# Patient Record
Sex: Female | Born: 1975 | Race: Black or African American | Hispanic: No | Marital: Single | Smoking: Former smoker
Health system: Southern US, Community
[De-identification: ages and names within clinical notes are randomized; demographics above are authoritative.]

## PROBLEM LIST (undated history)

## (undated) DIAGNOSIS — Z8619 Personal history of other infectious and parasitic diseases: Secondary | ICD-10-CM

## (undated) DIAGNOSIS — G43909 Migraine, unspecified, not intractable, without status migrainosus: Secondary | ICD-10-CM

## (undated) DIAGNOSIS — F329 Major depressive disorder, single episode, unspecified: Secondary | ICD-10-CM

## (undated) DIAGNOSIS — K219 Gastro-esophageal reflux disease without esophagitis: Secondary | ICD-10-CM

## (undated) DIAGNOSIS — T7840XA Allergy, unspecified, initial encounter: Secondary | ICD-10-CM

## (undated) DIAGNOSIS — I739 Peripheral vascular disease, unspecified: Secondary | ICD-10-CM

## (undated) DIAGNOSIS — N809 Endometriosis, unspecified: Secondary | ICD-10-CM

## (undated) HISTORY — DX: Gastro-esophageal reflux disease without esophagitis: K21.9

## (undated) HISTORY — DX: Personal history of other infectious and parasitic diseases: Z86.19

## (undated) HISTORY — DX: Major depressive disorder, single episode, unspecified: F32.9

## (undated) HISTORY — DX: Endometriosis, unspecified: N80.9

## (undated) HISTORY — PX: APPENDECTOMY: SHX54

## (undated) HISTORY — PX: BOWEL RESECTION: SHX1257

## (undated) HISTORY — DX: Migraine, unspecified, not intractable, without status migrainosus: G43.909

## (undated) HISTORY — DX: Peripheral vascular disease, unspecified: I73.9

## (undated) HISTORY — PX: OVARIAN CYST REMOVAL: SHX89

---

## 1997-11-13 ENCOUNTER — Other Ambulatory Visit: Admission: RE | Admit: 1997-11-13 | Discharge: 1997-11-13 | Payer: Self-pay | Admitting: Family Medicine

## 1998-07-12 ENCOUNTER — Other Ambulatory Visit: Admission: RE | Admit: 1998-07-12 | Discharge: 1998-07-12 | Payer: Self-pay | Admitting: *Deleted

## 1998-09-28 ENCOUNTER — Other Ambulatory Visit: Admission: RE | Admit: 1998-09-28 | Discharge: 1998-09-28 | Payer: Self-pay | Admitting: Obstetrics and Gynecology

## 1998-11-12 ENCOUNTER — Other Ambulatory Visit: Admission: RE | Admit: 1998-11-12 | Discharge: 1998-11-12 | Payer: Self-pay | Admitting: Obstetrics and Gynecology

## 1999-02-20 ENCOUNTER — Encounter (INDEPENDENT_AMBULATORY_CARE_PROVIDER_SITE_OTHER): Payer: Self-pay | Admitting: Specialist

## 1999-02-20 ENCOUNTER — Other Ambulatory Visit: Admission: RE | Admit: 1999-02-20 | Discharge: 1999-02-20 | Payer: Self-pay | Admitting: Obstetrics and Gynecology

## 2001-03-11 ENCOUNTER — Other Ambulatory Visit: Admission: RE | Admit: 2001-03-11 | Discharge: 2001-03-11 | Payer: Self-pay | Admitting: Gynecology

## 2001-08-23 ENCOUNTER — Other Ambulatory Visit: Admission: RE | Admit: 2001-08-23 | Discharge: 2001-08-23 | Payer: Self-pay | Admitting: *Deleted

## 2008-08-25 LAB — CONVERTED CEMR LAB: Pap Smear: ABNORMAL

## 2009-07-05 ENCOUNTER — Ambulatory Visit: Admission: RE | Admit: 2009-07-05 | Discharge: 2009-07-05 | Payer: Self-pay | Admitting: Gynecologic Oncology

## 2009-07-10 ENCOUNTER — Encounter: Payer: Self-pay | Admitting: Obstetrics & Gynecology

## 2009-07-10 ENCOUNTER — Inpatient Hospital Stay (HOSPITAL_COMMUNITY): Admission: RE | Admit: 2009-07-10 | Discharge: 2009-07-13 | Payer: Self-pay | Admitting: Obstetrics & Gynecology

## 2009-07-10 ENCOUNTER — Encounter: Payer: Self-pay | Admitting: Gynecology

## 2009-08-21 ENCOUNTER — Ambulatory Visit: Admission: RE | Admit: 2009-08-21 | Discharge: 2009-08-21 | Payer: Self-pay | Admitting: Gynecology

## 2009-10-18 ENCOUNTER — Ambulatory Visit: Payer: Self-pay | Admitting: Family Medicine

## 2009-10-18 DIAGNOSIS — F329 Major depressive disorder, single episode, unspecified: Secondary | ICD-10-CM

## 2009-10-18 DIAGNOSIS — N809 Endometriosis, unspecified: Secondary | ICD-10-CM | POA: Insufficient documentation

## 2009-10-18 DIAGNOSIS — G43909 Migraine, unspecified, not intractable, without status migrainosus: Secondary | ICD-10-CM | POA: Insufficient documentation

## 2009-10-18 DIAGNOSIS — I739 Peripheral vascular disease, unspecified: Secondary | ICD-10-CM

## 2009-10-18 DIAGNOSIS — F3289 Other specified depressive episodes: Secondary | ICD-10-CM

## 2009-10-18 DIAGNOSIS — K219 Gastro-esophageal reflux disease without esophagitis: Secondary | ICD-10-CM | POA: Insufficient documentation

## 2009-10-18 HISTORY — DX: Migraine, unspecified, not intractable, without status migrainosus: G43.909

## 2009-10-18 HISTORY — DX: Endometriosis, unspecified: N80.9

## 2009-10-18 HISTORY — DX: Other specified depressive episodes: F32.89

## 2009-10-18 HISTORY — DX: Peripheral vascular disease, unspecified: I73.9

## 2009-10-18 HISTORY — DX: Gastro-esophageal reflux disease without esophagitis: K21.9

## 2009-10-18 HISTORY — DX: Major depressive disorder, single episode, unspecified: F32.9

## 2009-10-19 ENCOUNTER — Ambulatory Visit: Payer: Self-pay | Admitting: Vascular Surgery

## 2009-10-19 ENCOUNTER — Telehealth: Payer: Self-pay | Admitting: Family Medicine

## 2009-10-19 ENCOUNTER — Encounter: Payer: Self-pay | Admitting: Family Medicine

## 2009-12-06 ENCOUNTER — Ambulatory Visit: Payer: Self-pay | Admitting: Surgery

## 2009-12-06 ENCOUNTER — Ambulatory Visit (HOSPITAL_COMMUNITY): Admission: RE | Admit: 2009-12-06 | Discharge: 2009-12-06 | Payer: Self-pay | Admitting: Surgery

## 2010-06-27 LAB — HM PAP SMEAR: HM Pap smear: NEGATIVE

## 2010-08-25 DIAGNOSIS — O2230 Deep phlebothrombosis in pregnancy, unspecified trimester: Secondary | ICD-10-CM

## 2010-08-25 HISTORY — DX: Deep phlebothrombosis in pregnancy, unspecified trimester: O22.30

## 2010-09-24 NOTE — Progress Notes (Signed)
Summary: [facility] update  Phone Note From Other Clinic   Caller: Referral Coordinator Summary of Call: Pt is at [facility] now, received a call that lab study was abnormal, asking permission to see Dr Arbie Cookey.  Per notes in Orders, gave verbal OK for pt to see Dr Arbie Cookey Initial call taken by: Sid Falcon LPN,  October 19, 2009 2:10 PM  Follow-up for Phone Call        noted Follow-up by: [name] MD,  October 19, 2009 2:16 PM

## 2010-09-24 NOTE — Assessment & Plan Note (Signed)
Summary: BRAND NEW PT/TO EST/CJR   Vital Signs:  Patient profile:   35 year old female Menstrual status:  irregular LMP:     10/01/2009 Height:      61.75 inches Weight:      183 pounds BMI:     33.86 Temp:     99.1 degrees F oral Pulse rate:   80 / minute Pulse rhythm:   regular Resp:     12 per minute BP sitting:   102 / 70  (left arm) Cuff size:   regular  Vitals Entered By: Sid Falcon LPN (October 18, 2009 10:32 AM)  Nutrition Counseling: Patient's BMI is greater than 25 and therefore counseled on weight management options. CC: New pt to establish, Depression LMP (date): 10/01/2009     Menstrual Status irregular Enter LMP: 10/01/2009 Last PAP Result abnormal   History of Present Illness: New patient visit to establish care.  Past medical history significant for migraine headaches, intermittent GERD symptoms. Remote history of HPV, recurrent depression, and endometriosis. Patient had bilateral ovarian cysts removed in November of 2010 and relates had ?partial bowel resection secondary to endometriosis at the same time. Reportedly had some involvement of the appendix as well  and that was removed.  Patient takes Celexa 20 mg daily for depression and is on birth control per her gynecologist. No history of any blood clot disorder. Smokes less than one half pack cigarettes per day.  Acute issue is approximately one-week history of pain and some color changes right second and fifth digits. She has noticed some purplish changes distal to the DIP joint only involving those 2 fingers. Has never had any history of Raynaud's and has not noted any white color changes. Has noted both of those digits feel cooler than the others and are painful at times. Reports remote history of heart murmur in childhood but none since. No recent chest pains.  No R arm pain.  Family history significant for father having coronary disease in his 80s. Both parents have type 2 diabetes. Father with  kidney disease presumably related to diabetes and hypertension.  Social history that she is single. No children. Attends community college. Smokes less than one half pack cigarettes per day. Occasional alcohol use.  Migraine headaches brought on by stress.  Usually relieved with Alleve. GERD exacerbated by spicy foods.  Also sig caffeine consumption.  Pepcid relieves.  Depression History:      Positive alarm features for depression include fatigue (loss of energy).  However, she denies significant weight loss, significant weight gain, psychomotor agitation, psychomotor retardation, impaired concentration (indecisiveness), and recurrent thoughts of death or suicide.        Preventive Screening-Counseling & Management  Alcohol-Tobacco     Smoking Status: current     Packs/Day: 0.25     Year Started: 1998  Allergies (verified): No Known Drug Allergies  Past History:  Family History: Last updated: 10/18/2009 Father - Type 2 DM, HTN, CAD 50s, Kidney failure, Prostate Cancer Mother -HTN, Type 2 DM  Social History: Last updated: 10/18/2009 Single Current Smoker Alcohol use-yes Attends [employer]  Risk Factors: Smoking Status: current (10/18/2009) Packs/Day: 0.25 (10/18/2009)  Past Medical History: Endometriosis  GYN Dr Stefano Gaul Migraines GERD Recurrent Depression Hx HPV Depression  Past Surgical History: Appendectomy Bil Ovarian cyst excision 2010 ?Partial bowel resection sec to endometriosis Tonsillectomy  Family History: Father - Type 2 DM, HTN, CAD 38s, Kidney failure, Prostate Cancer Mother -HTN, Type 2 DM  Social History: Single Current Smoker Alcohol  use-yes Attends [employer] Smoking Status:  current Packs/Day:  0.25  Review of Systems  The patient denies anorexia, fever, weight loss, chest pain, syncope, dyspnea on exertion, peripheral edema, prolonged cough, headaches, hemoptysis, abdominal pain, melena, hematochezia, severe indigestion/heartburn, and  hematuria.    Physical Exam  General:  Well-developed,well-nourished,in no acute distress; alert,appropriate and cooperative throughout examination Head:  Normocephalic and atraumatic without obvious abnormalities. No apparent alopecia or balding. Eyes:  pupils equal, pupils round, and pupils reactive to light.   Ears:  External ear exam shows no significant lesions or deformities.  Otoscopic examination reveals clear canals, tympanic membranes are intact bilaterally without bulging, retraction, inflammation or discharge. Hearing is grossly normal bilaterally. Mouth:  Oral mucosa and oropharynx without lesions or exudates.  Teeth in good repair. Neck:  No deformities, masses, or tenderness noted. Lungs:  Normal respiratory effort, chest expands symmetrically. Lungs are clear to auscultation, no crackles or wheezes. Heart:  Normal rate and regular rhythm. S1 and S2 normal without gallop, murmur, click, rub or other extra sounds. Msk:  No deformity or scoliosis noted of thoracic or lumbar spine.   Pulses:  R radial normal and L radial normal.   Extremities:  right hand reveals some purplish changes involving the second and fifth digits distal to the DIP joint. She has some delayed capillary refill in those digits compared to the others they're slightly cool to touch and minimally tender. Good radial pulses. Brachial pulses are 2+ bilaterally. Neurologic:  alert & oriented X3, cranial nerves II-XII intact, and strength normal in all extremities.   Skin:  Intact without suspicious lesions or rashes Psych:  normally interactive, good eye contact, not anxious appearing, and not depressed appearing.     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  ?microemboli.  No heart murmur or other suspicion of cardiac source. Pt is encouraged to stop smoking. Refer CVTS. Orders: [facility] (Vascular)  Problem # 2:   DEPRESSION (ICD-311) recurrent, cont Celexa. Her updated medication list for this problem includes:    Celexa 20 Mg Tabs (Citalopram hydrobromide) ..... Qd  Problem # 3:  MIGRAINE HEADACHE (ICD-346.90) discussed triggers. Her updated medication list for this problem includes:    Tramadol Hcl 50 Mg Tabs (Tramadol hcl) ..... Every 4-6 hours as needed pain  Problem # 4:  GERD (ICD-530.81) Discussed lifestyle modification-smoking cessation, caffeine reduction, aggravating foods. Her updated medication list for this problem includes:    Pepcid Ac 10 Mg Tabs (Famotidine) .Marland Kitchen... As needed  Problem # 5:  ENDOMETRIOSIS (ICD-617.9)  Complete Medication List: 1)  Celexa 20 Mg Tabs (Citalopram hydrobromide) .... Qd 2)  Ciprofloxacin Hcl 500 Mg Tabs (Ciprofloxacin hcl) .... Two times a day 3)  Tramadol Hcl 50 Mg Tabs (Tramadol hcl) .... Every 4-6 hours as needed pain 4)  Pepcid Ac 10 Mg Tabs (Famotidine) .... As needed   Patient Instructions: 1)  Stop smoking 2)  start 81 mg aspirin one daily 3)  We will be calling you regarding referral to vascular specialist  Preventive Care Screening  Pap Smear:    Date:  08/25/2008    Results:  abnormal   Last Tetanus Booster:    Date:  08/25/1998    Results:  Historical      Colonscopy 2010    Immunization History:  Pneumovax Immunization History:    Pneumovax:  historical (08/26/2007)

## 2010-11-13 LAB — PREGNANCY, URINE: Preg Test, Ur: NEGATIVE

## 2010-11-13 LAB — POCT I-STAT, CHEM 8
Calcium, Ion: 1.16 mmol/L (ref 1.12–1.32)
HCT: 41 % (ref 36.0–46.0)
TCO2: 22 mmol/L (ref 0–100)

## 2010-11-27 LAB — COMPREHENSIVE METABOLIC PANEL
AST: 27 U/L (ref 0–37)
Albumin: 3.4 g/dL — ABNORMAL LOW (ref 3.5–5.2)
BUN: 4 mg/dL — ABNORMAL LOW (ref 6–23)
Calcium: 9.2 mg/dL (ref 8.4–10.5)
Creatinine, Ser: 1.01 mg/dL (ref 0.4–1.2)
GFR calc Af Amer: >60 mL/min (ref >60)

## 2010-11-27 LAB — DIFFERENTIAL
Basophils Absolute: 0.1 10*3/uL (ref 0.0–0.1)
Basophils Relative: 1 % (ref 0–1)
Lymphocytes Relative: 44 % (ref 12–46)
Monocytes Relative: 7 % (ref 3–12)
Neutro Abs: 3.3 10*3/uL (ref 1.7–7.7)
Neutrophils Relative %: 46 % (ref 43–77)

## 2010-11-27 LAB — TYPE AND SCREEN
ABO/RH(D): A POS
Antibody Screen: NEGATIVE

## 2010-11-27 LAB — CBC
HCT: 40.4 % (ref 36.0–46.0)
Hemoglobin: 10.9 g/dL — ABNORMAL LOW (ref 12.0–15.0)
Hemoglobin: 9.4 g/dL — ABNORMAL LOW (ref 12.0–15.0)
MCHC: 33.8 g/dL (ref 30.0–36.0)
MCHC: 33.5 g/dL (ref 30.0–36.0)
MCV: 93.6 fL (ref 78.0–100.0)
MCV: 95.5 fL (ref 78.0–100.0)
Platelets: 202 10*3/uL (ref 150–400)
Platelets: 229 10*3/uL (ref 150–400)
RBC: 4.31 MIL/uL (ref 3.87–5.11)
RDW: 14.1 % (ref 11.5–15.5)
RDW: 14.7 % (ref 11.5–15.5)
RDW: 14.4 % (ref 11.5–15.5)
WBC: 11.5 10*3/uL — ABNORMAL HIGH (ref 4.0–10.5)

## 2010-11-27 LAB — BASIC METABOLIC PANEL
CO2: 29 mEq/L (ref 19–32)
Calcium: 8.6 mg/dL (ref 8.4–10.5)
Calcium: 8.8 mg/dL (ref 8.4–10.5)
Creatinine, Ser: 0.82 mg/dL (ref 0.4–1.2)
Creatinine, Ser: 0.84 mg/dL (ref 0.4–1.2)
GFR calc non Af Amer: >60 mL/min (ref >60)
Glucose, Bld: 122 mg/dL — ABNORMAL HIGH (ref 70–99)
Glucose, Bld: 136 mg/dL — ABNORMAL HIGH (ref 70–99)
Sodium: 137 mEq/L (ref 135–145)
Sodium: 139 mEq/L (ref 135–145)

## 2010-11-27 LAB — PREGNANCY, URINE: Preg Test, Ur: NEGATIVE

## 2010-12-09 ENCOUNTER — Emergency Department (HOSPITAL_COMMUNITY)
Admission: EM | Admit: 2010-12-09 | Discharge: 2010-12-10 | Disposition: A | Discharge status: Home / Self Care | Payer: Self-pay | Attending: Emergency Medicine | Admitting: Emergency Medicine

## 2010-12-09 ENCOUNTER — Emergency Department (HOSPITAL_COMMUNITY): Payer: Self-pay

## 2010-12-09 DIAGNOSIS — M25473 Effusion, unspecified ankle: Secondary | ICD-10-CM | POA: Insufficient documentation

## 2010-12-09 DIAGNOSIS — F17200 Nicotine dependence, unspecified, uncomplicated: Secondary | ICD-10-CM | POA: Insufficient documentation

## 2010-12-09 DIAGNOSIS — M25476 Effusion, unspecified foot: Secondary | ICD-10-CM | POA: Insufficient documentation

## 2010-12-09 DIAGNOSIS — M25579 Pain in unspecified ankle and joints of unspecified foot: Secondary | ICD-10-CM | POA: Insufficient documentation

## 2010-12-10 ENCOUNTER — Ambulatory Visit (HOSPITAL_COMMUNITY)
Admission: RE | Admit: 2010-12-10 | Discharge: 2010-12-10 | Disposition: A | Discharge status: Home / Self Care | Payer: Self-pay | Source: Ambulatory Visit | Attending: Emergency Medicine | Admitting: Emergency Medicine

## 2010-12-10 ENCOUNTER — Observation Stay (HOSPITAL_COMMUNITY)
Admission: EM | Admit: 2010-12-10 | Discharge: 2010-12-10 | Disposition: A | Discharge status: Home / Self Care | Payer: Self-pay | Attending: Emergency Medicine | Admitting: Emergency Medicine

## 2010-12-10 DIAGNOSIS — I824Z9 Acute embolism and thrombosis of unspecified deep veins of unspecified distal lower extremity: Principal | ICD-10-CM | POA: Insufficient documentation

## 2010-12-10 DIAGNOSIS — M79609 Pain in unspecified limb: Secondary | ICD-10-CM

## 2010-12-10 DIAGNOSIS — M7989 Other specified soft tissue disorders: Secondary | ICD-10-CM | POA: Insufficient documentation

## 2010-12-10 LAB — DIFFERENTIAL
Basophils Absolute: 0 10*3/uL (ref 0.0–0.1)
Basophils Relative: 0 % (ref 0–1)
Lymphocytes Relative: 33 % (ref 12–46)
Monocytes Absolute: 1.2 10*3/uL — ABNORMAL HIGH (ref 0.1–1.0)
Neutro Abs: 7.4 10*3/uL (ref 1.7–7.7)
Neutrophils Relative %: 57 % (ref 43–77)

## 2010-12-10 LAB — BASIC METABOLIC PANEL
BUN: 11 mg/dL (ref 6–23)
Chloride: 106 mEq/L (ref 96–112)
GFR calc Af Amer: >60 mL/min (ref >60)
GFR calc non Af Amer: >60 mL/min (ref >60)
Potassium: 3.7 mEq/L (ref 3.5–5.1)
Sodium: 139 mEq/L (ref 135–145)

## 2010-12-10 LAB — PROTIME-INR
INR: 1.1 (ref 0.00–1.49)
Prothrombin Time: 14.4 seconds (ref 11.6–15.2)

## 2010-12-10 LAB — CBC
HCT: 39.8 % (ref 36.0–46.0)
Hemoglobin: 13.9 g/dL (ref 12.0–15.0)
MCHC: 34.9 g/dL (ref 30.0–36.0)
RBC: 4.6 MIL/uL (ref 3.87–5.11)

## 2010-12-10 LAB — D-DIMER, QUANTITATIVE: D-Dimer, Quant: 2.9 ug/mL-FEU — ABNORMAL HIGH (ref 0.00–0.48)

## 2010-12-11 ENCOUNTER — Encounter: Payer: Self-pay | Admitting: Family Medicine

## 2010-12-12 ENCOUNTER — Encounter: Payer: Self-pay | Admitting: Family Medicine

## 2010-12-12 ENCOUNTER — Ambulatory Visit (INDEPENDENT_AMBULATORY_CARE_PROVIDER_SITE_OTHER): Payer: Self-pay | Admitting: Family Medicine

## 2010-12-12 VITALS — BP 100/78 | Temp 98.5°F | Ht 61.75 in | Wt 180.0 lb

## 2010-12-12 DIAGNOSIS — I82409 Acute embolism and thrombosis of unspecified deep veins of unspecified lower extremity: Secondary | ICD-10-CM

## 2010-12-12 LAB — POCT INR: INR: 2.6

## 2010-12-12 MED ORDER — HYDROCODONE-ACETAMINOPHEN 5-325 MG PO TABS: 1.0000 | ORAL_TABLET | Freq: Four times a day (QID) | ORAL | Status: DC | PRN

## 2010-12-12 NOTE — Progress Notes (Signed)
  Subjective:    Patient ID: Terri [name], female    DOB: 12-Apr-1976, 35 y.o.   MRN: [id number]  HPI Patient seen for post hospital followup. She relates a history that around April 6 she noticed left leg edema and pain. She tried treating this with ibuprofen with minimal improvement. Subsequently when this past Monday to [facility] confirmed left lower extremity DVT. No prior history of DVT though question of right arm embolic disease from a year ago. We had referred her to vascular surgeon and she eventually had aortic arch angiogram which showed no significant abnormalities.  Moderate pain left lower extremity. Improved with hydrocodone. Needs refills.  Patient has risk factors for coagulopathy including prior contraceptive use and smoking. Her contraceptives were being used for endometriosis and a coarse discontinued during hospitalization. She still smokes. Started Coumadin 5 mg daily with third dose today. Also taking Lovenox. No bleeding complications. Denies any shortness of breath, chest pain, or pleuritic pain. No hemoptysis.   Review of Systems  Respiratory: Negative for cough and shortness of breath.   Cardiovascular: Positive for leg swelling. Negative for chest pain and palpitations.  Gastrointestinal: Negative for abdominal pain.  Genitourinary: Negative for hematuria.  Neurological: Negative for dizziness, syncope, light-headedness and headaches.  Psychiatric/Behavioral: Negative for confusion.       Objective:   Physical Exam  Constitutional: She is oriented to person, place, and time. She appears well-developed and well-nourished.  HENT:  Head: Normocephalic and atraumatic.  Eyes: Pupils are equal, round, and reactive to light.  Neck: Neck supple. No thyromegaly present.  Cardiovascular: Normal rate, regular rhythm and normal heart sounds.   No murmur heard. Pulmonary/Chest: Breath sounds normal. No respiratory distress. She has no wheezes. She has no  rales.  Musculoskeletal: She exhibits edema and tenderness.       Patient has some edema left lower extremity compared to right she states this is diminished compared to one week ago. Minimal left calf tenderness. No color changes  Lymphadenopathy:    She has no cervical adenopathy.  Neurological: She is alert and oriented to person, place, and time. No cranial nerve deficit.          Assessment & Plan:  Patient presents with left lower extremity DVT. Recheck INR today. Patient knows not to start back contraception and will discuss with her gynecologist. She is strongly encouraged to stop smoking. Refill hydrocodone to use as needed for pain. Planned recheck in 4 days

## 2010-12-12 NOTE — Patient Instructions (Signed)
Coumadin dose- 5mg . And 2.5mg  Alternating.

## 2010-12-16 ENCOUNTER — Encounter: Payer: Self-pay | Admitting: Family Medicine

## 2010-12-16 ENCOUNTER — Ambulatory Visit (INDEPENDENT_AMBULATORY_CARE_PROVIDER_SITE_OTHER): Payer: Self-pay | Admitting: Family Medicine

## 2010-12-16 VITALS — BP 110/80 | Temp 98.5°F | Wt 184.0 lb

## 2010-12-16 DIAGNOSIS — I82409 Acute embolism and thrombosis of unspecified deep veins of unspecified lower extremity: Secondary | ICD-10-CM

## 2010-12-16 LAB — POCT INR: INR: 2

## 2010-12-16 NOTE — Patient Instructions (Addendum)
Stop Lovenox.  Continue Coumadin 2.5 mg every Monday, Wednesday, and Friday and 5 mg all other days. Follow up one week for repeat Protime.

## 2010-12-16 NOTE — Progress Notes (Signed)
  Subjective:    Patient ID: Terri [name], female    DOB: [birth year], 35 y.o.   MRN: [id number]  HPI Patient seen followed DVT left lower extremity. She was started on Coumadin 5 mg daily and after just 3 days INR 2.6. We reduced her dosage and decided to keep her on Lovenox over the weekend and plan to check INR today. No significant bleeding complications other than some mild bruising from injection site. Great improvement in left lower extremity pain which is at least 50% better than last week. She is starting to reduce her hydrocodone use. No dyspnea or pleuritic pain.   Review of Systems  Constitutional: Negative for fever and chills.  Respiratory: Negative for shortness of breath.   Cardiovascular: Negative for chest pain and palpitations.  Gastrointestinal: Negative for nausea, vomiting, diarrhea, blood in stool and anal bleeding.  Genitourinary: Negative for hematuria.  Hematological: Does not bruise/bleed easily.       Objective:   Physical Exam  Constitutional: She appears well-developed and well-nourished.  Cardiovascular: Normal rate and regular rhythm.   Pulmonary/Chest: Effort normal and breath sounds normal. No respiratory distress. She has no wheezes. She has no rales.  Musculoskeletal:       She has edema as expected left lower extremity which is stable compared to last week. Minimal left calf tenderness          Assessment & Plan:  Acute DVT left lower extremity likely related to risk factors of oral contraceptive use and smoking. The patient is still smoking but has scaled back. She is off oral contraceptives. Recheck INR today. Hopefully can discontinue her Lovenox

## 2010-12-23 ENCOUNTER — Ambulatory Visit (INDEPENDENT_AMBULATORY_CARE_PROVIDER_SITE_OTHER): Payer: Self-pay | Admitting: Family Medicine

## 2010-12-23 DIAGNOSIS — I82409 Acute embolism and thrombosis of unspecified deep veins of unspecified lower extremity: Secondary | ICD-10-CM

## 2010-12-23 NOTE — Patient Instructions (Signed)
Same dose 

## 2010-12-30 ENCOUNTER — Ambulatory Visit (INDEPENDENT_AMBULATORY_CARE_PROVIDER_SITE_OTHER): Payer: Self-pay | Admitting: Family Medicine

## 2010-12-30 ENCOUNTER — Other Ambulatory Visit: Payer: Self-pay | Admitting: *Deleted

## 2010-12-30 DIAGNOSIS — I82409 Acute embolism and thrombosis of unspecified deep veins of unspecified lower extremity: Secondary | ICD-10-CM

## 2010-12-30 MED ORDER — WARFARIN SODIUM 5 MG PO TABS: 5.0000 mg | ORAL_TABLET | Freq: Every day | ORAL | Status: DC

## 2010-12-30 NOTE — Patient Instructions (Signed)
5mg . Every day

## 2011-01-06 ENCOUNTER — Ambulatory Visit (INDEPENDENT_AMBULATORY_CARE_PROVIDER_SITE_OTHER): Payer: Self-pay | Admitting: Family Medicine

## 2011-01-06 DIAGNOSIS — I82409 Acute embolism and thrombosis of unspecified deep veins of unspecified lower extremity: Secondary | ICD-10-CM

## 2011-01-06 NOTE — Patient Instructions (Signed)
Same dose 

## 2011-01-07 NOTE — Consult Note (Signed)
NEW PATIENT CONSULTATION   Terri [name], Terri [name]  DOB:  01-13-1976                                       10/19/2009  XBMWU#:[id number]   Patient presents today for evaluation of cyanosis of her right second  and fifth finger.  She is an otherwise healthy 35 year old black female  who noted over the past several weeks to have painful tips of her second  and fifth finger and also her thumb and discoloration of her second and  fifth finger of her right hand.  She does not note any trauma to this  and does not have any repetitive motion-type activities.  She does not  have any history of prior embolic event or any cardiac disease.   Her past history is significant only for tonsillectomy in 1997.  She did  undergo endometriosis surgery with a partial bowel resection due to  involvement of endometrium in 2010.  She denies any cardiac or pulmonary  difficulty.   FAMILY HISTORY:  Positive for coronary bypass grafting in her father,  otherwise negative.   SOCIAL HISTORY:  She is married.  She does not have any children.  She  is a Consulting civil engineer.  She does have less than 1 pack of cigarettes per day  cigarettes consumption and does have social alcohol consumption.   REVIEW OF SYSTEMS:  Positive for some weight loss, weight gain, and loss  of appetite.  She is 5 feet 2 inches tall.  She weighs 180 pounds.  CARDIAC:  Negative.  PULMONARY:  Positive for productive cough.  GI:  Positive for reflux, diarrhea.  GU:  Urinary frequency.  VASCULAR:  Negative.  NEUROLOGIC:  Does have occasional headaches.  MUSCULOSKELETAL:  Occasional joint pain.  PSYCHIATRIC:  Depression and anxiety.  HEENT:  Negative.  HEMATOLOGIC:  No bleeding or clotting disorders.  She does have some  rashes on her hands from a skin standpoint.   PHYSICAL EXAMINATION:  A well-developed and well-nourished black female  appearing her stated age in no acute distress.  Blood pressure is  131/81, pulse 76,  respirations 18.  Her temperature is 97.9.  HEENT:  Normal.  Chest is clear bilaterally.  HEART:  Regular rate and rhythm  without murmurs.  Her radial and dorsalis pedis pulses are 2+  bilaterally.  Musculoskeletal shows no major deformities or cyanosis.  Neurologic:  No focal weakness or paresthesias.  Skin:  No ulcers or  rashes.  She does have cyanosis on the distal tips of her right second  and fifth finger, not on her thumb.   She underwent noninvasive vascular laboratory studies in our office, and  this revealed normal pressures and waveforms to the level of her wrist  in her radial and ulnar arteries bilaterally.  Her digital flow on the  left hand is normal.  She does have dampened digital flow in her right  second and fifth digits to correspond with the cyanosis.   I discussed this at length with the patient.  I explained that this does  appear to be embolic since it involves her second and fifth finger  versus primary occlusion in the finger itself.  I have recommended  further workup to rule out a more serious embolic source.  She will  initially undergo a 2-D echocardiogram to rule out cardiac source, and  assuming this is negative, we would  then proceed with an arch and right  arm arteriogram to rule out arch subclavian or more distal source for  emboli.  We will schedule this at her earliest convenience.     Larina Earthly, M.D.  Electronically Signed   TFE/MEDQ  D:  10/19/2009  T:  10/19/2009  Job:  [id number]   cc:   [name], M.D.

## 2011-02-06 ENCOUNTER — Ambulatory Visit (INDEPENDENT_AMBULATORY_CARE_PROVIDER_SITE_OTHER): Payer: Self-pay | Admitting: Family Medicine

## 2011-02-06 DIAGNOSIS — I82409 Acute embolism and thrombosis of unspecified deep veins of unspecified lower extremity: Secondary | ICD-10-CM

## 2011-02-06 LAB — POCT INR: INR: 1.6

## 2011-02-06 NOTE — Patient Instructions (Signed)
Take 10 mg only tomorrow then 5mg  everyday,check in 2 weeks

## 2011-02-20 ENCOUNTER — Ambulatory Visit (INDEPENDENT_AMBULATORY_CARE_PROVIDER_SITE_OTHER): Payer: Self-pay | Admitting: Family Medicine

## 2011-02-20 DIAGNOSIS — I82409 Acute embolism and thrombosis of unspecified deep veins of unspecified lower extremity: Secondary | ICD-10-CM

## 2011-02-20 LAB — POCT INR: INR: 1.9

## 2011-02-20 NOTE — Patient Instructions (Signed)
Same dose 

## 2011-03-20 ENCOUNTER — Ambulatory Visit: Payer: Self-pay

## 2011-04-02 ENCOUNTER — Ambulatory Visit (INDEPENDENT_AMBULATORY_CARE_PROVIDER_SITE_OTHER): Payer: Self-pay | Admitting: Family Medicine

## 2011-04-02 DIAGNOSIS — I82409 Acute embolism and thrombosis of unspecified deep veins of unspecified lower extremity: Secondary | ICD-10-CM

## 2011-04-02 LAB — POCT INR: INR: 1.6

## 2011-04-02 NOTE — Patient Instructions (Addendum)
7.5mg . Mondays and Fridays all other days 5mg .

## 2011-04-11 ENCOUNTER — Other Ambulatory Visit: Payer: Self-pay | Admitting: Family Medicine

## 2011-04-23 ENCOUNTER — Ambulatory Visit (INDEPENDENT_AMBULATORY_CARE_PROVIDER_SITE_OTHER): Payer: Self-pay | Admitting: Family Medicine

## 2011-04-23 DIAGNOSIS — I82409 Acute embolism and thrombosis of unspecified deep veins of unspecified lower extremity: Secondary | ICD-10-CM

## 2011-04-23 NOTE — Patient Instructions (Signed)
Same dose of coumadin 

## 2011-05-21 ENCOUNTER — Ambulatory Visit (INDEPENDENT_AMBULATORY_CARE_PROVIDER_SITE_OTHER): Payer: Self-pay | Admitting: Family Medicine

## 2011-05-21 DIAGNOSIS — I82409 Acute embolism and thrombosis of unspecified deep veins of unspecified lower extremity: Secondary | ICD-10-CM

## 2011-05-21 NOTE — Patient Instructions (Signed)
  Latest dosing instructions   Total Sun Mon Tue Wed Thu Fri Sat   40 5 mg 7.5 mg 5 mg 5 mg 5 mg 7.5 mg 5 mg    (5 mg1) (5 mg1.5) (5 mg1) (5 mg1) (5 mg1) (5 mg1.5) (5 mg1)        

## 2011-06-18 ENCOUNTER — Ambulatory Visit (INDEPENDENT_AMBULATORY_CARE_PROVIDER_SITE_OTHER): Payer: Self-pay | Admitting: Family Medicine

## 2011-06-18 DIAGNOSIS — I82409 Acute embolism and thrombosis of unspecified deep veins of unspecified lower extremity: Secondary | ICD-10-CM

## 2011-06-18 LAB — POCT INR: INR: 2.3

## 2011-06-18 NOTE — Patient Instructions (Signed)
  Latest dosing instructions   Total Sun Mon Tue Wed Thu Fri Sat   40 5 mg 7.5 mg 5 mg 5 mg 5 mg 7.5 mg 5 mg    (5 mg1) (5 mg1.5) (5 mg1) (5 mg1) (5 mg1) (5 mg1.5) (5 mg1)        

## 2011-07-22 ENCOUNTER — Other Ambulatory Visit (INDEPENDENT_AMBULATORY_CARE_PROVIDER_SITE_OTHER): Payer: Self-pay

## 2011-07-22 DIAGNOSIS — I82409 Acute embolism and thrombosis of unspecified deep veins of unspecified lower extremity: Secondary | ICD-10-CM

## 2011-07-22 DIAGNOSIS — Z0000 Encounter for general adult medical examination without abnormal findings: Secondary | ICD-10-CM

## 2011-07-22 LAB — POCT URINALYSIS DIPSTICK
Bilirubin, UA: NEGATIVE
Glucose, UA: NEGATIVE
Ketones, UA: NEGATIVE
Leukocytes, UA: NEGATIVE
Protein, UA: NEGATIVE
Spec Grav, UA: 1.025

## 2011-07-22 LAB — BASIC METABOLIC PANEL
CO2: 23 mEq/L (ref 19–32)
Chloride: 110 mEq/L (ref 96–112)
Sodium: 141 mEq/L (ref 135–145)

## 2011-07-22 LAB — CBC WITH DIFFERENTIAL/PLATELET
Basophils Relative: 0.3 % (ref 0.0–3.0)
Eosinophils Absolute: 0.2 10*3/uL (ref 0.0–0.7)
Eosinophils Relative: 2.1 % (ref 0.0–5.0)
Hemoglobin: 12 g/dL (ref 12.0–15.0)
Lymphocytes Relative: 37.4 % (ref 12.0–46.0)
MCHC: 32.4 g/dL (ref 30.0–36.0)
MCV: 85.1 fl (ref 78.0–100.0)
Neutro Abs: 5.2 10*3/uL (ref 1.4–7.7)
RBC: 4.36 Mil/uL (ref 3.87–5.11)
WBC: 9.8 10*3/uL (ref 4.5–10.5)

## 2011-07-22 LAB — HEPATIC FUNCTION PANEL
ALT: 18 U/L (ref 0–35)
Albumin: 3.7 g/dL (ref 3.5–5.2)
Alkaline Phosphatase: 80 U/L (ref 39–117)
Total Protein: 7.4 g/dL (ref 6.0–8.3)

## 2011-07-22 LAB — TSH: TSH: 1.31 u[IU]/mL (ref 0.35–5.50)

## 2011-07-22 LAB — LDL CHOLESTEROL, DIRECT: Direct LDL: 96 mg/dL

## 2011-07-22 LAB — LIPID PANEL: HDL: 35.6 mg/dL — ABNORMAL LOW (ref >39.00)

## 2011-07-22 LAB — POCT INR: INR: 2.2

## 2011-07-22 NOTE — Patient Instructions (Signed)
  Latest dosing instructions   Total Sun Mon Tue Wed Thu Fri Sat   40 5 mg 7.5 mg 5 mg 5 mg 5 mg 7.5 mg 5 mg    (5 mg1) (5 mg1.5) (5 mg1) (5 mg1) (5 mg1) (5 mg1.5) (5 mg1)        

## 2011-07-28 ENCOUNTER — Encounter: Payer: Self-pay | Admitting: Family Medicine

## 2011-07-28 ENCOUNTER — Ambulatory Visit (INDEPENDENT_AMBULATORY_CARE_PROVIDER_SITE_OTHER): Payer: Self-pay | Admitting: Family Medicine

## 2011-07-28 VITALS — BP 102/68 | HR 80 | Temp 98.4°F | Resp 12 | Ht 61.75 in | Wt 184.0 lb

## 2011-07-28 DIAGNOSIS — Z0000 Encounter for general adult medical examination without abnormal findings: Secondary | ICD-10-CM

## 2011-07-28 MED ORDER — TETANUS-DIPHTH-ACELL PERTUSSIS 5-2.5-18.5 LF-MCG/0.5 IM SUSP: 0.5000 mL | Freq: Once | INTRAMUSCULAR | Status: DC

## 2011-07-28 NOTE — Patient Instructions (Signed)
You need to quit smoking. Let me know if I can help in any way You need to establish regular exercise and work on weight loss Reduce saturated fat intake Consider omega-3 supplement 2-3 g daily

## 2011-07-28 NOTE — Progress Notes (Signed)
  Subjective:    Patient ID: Terri [name], female    DOB: 09-Oct-1975, 35 y.o.   MRN: [id number]  HPI  Complete physical. Patient sees gynecologist regularly for Pap smears. Last tetanus greater than 10 years ago. Declines flu vaccine. Smokes less than one half pack cigarettes per day. No regular exercise.  Patient had DVT lower extremity about 8 months ago. She was on oral contraception then.  Still smoking. No family history of coagulopathy. No bleeding complications on Coumadin.   Review of Systems  Constitutional: Negative for fever, activity change, appetite change, fatigue and unexpected weight change.  HENT: Negative for hearing loss, ear pain, sore throat and trouble swallowing.   Eyes: Negative for visual disturbance.  Respiratory: Negative for cough and shortness of breath.   Cardiovascular: Negative for chest pain and palpitations.  Gastrointestinal: Negative for abdominal pain, diarrhea, constipation and blood in stool.  Genitourinary: Negative for dysuria and hematuria.  Musculoskeletal: Negative for myalgias, back pain and arthralgias.  Skin: Negative for rash.  Neurological: Negative for dizziness, syncope and headaches.  Hematological: Negative for adenopathy.  Psychiatric/Behavioral: Negative for confusion and dysphoric mood.       Objective:   Physical Exam  Constitutional: She is oriented to person, place, and time. She appears well-developed and well-nourished.  HENT:  Head: Normocephalic and atraumatic.  Eyes: EOM are normal. Pupils are equal, round, and reactive to light.  Neck: Normal range of motion. Neck supple. No thyromegaly present.  Cardiovascular: Normal rate, regular rhythm and normal heart sounds.   No murmur heard. Pulmonary/Chest: Breath sounds normal. No respiratory distress. She has no wheezes. She has no rales.  Abdominal: Soft. Bowel sounds are normal. She exhibits no distension and no mass. There is no tenderness. There is no rebound  and no guarding.  Musculoskeletal: Normal range of motion. She exhibits no edema.  Lymphadenopathy:    She has no cervical adenopathy.  Neurological: She is alert and oriented to person, place, and time. She displays normal reflexes. No cranial nerve deficit.  Skin: No rash noted.  Psychiatric: She has a normal mood and affect. Her behavior is normal. Judgment and thought content normal.          Assessment & Plan:  Complete physical. Continue to [name] gynecologist. Tetanus booster given. Patient declines flu vaccine. Labs reviewed with patient. Mostly concerning for elevated triglycerides and low HDL.  Stressed importance of quitting smoking both in terms of her risk of heart disease and further blood clots. Also recommend regular aerobic exercise. Dietary factors discussed  History of DVT lower extremity DVT on contraception (at time of DVT dx).  Have recommended she try to quit smoking over the next couple months. Routine followup 3 months. Consider discontinuing Coumadin then.

## 2011-07-29 ENCOUNTER — Telehealth: Payer: Self-pay | Admitting: Family Medicine

## 2011-07-29 NOTE — Telephone Encounter (Signed)
Quit smoking. If chronic cough persists beyond one month chest x-ray

## 2011-07-29 NOTE — Telephone Encounter (Signed)
Pt was here yesterday to see Dr. Leonard Schwartz. States she forgot to tell him that whenever she wakes up, or gets hot throughout the day, she has coughing fits with a lot of clear mucus. She wants to know if this is normal, or if she can have medication for this, or what. She's a smoker, so that may be the problem. Please advise.

## 2011-07-29 NOTE — Telephone Encounter (Signed)
Pt informed and she was encouraged to schedule another OV to discuss smoking cesation options.  She will call to request CXR if cough persists > one month

## 2011-08-20 ENCOUNTER — Ambulatory Visit: Payer: Self-pay

## 2011-09-05 IMAGING — CR DG ANKLE COMPLETE 3+V*L*
3 series · 3 of 3 positions shown · non-contrast
Comparison: None.

CLINICAL DATA: Left ankle pain and swelling.  No injury.

LEFT ANKLE COMPLETE - 3+ VIEW

[view not recorded (1 of 3)]
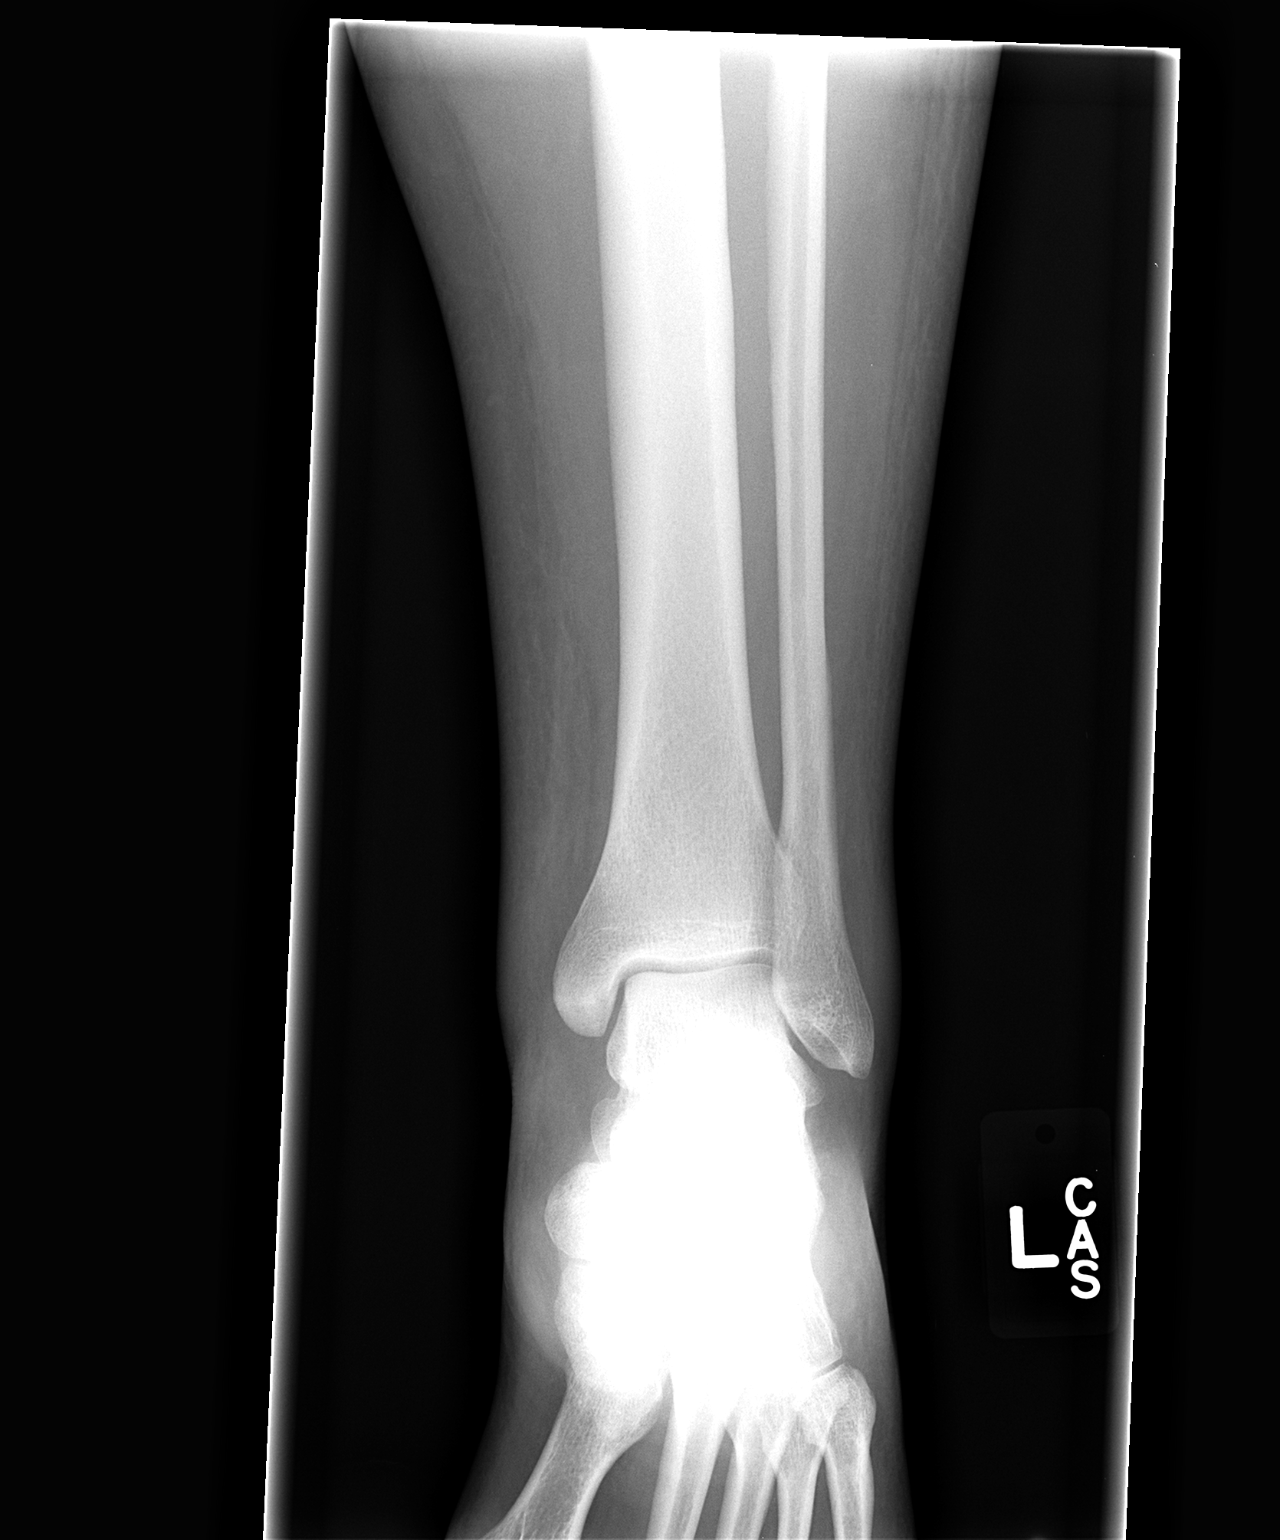

[view not recorded (2 of 3)]
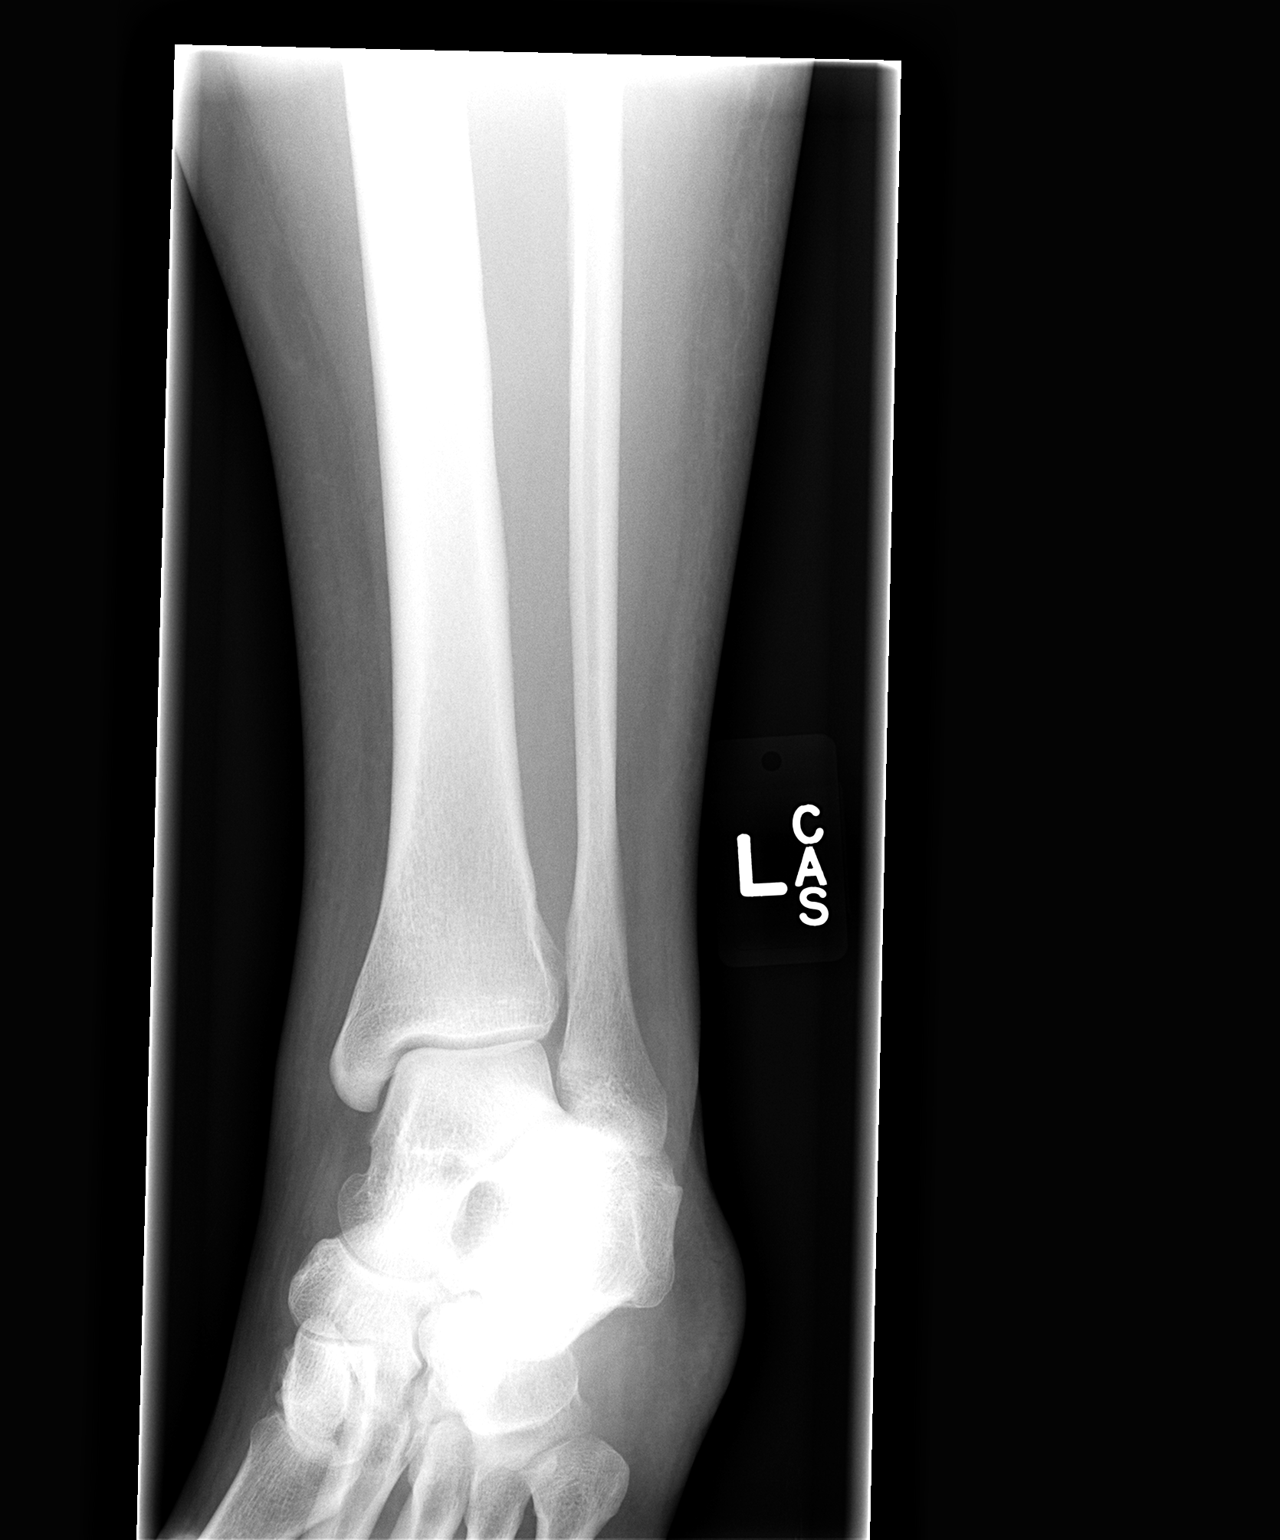

[view not recorded (3 of 3)]
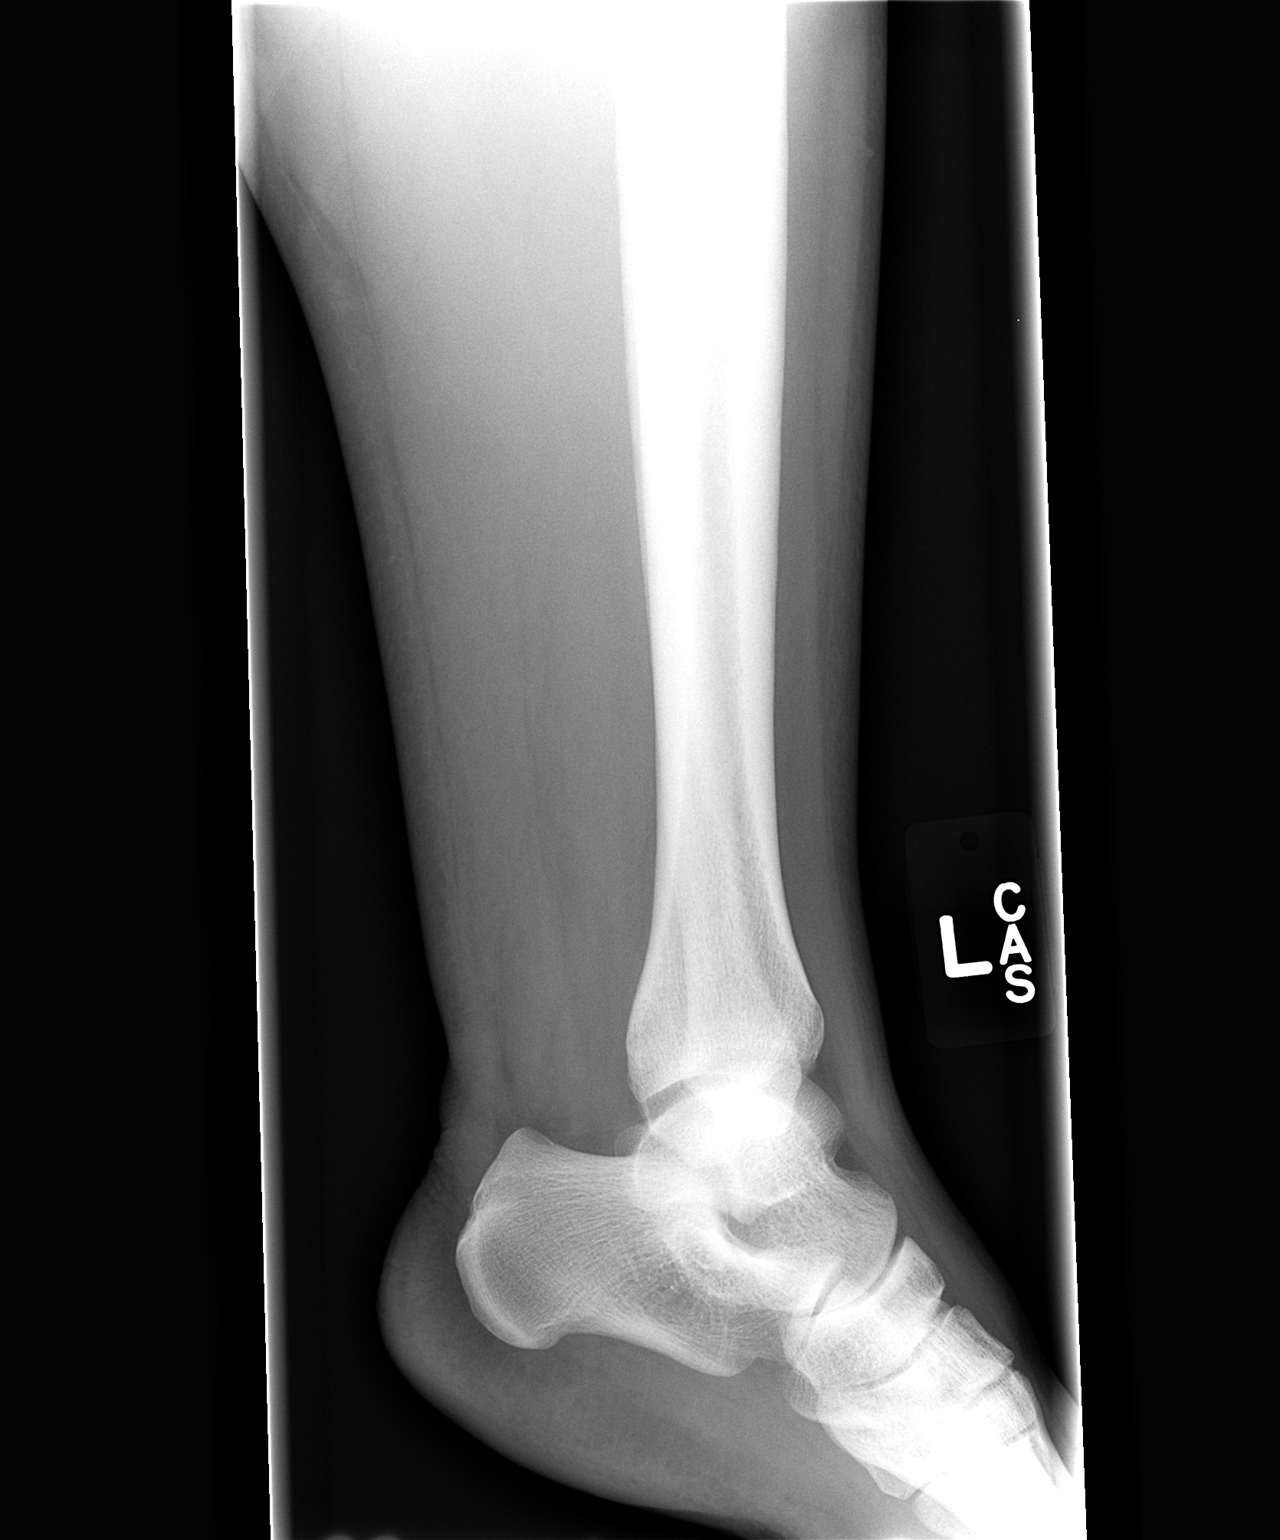

[3 of 3 positions shown; findings below may reference images not displayed]

FINDINGS: No evidence of acute fracture or subluxation.  No focal
bone lesions.  Bone matrix and cortex appear intact.  No abnormal
radiopaque densities in the soft tissues.
IMPRESSION: No acute bony abnormalities identified.

## 2011-12-02 ENCOUNTER — Encounter: Payer: Self-pay | Admitting: Obstetrics and Gynecology

## 2011-12-02 ENCOUNTER — Ambulatory Visit (INDEPENDENT_AMBULATORY_CARE_PROVIDER_SITE_OTHER): Payer: Self-pay | Admitting: Obstetrics and Gynecology

## 2011-12-02 VITALS — BP 100/78 | Resp 16 | Ht 62.0 in | Wt 190.0 lb

## 2011-12-02 DIAGNOSIS — Z124 Encounter for screening for malignant neoplasm of cervix: Secondary | ICD-10-CM

## 2011-12-02 DIAGNOSIS — N898 Other specified noninflammatory disorders of vagina: Secondary | ICD-10-CM

## 2011-12-02 DIAGNOSIS — N760 Acute vaginitis: Secondary | ICD-10-CM | POA: Insufficient documentation

## 2011-12-02 DIAGNOSIS — E669 Obesity, unspecified: Secondary | ICD-10-CM | POA: Insufficient documentation

## 2011-12-02 DIAGNOSIS — N946 Dysmenorrhea, unspecified: Secondary | ICD-10-CM

## 2011-12-02 DIAGNOSIS — Z3149 Encounter for other procreative investigation and testing: Secondary | ICD-10-CM

## 2011-12-02 MED ORDER — METRONIDAZOLE 500 MG PO TABS: 500.0000 mg | ORAL_TABLET | Freq: Two times a day (BID) | ORAL | Status: AC

## 2011-12-02 MED ORDER — CITALOPRAM HYDROBROMIDE 20 MG PO TABS: 20.0000 mg | ORAL_TABLET | Freq: Every day | ORAL | Status: DC

## 2011-12-02 MED ORDER — HYDROCODONE-ACETAMINOPHEN 5-500 MG PO TABS: 1.0000 | ORAL_TABLET | Freq: Four times a day (QID) | ORAL | Status: DC | PRN

## 2011-12-02 NOTE — Patient Instructions (Signed)
Vaginitis Vaginitis is an infection. It causes soreness, swelling, and redness (inflammation) of the vagina. Many of these infections are sexually transmitted diseases (STDs). Having unprotected sex can cause further problems and complications such as:  Chronic pelvic pain.   Infertility.   Unwanted pregnancy.   Abortion.   Tubal pregnancy.   Infection passed on to the newborn.   Cancer.  CAUSES   Monilia. This is a yeast or fungus infection, not an STD.   Bacterial vaginosis. The normal balance of bacteria in the vagina is disrupted and is replaced by an overgrowth of certain bacteria.   Gonorrhea, chlamydia. These are bacterial infections that are STDs.   Vaginal sponges, diaphragms, and intrauterine devices.   Trichomoniasis. This is a STD infection caused by a parasite.   Viruses like herpes and human papillomavirus. Both are STDs.   Pregnancy.   Immunosuppression. This occurs with certain conditions such as HIV infection or cancer.   Using bubble bath.   Taking certain antibiotic medicines.   Sporadic recurrence can occur if you become sick.   Diabetes.   Steroids.   Allergic reaction. If you have an allergy to:   Douches.   Soaps.   Spermicides.   Condoms.   Scented tampons or vaginal sprays.  SYMPTOMS   Abnormal vaginal discharge.   Itching of the vagina.   Pain in the vagina.   Swelling of the vagina.  In some cases, there are no symptoms. TREATMENT  Treatment will vary depending on the type of infection.  Bacteria or trichomonas are usually treated with oral antibiotics and sometimes vaginal cream or suppositories.   Monilia vaginitis is usually treated with vaginal creams, suppositories, or oral antifungal pills.   Viral vaginitis has no cure. However, the symptoms of herpes (a viral vaginitis) can be treated to relieve the discomfort. Human papillomavirus has no symptoms. However, there are treatments for the diseases caused by human  papillomavirus.   With allergic vaginitis, you need to stop using the product that is causing the problem. Vaginal creams can be used to treat the symptoms.   When treating an STD, the sex partner should also be treated.  HOME CARE INSTRUCTIONS   Take all the medicines as directed by your caregiver.   Do not use scented tampons, soaps, or vaginal sprays.   Do not douche.   Tell your sex partner if you have a vaginal infection or an STD.   Do not have sexual intercourse until you have treated the vaginitis.   Practice safe sex by using condoms.  SEEK MEDICAL CARE IF:   You have abdominal pain.   Your symptoms get worse during treatment.  Document Released: 06/08/2007 Document Revised: 07/31/2011 Document Reviewed: 02/01/2009 [employer] Patient Information 2012 [employer]. 

## 2011-12-02 NOTE — Progress Notes (Signed)
Subjective:    Terri [name] is a 36 y.o. female who presents for an annual exam. The patient reports:she complains of dysmenorrhea, depression, and vaginal discharge. In 2010 the patient had stage IV endometriosis.Marland KitchenMarland KitchenExplor Lap with bowel resection.   The patient is not currently sexually active.no method. The patient wears seatbelts: yes.The patient reportsno alcohol use. She participates in regular exercise: {: no.The patient reports that there is not domestic violence in her life.  Last mammogram: not applicable na   2000 Last pap: was normal and not applicable September  2010.  GC/Chlamydia cultures offered: declined HIV/RPR/HbsAg offered:  na HSV 1 and 2 glycoprotein offered: na  Menstrual cycle:   LMP: Patient's last menstrual period was 11/27/2011.           flow is moderate and Cycle is monthly with normal flow and without intermenstrual bleeding or severe dysmenorrhea  Menstrual History: OB History    Grav Para Term Preterm Abortions TAB SAB Ect Mult Living                  Menarche age:  Patient's last menstrual period was 11/27/2011.    Normal GI function.  Review of Systems Pertinent items are noted in HPI. Breast:negative for breast lumps Negative for breast lump,nipple discharge or nipple retraction Gastrointestinal: negative, Negative for abdominal pain, change in bowel habits or rectal bleeding Urinary:negative for normal    Objective:    BP 100/78  Resp 16  Ht 5\' 2"  (1.575 m)  Wt 190 lb (86.183 kg)  BMI 34.75 kg/m2  LMP 11/27/2011 Weight:  Wt Readings from Last 1 Encounters:  12/02/11 190 lb (86.183 kg)   BMI: Body mass index is 34.75 kg/(m^2). General Appearance: Alert, appropriate appearance for age. No acute distress HEENT: Grossly normal Neck / Thyroid: Supple, no masses, nodes or enlargement Lungs: clear to auscultation bilaterally Back: No CVA tenderness Breast Exam: No dimpling, nipple retraction or discharge. No masses or nodes.,  Normal to inspection, Normal breast tissue bilaterally and No masses or nodes.No dimpling, nipple retraction or discharge. Cardiovascular: Regular rate and rhythm. S1, S2, no murmur Gastrointestinal: Soft, non-tender, no masses or organomegaly Pelvic Exam: External genitalia: normal general appearance, Vag normal, cx nontender, ut ULNS nontender, adn no masses Rectovaginal: not indicated Lymphatic Exam: Non-palpable nodes in neck, clavicular, axillary, or inguinal regions Skin: no rash or abnormalities Neurologic: Normal gait and speech, no tremor  Psychiatric: Alert and oriented, appropriate affect.   Wet Prep:positive clue cells, positive WBC's, pH 5.0 Urinalysis:not applicable UPT: Not done   Assessment:    Normal gyn exam Dysmenorrhea  Endometriosis Depression Vaginosis Obesity Cigarette Smoker  Plan:    pap smear counseled on smoking cessation, weight loss, counseling for depression. RTO 6 months. STD screening: declined Contraception:no method Medications: Metronidazole, Celexa, Vicodin 5/500 #30, one refill.   Janine Limbo MD

## 2011-12-03 ENCOUNTER — Telehealth: Payer: Self-pay | Admitting: Obstetrics and Gynecology

## 2011-12-03 LAB — PAP IG W/ RFLX HPV ASCU

## 2011-12-03 NOTE — Progress Notes (Signed)
Addended by: Janine Limbo on: 12/03/2011 04:11 PM   Modules accepted: Orders

## 2011-12-04 NOTE — Telephone Encounter (Signed)
SPOKE WITH AVS ABOUT MED QUESTION FROM [facility], INFORMED PER AVS IF THEY HAVE TO ORDER THE NORCO 500/325 FOR PT HE IS OK WITH THAT IF THAT IS WHAT THEY HAVE AND IF THAT IS WHAT THE PT WANTS, [facility] VOICES UNDERSTANDING.

## 2011-12-04 NOTE — Telephone Encounter (Signed)
TC TO [facility] @ [facility], MSG SENT TO DR AVS REGARDING MEDICATION QUESTION.  AWAITING RESPONSE FROM AVS TO KNOW WHAT TO DO.

## 2011-12-16 ENCOUNTER — Other Ambulatory Visit: Payer: Self-pay | Admitting: Obstetrics and Gynecology

## 2011-12-16 NOTE — Telephone Encounter (Signed)
Routed to triage 

## 2011-12-16 NOTE — Telephone Encounter (Signed)
Spoke with pt rgd msg pt states [facility] suppose to call to clarify rx for Vicodin advised pt per phone msg on 12/04/10 avs approved name brand vicodin . Spoke with vinay at [facility] they have rx. Advised pt correct rx at pharm pt voice understanding

## 2011-12-19 ENCOUNTER — Encounter: Payer: Self-pay | Admitting: Obstetrics and Gynecology

## 2012-05-06 ENCOUNTER — Other Ambulatory Visit: Payer: Self-pay | Admitting: Obstetrics and Gynecology

## 2012-05-06 NOTE — Telephone Encounter (Signed)
TRIAGE/PAIN RX REQ

## 2012-05-06 NOTE — Telephone Encounter (Signed)
Spoke with pt rgd msg pt wants refill on Vicodin advised pt need to consult with AVS advised AVS out of office until 05/10/12 pt voice understanding

## 2012-05-12 NOTE — Telephone Encounter (Signed)
Spoke with pt rgd msg informed rx called to pharm pt voice understanding

## 2012-05-12 NOTE — Telephone Encounter (Signed)
OK to refill Vicodin 5/500 #30 One refill. 1-2 po q 4 hours as needed for pain.  Dr. Stefano Gaul

## 2012-06-22 ENCOUNTER — Encounter: Payer: Self-pay | Admitting: Obstetrics and Gynecology

## 2012-07-01 ENCOUNTER — Encounter: Payer: Self-pay | Admitting: Obstetrics and Gynecology

## 2012-07-26 ENCOUNTER — Encounter: Payer: Self-pay | Admitting: Obstetrics and Gynecology

## 2013-03-02 ENCOUNTER — Telehealth: Payer: Self-pay | Admitting: General Practice

## 2013-03-02 NOTE — Telephone Encounter (Signed)
LMOM for patient to call [name], RN @ [facility] to confirm that she is still taking coumadin.

## 2013-03-04 ENCOUNTER — Telehealth: Payer: Self-pay | Admitting: General Practice

## 2013-03-04 NOTE — Telephone Encounter (Signed)
Invalid phone number.

## 2013-03-14 ENCOUNTER — Ambulatory Visit (INDEPENDENT_AMBULATORY_CARE_PROVIDER_SITE_OTHER): Payer: Self-pay | Admitting: General Practice

## 2013-03-14 DIAGNOSIS — I82409 Acute embolism and thrombosis of unspecified deep veins of unspecified lower extremity: Secondary | ICD-10-CM

## 2019-07-04 ENCOUNTER — Other Ambulatory Visit: Payer: Self-pay

## 2019-07-04 ENCOUNTER — Encounter (HOSPITAL_COMMUNITY): Payer: Self-pay | Admitting: Emergency Medicine

## 2019-07-04 ENCOUNTER — Emergency Department (HOSPITAL_COMMUNITY)
Admission: EM | Admit: 2019-07-04 | Discharge: 2019-07-08 | Disposition: A | Discharge status: Other Acute Inpatient Hospital | Payer: Self-pay | Attending: Emergency Medicine | Admitting: Emergency Medicine

## 2019-07-04 DIAGNOSIS — F250 Schizoaffective disorder, bipolar type: Secondary | ICD-10-CM | POA: Insufficient documentation

## 2019-07-04 DIAGNOSIS — R451 Restlessness and agitation: Secondary | ICD-10-CM | POA: Insufficient documentation

## 2019-07-04 DIAGNOSIS — Z046 Encounter for general psychiatric examination, requested by authority: Secondary | ICD-10-CM | POA: Insufficient documentation

## 2019-07-04 DIAGNOSIS — Z20828 Contact with and (suspected) exposure to other viral communicable diseases: Secondary | ICD-10-CM | POA: Insufficient documentation

## 2019-07-04 DIAGNOSIS — Z79899 Other long term (current) drug therapy: Secondary | ICD-10-CM | POA: Insufficient documentation

## 2019-07-04 DIAGNOSIS — F17210 Nicotine dependence, cigarettes, uncomplicated: Secondary | ICD-10-CM | POA: Insufficient documentation

## 2019-07-04 DIAGNOSIS — F1290 Cannabis use, unspecified, uncomplicated: Secondary | ICD-10-CM | POA: Insufficient documentation

## 2019-07-04 DIAGNOSIS — F419 Anxiety disorder, unspecified: Secondary | ICD-10-CM | POA: Insufficient documentation

## 2019-07-04 DIAGNOSIS — Z7901 Long term (current) use of anticoagulants: Secondary | ICD-10-CM | POA: Insufficient documentation

## 2019-07-04 LAB — ETHANOL: Alcohol, Ethyl (B): <10 mg/dL (ref <10)

## 2019-07-04 LAB — COMPREHENSIVE METABOLIC PANEL
ALT: 17 U/L (ref 0–44)
AST: 24 U/L (ref 15–41)
Albumin: 4.3 g/dL (ref 3.5–5.0)
Alkaline Phosphatase: 60 U/L (ref 38–126)
Anion gap: 17 — ABNORMAL HIGH (ref 5–15)
BUN: 7 mg/dL (ref 6–20)
CO2: 22 mmol/L (ref 22–32)
Calcium: 10.2 mg/dL (ref 8.9–10.3)
Chloride: 99 mmol/L (ref 98–111)
Creatinine, Ser: 0.95 mg/dL (ref 0.44–1.00)
GFR calc Af Amer: >60 mL/min (ref >60)
GFR calc non Af Amer: >60 mL/min (ref >60)
Glucose, Bld: 249 mg/dL — ABNORMAL HIGH (ref 70–99)
Potassium: 3 mmol/L — ABNORMAL LOW (ref 3.5–5.1)
Sodium: 138 mmol/L (ref 135–145)
Total Bilirubin: 0.4 mg/dL (ref 0.3–1.2)
Total Protein: 8 g/dL (ref 6.5–8.1)

## 2019-07-04 LAB — CBC
HCT: 43.1 % (ref 36.0–46.0)
Hemoglobin: 14.9 g/dL (ref 12.0–15.0)
MCH: 31.4 pg (ref 26.0–34.0)
MCHC: 34.6 g/dL (ref 30.0–36.0)
MCV: 90.7 fL (ref 80.0–100.0)
Platelets: 293 10*3/uL (ref 150–400)
RBC: 4.75 MIL/uL (ref 3.87–5.11)
RDW: 12.5 % (ref 11.5–15.5)
WBC: 15.4 10*3/uL — ABNORMAL HIGH (ref 4.0–10.5)
nRBC: 0 % (ref 0.0–0.2)

## 2019-07-04 LAB — RAPID URINE DRUG SCREEN, HOSP PERFORMED
Amphetamines: NOT DETECTED
Barbiturates: NOT DETECTED
Benzodiazepines: NOT DETECTED
Cocaine: NOT DETECTED
Opiates: NOT DETECTED
Tetrahydrocannabinol: POSITIVE — AB

## 2019-07-04 LAB — I-STAT BETA HCG BLOOD, ED (MC, WL, AP ONLY): I-stat hCG, quantitative: <5 m[IU]/mL (ref <5)

## 2019-07-04 LAB — ACETAMINOPHEN LEVEL: Acetaminophen (Tylenol), Serum: <10 ug/mL — ABNORMAL LOW (ref 10–30)

## 2019-07-04 LAB — SALICYLATE LEVEL: Salicylate Lvl: <7 mg/dL (ref 2.8–30.0)

## 2019-07-04 LAB — CBG MONITORING, ED: Glucose-Capillary: 170 mg/dL — ABNORMAL HIGH (ref 70–99)
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
## 2019-07-05 ENCOUNTER — Encounter (HOSPITAL_COMMUNITY): Payer: Self-pay | Admitting: Behavioral Health

## 2019-07-05 ENCOUNTER — Other Ambulatory Visit: Payer: Self-pay

## 2019-07-05 LAB — SARS CORONAVIRUS 2 (TAT 6-24 HRS): SARS Coronavirus 2: NEGATIVE

## 2019-07-05 MED ORDER — ACETAMINOPHEN 325 MG PO TABS
325.0000 mg | ORAL_TABLET | Freq: Once | ORAL | Status: AC
  Administered 2019-07-05: 325 mg via ORAL
  Filled 2019-07-05: qty 1

## 2019-07-05 MED ORDER — POTASSIUM CHLORIDE CRYS ER 20 MEQ PO TBCR
40.0000 meq | EXTENDED_RELEASE_TABLET | Freq: Once | ORAL | Status: AC
  Administered 2019-07-05: 02:00:00 40 meq via ORAL
  Filled 2019-07-05: qty 2

## 2019-07-05 NOTE — ED Notes (Signed)
TTE in use at this time. 

## 2019-07-05 NOTE — ED Notes (Signed)
Lunch Tray Ordered @ 1102. 

## 2019-07-05 NOTE — ED Notes (Signed)
Personal belongings inventoried and stored at locker#2 at [facility].

## 2019-07-05 NOTE — ED Notes (Signed)
Pt in shower.  

## 2019-07-05 NOTE — ED Notes (Signed)
Patient requested a sandwich snack before bed.

## 2019-07-05 NOTE — ED Notes (Signed)
1st Exam noted to be completed for IVC papers - Copy faxed to [facility] - Copy sent to Medical Records - Original placed in folder for [place] 3 sets on clipboard.

## 2019-07-05 NOTE — ED Notes (Signed)
Pt continues to be restless - ambulates from room to bathroom.
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
## 2019-07-05 NOTE — ED Notes (Signed)
Breakfast ordered 

## 2019-07-05 NOTE — ED Notes (Signed)
Pt noted to be upset d/t not being able to have the same privileges as when she was admitted to [facility] for medical reasons. Advised pt d/t safety.

## 2019-07-06 MED ORDER — ACETAMINOPHEN 325 MG PO TABS
650.0000 mg | ORAL_TABLET | ORAL | Status: DC | PRN
  Administered 2019-07-06 – 2019-07-08 (×9): 650 mg via ORAL
  Filled 2019-07-06 (×10): qty 2

## 2019-07-06 NOTE — ED Notes (Signed)
Regular Diet was ordered for Lunch. 

## 2019-07-06 NOTE — ED Notes (Signed)
Pt in restroom washing up. Requested new scrubs due to spilling water on them.

## 2019-07-06 NOTE — ED Notes (Signed)
Snack and Drink was given to Patient. 

## 2019-07-06 NOTE — BHH Counselor (Signed)
Pt is being reviewed by [facility]. She can't be accepted unless her WBC has increased. Please recheck WBC.

## 2019-07-06 NOTE — Progress Notes (Signed)
Pt meets inpatient criteria per Mordecai Maes, NP. Referral information has been sent to the following hospitals for review:  [facility]  [facility]  [facility]      Disposition will continue to assist with inpatient placement needs.   Audree Camel, LCSW, [place] Disposition [facility] BHH/TTS [phone]

## 2019-07-06 NOTE — ED Notes (Signed)
Breakfast tray ordered 

## 2019-07-06 NOTE — BHH Counselor (Signed)
Reassessment- Pt's speech was tangential. Pt had flight of ideas. Pt denies SI/HI and AVH.[name], [facility] Triage Specialist

## 2019-07-07 LAB — CBC WITH DIFFERENTIAL/PLATELET
Abs Immature Granulocytes: 0.02 10*3/uL (ref 0.00–0.07)
Basophils Absolute: 0 10*3/uL (ref 0.0–0.1)
Basophils Relative: 0 %
Eosinophils Absolute: 0 10*3/uL (ref 0.0–0.5)
Eosinophils Relative: 1 %
HCT: 41.6 % (ref 36.0–46.0)
Hemoglobin: 14.6 g/dL (ref 12.0–15.0)
Immature Granulocytes: 0 %
Lymphocytes Relative: 38 %
Lymphs Abs: 3 10*3/uL (ref 0.7–4.0)
MCH: 30.9 pg (ref 26.0–34.0)
MCHC: 35.1 g/dL (ref 30.0–36.0)
MCV: 87.9 fL (ref 80.0–100.0)
Monocytes Absolute: 0.5 10*3/uL (ref 0.1–1.0)
Monocytes Relative: 7 %
Neutro Abs: 4.3 10*3/uL (ref 1.7–7.7)
Neutrophils Relative %: 54 %
Platelets: 256 10*3/uL (ref 150–400)
RBC: 4.73 MIL/uL (ref 3.87–5.11)
RDW: 12.2 % (ref 11.5–15.5)
WBC: 7.9 10*3/uL (ref 4.0–10.5)
nRBC: 0 % (ref 0.0–0.2)

## 2019-07-07 NOTE — Progress Notes (Signed)
CSW spoke with pt at RNs request. Pt voices frustration that she has been at the hospital for three days and that no one is telling her what is going on. CSW validated her feelings and explained that Disposition is actively looking for appropriate psychiatric treatment.   CSW notified RN that [facility] is reviewing pt and that their provider has requested a repeat CBC to check WBC count.   Audree Camel, LCSW, [place] Disposition [facility] BHH/TTS [phone]

## 2019-07-07 NOTE — ED Notes (Signed)
RN informed [place] that due to patient IVC status she will be transported by [facility] after shift change in the [facility]

## 2019-07-07 NOTE — BH Assessment (Signed)
Patient has been accepted to [facility].  Accepting physician is Dr. Sheliah Mends.  Attending Physician will be Dr. Weber Cooks.  Patient has been assigned to room 324, by [place] Charge Nurse Demetria.  Call report to [phone].  Representative/Transfer Coordinator is Madelline Eshbach Patient pre-admitted by [facility] Patient Access (Renita).

## 2019-07-07 NOTE — ED Notes (Signed)
This nurse informed by social work that pt needs repeat CBC for placement d/t WBC count. This nurse to notify PA for orders.

## 2019-07-07 NOTE — ED Notes (Signed)
Tele psych machine at bedside, pt speaking with TTS

## 2019-07-07 NOTE — ED Notes (Signed)
Attempted to call nursing report to [facility], informed that it is change of shift and nurse will not be available to take report till 1930, will pass on to oncoming shift.

## 2019-07-07 NOTE — ED Notes (Signed)
IVC paperwork faxed to [facility] per social work request.

## 2019-07-07 NOTE — ED Notes (Addendum)
This nurse called back to [facility] assessment office, unable to reach provider, but spoke with social work, who is open to speak with pt, pt up to nurses station to use phone to speak with social work, pt taking notes on paper and restless while talking on the phone to social work

## 2019-07-07 NOTE — ED Notes (Signed)
Pt becoming increasingly anxious and restless,  Difficulty following simple directions. Tangential thought process.  requesting to speak to psych provide related to her POC, tells this nurse she has not talked to anyone. encouragement and support provided, this nurse called [facility] assessment office to try and reach psych provider, no answer. Will try again

## 2019-07-07 NOTE — ED Notes (Signed)
Pt taking a shower 

## 2019-07-07 NOTE — ED Notes (Signed)
Breakfast ordered 

## 2019-07-08 ENCOUNTER — Other Ambulatory Visit: Payer: Self-pay

## 2019-07-08 ENCOUNTER — Inpatient Hospital Stay
Admission: RE | Admit: 2019-07-08 | Discharge: 2019-07-12 | DRG: 885 | Disposition: A | Discharge status: Home / Self Care | Payer: No Typology Code available for payment source | Source: Intra-hospital | Attending: Psychiatry | Admitting: Psychiatry

## 2019-07-08 DIAGNOSIS — Z7984 Long term (current) use of oral hypoglycemic drugs: Secondary | ICD-10-CM

## 2019-07-08 DIAGNOSIS — G4700 Insomnia, unspecified: Secondary | ICD-10-CM | POA: Diagnosis present

## 2019-07-08 DIAGNOSIS — F3160 Bipolar disorder, current episode mixed, unspecified: Secondary | ICD-10-CM | POA: Diagnosis present

## 2019-07-08 DIAGNOSIS — F17210 Nicotine dependence, cigarettes, uncomplicated: Secondary | ICD-10-CM | POA: Diagnosis present

## 2019-07-08 DIAGNOSIS — E119 Type 2 diabetes mellitus without complications: Secondary | ICD-10-CM | POA: Diagnosis present

## 2019-07-08 DIAGNOSIS — Z7289 Other problems related to lifestyle: Secondary | ICD-10-CM

## 2019-07-08 DIAGNOSIS — Z23 Encounter for immunization: Secondary | ICD-10-CM

## 2019-07-08 DIAGNOSIS — F1210 Cannabis abuse, uncomplicated: Secondary | ICD-10-CM

## 2019-07-08 DIAGNOSIS — F259 Schizoaffective disorder, unspecified: Secondary | ICD-10-CM | POA: Insufficient documentation

## 2019-07-08 LAB — HEMOGLOBIN A1C
Hgb A1c MFr Bld: 8.5 % — ABNORMAL HIGH (ref 4.8–5.6)
Mean Plasma Glucose: 197.25 mg/dL

## 2019-07-08 LAB — TSH: TSH: 0.602 u[IU]/mL (ref 0.350–4.500)

## 2019-07-08 MED ORDER — OLANZAPINE 5 MG PO TBDP
5.0000 mg | ORAL_TABLET | Freq: Every day | ORAL | Status: DC
  Administered 2019-07-08 – 2019-07-09 (×2): 5 mg via ORAL
  Filled 2019-07-08 (×3): qty 1

## 2019-07-08 MED ORDER — PNEUMOCOCCAL VAC POLYVALENT 25 MCG/0.5ML IJ INJ
0.5000 mL | INJECTION | INTRAMUSCULAR | Status: AC
  Administered 2019-07-12: 0.5 mL via INTRAMUSCULAR
  Filled 2019-07-08 (×2): qty 0.5

## 2019-07-08 MED ORDER — MAGNESIUM HYDROXIDE 400 MG/5ML PO SUSP: 30.0000 mL | Freq: Every day | ORAL | Status: DC | PRN

## 2019-07-08 MED ORDER — ALUM & MAG HYDROXIDE-SIMETH 200-200-20 MG/5ML PO SUSP: 30.0000 mL | ORAL | Status: DC | PRN

## 2019-07-08 MED ORDER — INFLUENZA VAC SPLIT QUAD 0.5 ML IM SUSY
0.5000 mL | PREFILLED_SYRINGE | INTRAMUSCULAR | Status: AC
  Administered 2019-07-12: 0.5 mL via INTRAMUSCULAR
  Filled 2019-07-08: qty 0.5
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
## 2019-07-08 NOTE — ED Notes (Signed)
Spoke with counselor at [facility], nurses are unable to taker report at this time.
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
## 2019-07-08 NOTE — BHH Group Notes (Signed)
Feelings Around Relapse 07/08/2019 1PM  Type of Therapy and Topic:  Group Therapy:  Feelings around Relapse and Recovery  Participation Level:  Did Not Attend   Description of Group:    Patients in this group will discuss emotions they experience before and after a relapse. They will process how experiencing these feelings, or avoidance of experiencing them, relates to having a relapse. Facilitator will guide patients to explore emotions they have related to recovery. Patients will be encouraged to process which emotions are more powerful. They will be guided to discuss the emotional reaction significant others in their lives may have to patients' relapse or recovery. Patients will be assisted in exploring ways to respond to the emotions of others without this contributing to a relapse.  Therapeutic Goals: 1. Patient will identify two or more emotions that lead to a relapse for them 2. Patient will identify two emotions that result when they relapse 3. Patient will identify two emotions related to recovery 4. Patient will demonstrate ability to communicate their needs through discussion and/or role plays   Summary of Patient Progress:     Therapeutic Modalities:   Cognitive Behavioral Therapy Solution-Focused Therapy Assertiveness Training Relapse Prevention Therapy   Yvette Rack, LCSW 07/08/2019 2:30 PM

## 2019-07-08 NOTE — Plan of Care (Signed)
New admission.   Problem: Education: Goal: Knowledge of [facility] [place] [facility] Education information/materials will improve Outcome: Not Progressing Goal: Emotional status will improve Outcome: Not Progressing Goal: Mental status will improve Outcome: Not Progressing Goal: Verbalization of understanding the information provided will improve Outcome: Not Progressing   Problem: Safety: Goal: Periods of time without injury will increase Outcome: Not Progressing   Problem: Coping: Goal: Coping ability will improve Outcome: Not Progressing   Problem: Safety: Goal: Ability to disclose and discuss suicidal ideas will improve Outcome: Not Progressing   Problem: Self-Concept: Goal: Level of anxiety will decrease Outcome: Not Progressing

## 2019-07-08 NOTE — ED Notes (Signed)
Attempted to call report.  No answer at number charted x 2.

## 2019-07-08 NOTE — Tx Team (Signed)
Initial Treatment Plan 07/08/2019 5:57 PM Terri [name] ZSW:[id number]    PATIENT STRESSORS: Marital or family conflict Medication change or noncompliance Substance abuse   PATIENT STRENGTHS: Curator fund of knowledge Religious Affiliation   PATIENT IDENTIFIED PROBLEMS: Aggressive behavior  Depression  Substance abuse                 DISCHARGE CRITERIA:  Ability to meet basic life and health needs Improved stabilization in mood, thinking, and/or behavior Need for constant or close observation no longer present Reduction of life-threatening or endangering symptoms to within safe limits  PRELIMINARY DISCHARGE PLAN: Outpatient therapy Return to previous living arrangement  PATIENT/FAMILY INVOLVEMENT: This treatment plan has been presented to and reviewed with the patient, Terri [name]. The patient has been given the opportunity to ask questions and make suggestions.  Makail Watling, RN 07/08/2019, 5:57 PM

## 2019-07-08 NOTE — ED Notes (Signed)
Spoke with [facility] and they are unable to take report at this time, Gave my name and number to Coralyn Mark and he will give to charge nurse to call when they are able to accept this patient.
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
## 2019-07-08 NOTE — ED Notes (Signed)
Spoke with [facility] department Transfer and verified information is completed. Will call when they are on there way.

## 2019-07-09 LAB — LIPID PANEL
Cholesterol: 256 mg/dL — ABNORMAL HIGH (ref 0–200)
HDL: 31 mg/dL — ABNORMAL LOW (ref >40)
LDL Cholesterol: UNDETERMINED mg/dL (ref 0–99)
Total CHOL/HDL Ratio: 8.3 RATIO
Triglycerides: 492 mg/dL — ABNORMAL HIGH (ref <150)
VLDL: UNDETERMINED mg/dL (ref 0–40)

## 2019-07-09 LAB — HEPATIC FUNCTION PANEL
ALT: 16 U/L (ref 0–44)
AST: 20 U/L (ref 15–41)
Albumin: 4.5 g/dL (ref 3.5–5.0)
Alkaline Phosphatase: 57 U/L (ref 38–126)
Bilirubin, Direct: <0.1 mg/dL (ref 0.0–0.2)
Total Bilirubin: 0.6 mg/dL (ref 0.3–1.2)
Total Protein: 8.3 g/dL — ABNORMAL HIGH (ref 6.5–8.1)

## 2019-07-09 LAB — TSH: TSH: 1.324 u[IU]/mL (ref 0.350–4.500)

## 2019-07-09 LAB — LDL CHOLESTEROL, DIRECT: Direct LDL: 150.7 mg/dL — ABNORMAL HIGH (ref 0–99)

## 2019-07-09 MED ORDER — METFORMIN HCL 500 MG PO TABS
500.0000 mg | ORAL_TABLET | Freq: Every day | ORAL | Status: DC
  Administered 2019-07-10: 500 mg via ORAL
  Filled 2019-07-09: qty 1

## 2019-07-09 MED ORDER — IBUPROFEN 200 MG PO TABS
400.0000 mg | ORAL_TABLET | Freq: Four times a day (QID) | ORAL | Status: DC | PRN
  Administered 2019-07-09 – 2019-07-12 (×4): 400 mg via ORAL
  Filled 2019-07-09 (×4): qty 2

## 2019-07-09 MED ORDER — ACETAMINOPHEN 325 MG PO TABS
650.0000 mg | ORAL_TABLET | Freq: Four times a day (QID) | ORAL | Status: DC | PRN
  Administered 2019-07-09 – 2019-07-12 (×5): 650 mg via ORAL
  Filled 2019-07-09 (×5): qty 2

## 2019-07-09 NOTE — Progress Notes (Signed)
D: Patient has been calm and cooperative. Denies SI, HI and AVH.  A: continue to monitor for safety R: Safety maintained.  

## 2019-07-09 NOTE — Progress Notes (Signed)
Pt has been calm and cooperative today. Pt has been  pleasant, social and went outdoors. Collier Bullock RN

## 2019-07-09 NOTE — Plan of Care (Signed)
Pt rates depression 4/10 and anxiety 6/10. Pt denies SI, HI and AVH. Pt was educated on care plan and verbalizes understanding. Collier Bullock RN Problem: Education: Goal: Freight forwarder Education information/materials will improve Outcome: Not Progressing Goal: Emotional status will improve Outcome: Not Progressing Goal: Mental status will improve Outcome: Not Progressing Goal: Verbalization of understanding the information provided will improve Outcome: Not Progressing   Problem: Safety: Goal: Periods of time without injury will increase Outcome: Not Progressing   Problem: Coping: Goal: Coping ability will improve Outcome: Not Progressing   Problem: Safety: Goal: Ability to disclose and discuss suicidal ideas will improve Outcome: Not Progressing   Problem: Self-Concept: Goal: Level of anxiety will decrease Outcome: Not Progressing

## 2019-07-09 NOTE — Plan of Care (Signed)
  Problem: Safety: Goal: Periods of time without injury will increase Outcome: Progressing  D: Patient has been calm and cooperative. Denies SI, HI and AVH A: continue to monitor for safety R: Safety maintained

## 2019-07-09 NOTE — Tx Team (Signed)
Interdisciplinary Treatment and Diagnostic Plan Update  07/09/2019 Time of Session: 9am [name] MRN: [id number]  Principal Diagnosis: Mixed bipolar I disorder ([place])  Secondary Diagnoses: Principal Problem:   Mixed bipolar I disorder ([name]) Active Problems:   Cannabis abuse   Current Medications:  Current Facility-Administered Medications  Medication Dose Route Frequency Provider Last Rate Last Dose  . alum & mag hydroxide-simeth (MAALOX/MYLANTA) 200-200-20 MG/5ML suspension 30 mL  30 mL Oral Q4H PRN Cristofano, Paul A, MD      . influenza vac split quadrivalent PF (FLUARIX) injection 0.5 mL  0.5 mL Intramuscular Tomorrow-1000 Clapacs, John T, MD      . magnesium hydroxide (MILK OF MAGNESIA) suspension 30 mL  30 mL Oral Daily PRN Cristofano, Paul A, MD      . OLANZapine zydis (ZYPREXA) disintegrating tablet 5 mg  5 mg Oral QHS Clapacs, Madie Reno, MD   5 mg at 07/08/19 2122  . pneumococcal 23 valent vaccine (PNEUMOVAX-23) injection 0.5 mL  0.5 mL Intramuscular Tomorrow-1000 Clapacs, Madie Reno, MD       PTA Medications: Facility-Administered Medications Prior to Admission  Medication Dose Route Frequency Provider Last Rate Last Dose  . TDaP (BOOSTRIX) injection 0.5 mL  0.5 mL Intramuscular Once Eulas Post, MD       Medications Prior to Admission  Medication Sig Dispense Refill Last Dose  . acetaminophen (TYLENOL) 500 MG tablet Take 1,000 mg by mouth every 6 (six) hours as needed for mild pain.   prn at prn  . Probiotic Product (PROBIOTIC PO) Take 1 capsule by mouth daily.   Past Week at Unknown time  . Pseudoeph-Doxylamine-DM-APAP (NYQUIL PO) Take 30 mLs by mouth daily as needed (cold symptoms).   prn at prn    Patient Stressors: Marital or family conflict Medication change or noncompliance Substance abuse  Patient Strengths: Curator fund of knowledge Religious Affiliation  Treatment Modalities: Medication Management, Group therapy, Case  management,  1 to 1 session with clinician, Psychoeducation, Recreational therapy.   Physician Treatment Plan for Primary Diagnosis: Mixed bipolar I disorder ([name]) Long Term Goal(s): Improvement in symptoms so as ready for discharge Improvement in symptoms so as ready for discharge   Short Term Goals: Ability to verbalize feelings will improve Ability to demonstrate self-control will improve Ability to demonstrate self-control will improve Ability to identify triggers associated with substance abuse/mental health issues will improve  Medication Management: Evaluate patient's response, side effects, and tolerance of medication regimen.  Therapeutic Interventions: 1 to 1 sessions, Unit Group sessions and Medication administration.  Evaluation of Outcomes: Not Met  Physician Treatment Plan for Secondary Diagnosis: Principal Problem:   Mixed bipolar I disorder ([name]) Active Problems:   Cannabis abuse  Long Term Goal(s): Improvement in symptoms so as ready for discharge Improvement in symptoms so as ready for discharge   Short Term Goals: Ability to verbalize feelings will improve Ability to demonstrate self-control will improve Ability to demonstrate self-control will improve Ability to identify triggers associated with substance abuse/mental health issues will improve     Medication Management: Evaluate patient's response, side effects, and tolerance of medication regimen.  Therapeutic Interventions: 1 to 1 sessions, Unit Group sessions and Medication administration.  Evaluation of Outcomes: Not Met   RN Treatment Plan for Primary Diagnosis: Mixed bipolar I disorder ([name]) Long Term Goal(s): Knowledge of disease and therapeutic regimen to maintain health will improve  Short Term Goals: Ability to participate in decision making will improve, Ability to identify and  develop effective coping behaviors will improve and Compliance with prescribed medications will improve  Medication  Management: RN will administer medications as ordered by provider, will assess and evaluate patient's response and provide education to patient for prescribed medication. RN will report any adverse and/or side effects to prescribing provider.  Therapeutic Interventions: 1 on 1 counseling sessions, Psychoeducation, Medication administration, Evaluate responses to treatment, Monitor vital signs and CBGs as ordered, Perform/monitor CIWA, COWS, AIMS and Fall Risk screenings as ordered, Perform wound care treatments as ordered.  Evaluation of Outcomes: Not Met   LCSW Treatment Plan for Primary Diagnosis: Mixed bipolar I disorder ([name]) Long Term Goal(s): Safe transition to appropriate next level of care at discharge, Engage patient in therapeutic group addressing interpersonal concerns.  Short Term Goals: Engage patient in aftercare planning with referrals and resources  Therapeutic Interventions: Assess for all discharge needs, 1 to 1 time with Social worker, Explore available resources and support systems, Assess for adequacy in community support network, Educate family and significant other(s) on suicide prevention, Complete Psychosocial Assessment, Interpersonal group therapy.  Evaluation of Outcomes: Not Met   Progress in Treatment: Attending groups: No. Participating in groups: No. Taking medication as prescribed: Yes. Toleration medication: Yes. Family/Significant other contact made: No, will contact:  when pt gives consent Patient understands diagnosis: Yes. Discussing patient identified problems/goals with staff: Yes. Medical problems stabilized or resolved: No. Denies suicidal/homicidal ideation: Yes. Issues/concerns per patient self-inventory: No. Other: NA  New problem(s) identified: No, Describe:  none reported  New Short Term/Long Term Goal(s):Attend outpatient treatment, take medication as prescribed, develop and implement healthy coping methods  Patient Goals:  "get the  right diagnosis, right medication and get home"  Discharge Plan or Barriers: Pt will return home and follow up with outpatient treatment  Reason for Continuation of Hospitalization: Medication stabilization  Estimated Length of Stay:1-7 days  Attendees: Patient:Terri [name] 07/09/2019 11:18 AM  Physician: Myles Lipps 07/09/2019 11:18 AM  Nursing:  07/09/2019 11:18 AM  RN Care Manager: 07/09/2019 11:18 AM  Social Worker: Sanjuana Kava 07/09/2019 11:18 AM  Recreational Therapist:  07/09/2019 11:18 AM  Other:  07/09/2019 11:18 AM  Other:  07/09/2019 11:18 AM  Other: 07/09/2019 11:18 AM    Scribe for Treatment Team: Yvette Rack, LCSW 07/09/2019 11:18 AM
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
## 2019-07-09 NOTE — BHH Group Notes (Signed)
[facility] Group Notes:  (Nursing/MHT/Case Management/Adjunct)  Date:  07/09/2019  Time:  9:16 PM  Type of Therapy:  Group Therapy  Participation Level:  Active  Participation Quality:  Appropriate  Affect:  Appropriate  Cognitive:  Alert  Insight:  Good  Engagement in Group:  Engaged  Modes of Intervention:  Support  Summary of Progress/Problems:  Terri [name] 07/09/2019, 9:16 PM

## 2019-07-10 MED ORDER — OLANZAPINE 5 MG PO TBDP
10.0000 mg | ORAL_TABLET | Freq: Every day | ORAL | Status: DC
  Administered 2019-07-10 – 2019-07-11 (×2): 10 mg via ORAL
  Filled 2019-07-10: qty 2

## 2019-07-10 MED ORDER — METFORMIN HCL 500 MG PO TABS
500.0000 mg | ORAL_TABLET | Freq: Two times a day (BID) | ORAL | Status: DC
  Administered 2019-07-10 – 2019-07-12 (×4): 500 mg via ORAL
  Filled 2019-07-10 (×4): qty 1

## 2019-07-10 NOTE — Progress Notes (Signed)
D: Patient has been socializing on the unit with peers. Denies SI, HI and AVH. Medication teaching done. Asking appropriate questions about medication.  A: Continue to monitor for safety R: Safety maintained.

## 2019-07-10 NOTE — Progress Notes (Signed)
Pt rates depression and anxiety both 2/10. Pt denies SI, HI and AVH. Pt was educated on care plan and and verbalizes understanding. Collier Bullock RN

## 2019-07-10 NOTE — BHH Group Notes (Signed)
LCSW Group Therapy Note 07/10/2019 1:15pm  Type of Therapy and Topic: Group Therapy: Feelings Around Returning Home & Establishing a Supportive Framework and Supporting Oneself When Supports Not Available  Participation Level: Active  Description of Group:  Patients first processed thoughts and feelings about upcoming discharge. These included fears of upcoming changes, lack of change, new living environments, judgements and expectations from others and overall stigma of mental health issues. The group then discussed the definition of a supportive framework, what that looks and feels like, and how do to discern it from an unhealthy non-supportive network. The group identified different types of supports as well as what to do when your family/friends are less than helpful or unavailable  Therapeutic Goals  1. Patient will identify one healthy supportive network that they can use at discharge. 2. Patient will identify one factor of a supportive framework and how to tell it from an unhealthy network. 3. Patient able to identify one coping skill to use when they do not have positive supports from others. 4. Patient will demonstrate ability to communicate their needs through discussion and/or role plays.  Summary of Patient Progress:  The patient reported she feels sleepy. Pt engaged during group session. As patients processed their anxiety about discharge and described healthy supports patient shared she is ready to be discharge.  Patients identified at least one self-care tool they were willing to use after discharge.   Therapeutic Modalities Cognitive Behavioral Therapy Motivational Interviewing   Terri [name]  CUEBAS-COLON, LCSW 07/10/2019 12:21 PM
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
## 2019-07-10 NOTE — BHH Suicide Risk Assessment (Signed)
[name] INPATIENT:  Family/Significant Other Suicide Prevention Education  Suicide Prevention Education:  Patient Refusal for Family/Significant Other Suicide Prevention Education: The patient Terri [name] has refused to provide written consent for family/significant other to be provided Family/Significant Other Suicide Prevention Education during admission and/or prior to discharge.  Physician notified.  Hue Steveson  CUEBAS-COLON 07/10/2019, 4:38 PM

## 2019-07-10 NOTE — Plan of Care (Signed)
  Problem: Education: Goal: Knowledge of [facility] Education information/materials will improve Outcome: Progressing Goal: Emotional status will improve Outcome: Progressing Goal: Mental status will improve Outcome: Progressing Goal: Verbalization of understanding the information provided will improve Outcome: Progressing  D: Patient has been socializing on the unit with peers. Denies SI, HI and AVH. Medication teaching done. Asking appropriate questions about medication.  A: Continue to monitor for safety R: Safety maintained.

## 2019-07-10 NOTE — Progress Notes (Signed)
Pt has been calm, cooperative and social today, Pt has been very pleasant. Collier Bullock RN

## 2019-07-11 MED ORDER — OLANZAPINE 10 MG PO TBDP: 10.0000 mg | ORAL_TABLET | Freq: Every day | ORAL | 0 refills | Status: DC

## 2019-07-11 MED ORDER — METFORMIN HCL 500 MG PO TABS: 500.0000 mg | ORAL_TABLET | Freq: Two times a day (BID) | ORAL | 0 refills | Status: DC

## 2019-07-11 NOTE — Progress Notes (Signed)
[facility]  07/11/2019 6:06 PM Terri [name]  MRN:  [id number] Subjective: Follow-up for this patient with a history of bipolar disorder.  Patient has been calm and cooperative on the unit.  She says that her mother visited over the weekend and they had a good visit.  She felt very ashamed when she saw the bruise on her mother's face.  Patient has been compliant with medicine.  Says she is no longer thinking at all about the money that she was delusional about previously.  Patient presents as calm with a much more appropriate affect.  No physical complaints.  Tolerating medicine fine. Principal Problem: Mixed bipolar I disorder (HCC) Diagnosis: Principal Problem:   Mixed bipolar I disorder (HCC) Active Problems:   Cannabis abuse  Total Time spent with patient: 30 minutes  Past Psychiatric History: Past history of bipolar disorder some history of noncompliance  Past Medical History:  Past Medical History:  Diagnosis Date  . DEPRESSION 10/18/2009  . ENDOMETRIOSIS 10/18/2009  . GERD 10/18/2009  . History of HPV infection   . MIGRAINE HEADACHE 10/18/2009  . UNSPECIFIED PERIPHERAL VASCULAR DISEASE 10/18/2009    Past Surgical History:  Procedure Laterality Date  . APPENDECTOMY    . BOWEL RESECTION     r/t endometriosis  . OVARIAN CYST REMOVAL     bilateral   Family History:  Family History  Problem Relation Age of Onset  . Hyperlipidemia Mother   . Diabetes Mother   . Diabetes Father   . Hypertension Father   . Heart disease Father [id number]       CABG  . Kidney disease Father   . Prostate cancer Father    Family Psychiatric  History: See previous Social History:  Social History   Substance and Sexual Activity  Alcohol Use Yes     Social History   Substance and Sexual Activity  Drug Use Yes  . Types: Marijuana   Comment: undetermined amount    Social History   Socioeconomic History  . Marital status: Single    Spouse name: Not on file  . Number of  children: Not on file  . Years of education: Not on file  . Highest education level: Not on file  Occupational History  . Not on file  Social Needs  . Financial resource strain: Not on file  . Food insecurity    Worry: Not on file    Inability: Not on file  . Transportation needs    Medical: Not on file    Non-medical: Not on file  Tobacco Use  . Smoking status: Current Some Day Smoker    Packs/day: 0.50    Years: 15.00    Pack years: 7.50    Types: Cigarettes  . Smokeless tobacco: Never Used  Substance and Sexual Activity  . Alcohol use: Yes  . Drug use: Yes    Types: Marijuana    Comment: undetermined amount  . Sexual activity: Never    Birth control/protection: None  Lifestyle  . Physical activity    Days per week: Not on file    Minutes per session: Not on file  . Stress: Not on file  Relationships  . Social Musicianconnections    Talks on phone: Not on file    Gets together: Not on file    Attends religious service: Not on file    Active member of club or organization: Not on file    Attends meetings of clubs or organizations: Not on file  Relationship status: Not on file  Other Topics Concern  . Not on file  Social History Narrative  . Not on file   Additional Social History:    Pain Medications: see PTA Prescriptions: see PTA Over the Counter: see PTA History of alcohol / drug use?: Yes Longest period of sobriety (when/how long): Unknown                    Sleep: Fair  Appetite:  Fair  Current Medications: Current Facility-Administered Medications  Medication Dose Route Frequency Provider Last Rate Last Dose  . acetaminophen (TYLENOL) tablet 650 mg  650 mg Oral Q6H PRN Sharma Covert, MD   650 mg at 07/11/19 1509  . alum & mag hydroxide-simeth (MAALOX/MYLANTA) 200-200-20 MG/5ML suspension 30 mL  30 mL Oral Q4H PRN Cristofano, Paul A, MD      . ibuprofen (ADVIL) tablet 400 mg  400 mg Oral Q6H PRN Sharma Covert, MD   400 mg at 07/10/19  2121  . influenza vac split quadrivalent PF (FLUARIX) injection 0.5 mL  0.5 mL Intramuscular Tomorrow-1000 ,  T, MD      . magnesium hydroxide (MILK OF MAGNESIA) suspension 30 mL  30 mL Oral Daily PRN Cristofano, Paul A, MD      . metFORMIN (GLUCOPHAGE) tablet 500 mg  500 mg Oral BID WC Sharma Covert, MD   500 mg at 07/11/19 1709  . OLANZapine zydis (ZYPREXA) disintegrating tablet 10 mg  10 mg Oral QHS Sharma Covert, MD   10 mg at 07/10/19 2118  . pneumococcal 23 valent vaccine (PNEUMOVAX-23) injection 0.5 mL  0.5 mL Intramuscular Tomorrow-1000 ,  T, MD        Lab Results: No results found for this or any previous visit (from the past 48 hour(s)).  Blood Alcohol level:  Lab Results  Component Value Date   ETH <10 32/95/1884    Metabolic Disorder Labs: Lab Results  Component Value Date   HGBA1C 8.5 (H) 07/08/2019   MPG 197.25 07/08/2019   No results found for: PROLACTIN Lab Results  Component Value Date   CHOL 256 (H) 07/09/2019   TRIG 492 (H) 07/09/2019   HDL 31 (L) 07/09/2019   CHOLHDL 8.3 07/09/2019   VLDL UNABLE TO CALCULATE IF TRIGLYCERIDE OVER 400 mg/dL 07/09/2019   LDLCALC UNABLE TO CALCULATE IF TRIGLYCERIDE OVER 400 mg/dL 07/09/2019    Physical Findings: AIMS:  , ,  ,  ,    CIWA:    COWS:     Musculoskeletal: Strength & Muscle Tone: within normal limits Gait & Station: normal Patient leans: N/A  Psychiatric Specialty Exam: Physical Exam  Nursing note and vitals reviewed. Constitutional: She appears well-developed and well-nourished.  HENT:  Head: Normocephalic and atraumatic.  Eyes: Pupils are equal, round, and reactive to light. Conjunctivae are normal.  Neck: Normal range of motion.  Cardiovascular: Regular rhythm and normal heart sounds.  Respiratory: Effort normal. No respiratory distress.  GI: Soft.  Musculoskeletal: Normal range of motion.  Neurological: She is alert.  Skin: Skin is warm and dry.  Psychiatric: She  has a normal mood and affect. Her behavior is normal. Judgment and thought content normal.    Review of Systems  Constitutional: Negative.   HENT: Negative.   Eyes: Negative.   Respiratory: Negative.   Cardiovascular: Negative.   Gastrointestinal: Negative.   Musculoskeletal: Negative.   Skin: Negative.   Neurological: Negative.   Psychiatric/Behavioral: Negative.     Blood  pressure 115/78, pulse 94, temperature 97.8 F (36.6 C), temperature source Oral, resp. rate 18, height 5\' 3"  (1.6 m), weight 64.9 kg, SpO2 100 %.Body mass index is 25.33 kg/m.  General Appearance: Casual  Eye Contact:  Good  Speech:  Clear and Coherent  Volume:  Normal  Mood:  Euthymic  Affect:  Appropriate  Thought Process:  Goal Directed  Orientation:  Full (Time, Place, and Person)  Thought Content:  Logical  Suicidal Thoughts:  No  Homicidal Thoughts:  No  Memory:  Immediate;   Fair Recent;   Fair Remote;   Fair  Judgement:  Fair  Insight:  Fair  Psychomotor Activity:  Normal  Concentration:  Concentration: Fair  Recall:  of Knowledge:  Fair  Language:  Fair  Akathisia:  Negative  Handed:  Right  AIMS (if indicated):     Assets:  Desire for Improvement  ADL's:  Intact  Cognition:  WNL  Sleep:  Number of Hours: 7     Treatment Plan Summary: Daily contact with patient to assess and evaluate symptoms and progress in treatment, Medication management and Plan Review medication with patient.  Reviewed importance of staying engaged in outpatient treatment.  Supportive counseling and review of coping skills.  We will plan on discharge tomorrow with follow-up to be arranged locally.  [employer], MD 07/11/2019, 6:06 PM

## 2019-07-11 NOTE — [facility] Group Notes (Signed)
LCSW Group Therapy Note   07/11/2019 10:59 AM   Type of Therapy and Topic:  Group Therapy:  Overcoming Obstacles   Participation Level:  Active   Description of Group:    In this group patients will be encouraged to explore what they see as obstacles to their own wellness and recovery. They will be guided to discuss their thoughts, feelings, and behaviors related to these obstacles. The group will process together ways to cope with barriers, with attention given to specific choices patients can make. Each patient will be challenged to identify changes they are motivated to make in order to overcome their obstacles. This group will be process-oriented, with patients participating in exploration of their own experiences as well as giving and receiving support and challenge from other group members.   Therapeutic Goals: 1. Patient will identify personal and current obstacles as they relate to admission. 2. Patient will identify barriers that currently interfere with their wellness or overcoming obstacles.  3. Patient will identify feelings, thought process and behaviors related to these barriers. 4. Patient will identify two changes they are willing to make to overcome these obstacles:      Summary of Patient Progress Pt was appropriate and respectful in group. Pt was able to identify physical pain, discharge, and the pandemic as her current obstacles. Pt also voiced frustration with the rules on the unit with not being able to have a real toothbrush or deodorant. Pt reported that a barrier to being discharged is not knowing what is on the checklist of things to do in order to be discharged. Pt identified that people need to wear their masks and follow protocols regarding the pandemic so things can get better. Pt expressed that people should not watch the news because it can stress you out and you should only watch it when you absolutely need to.      Therapeutic Modalities:   Cognitive  Behavioral Therapy Solution Focused Therapy Motivational Interviewing Relapse Prevention Therapy  Terri [name], MSW, LCSW Clinical Social Work 07/11/2019 10:59 AM

## 2019-07-11 NOTE — Progress Notes (Signed)
Recreation Therapy Notes    Date: 07/11/2019  Time: 9:30 am  Location: Craft room  Behavioral response: Appropriate   Intervention Topic: Goals   Discussion/Intervention:   Group content on today was focused on goals. Patients described what goals are and how they define goals. Individuals expressed how they go about setting goals and reaching them. The group identified how important goals are and if they make short term goals to reach long term goals. Patients described how many goals they work on at a time and what affects them not reaching their goal. Individuals described how much time they put into planning and obtaining their goals. The group participated in the intervention "My Goal Board" and made personal goal boards to help them achieve their goal. Clinical Observations/Feedback:  Patient came to group and stated her goal is to get use to taking medication every day so she can be healthy. Individual was social with peers and staff while participating in group. She expressed that she did not know she had goals until she completed the intervention.  Tonga Prout LRT/CTRS         Dreamer Carillo 07/11/2019 11:48 AM

## 2019-07-11 NOTE — Progress Notes (Signed)
Recreation Therapy Notes  INPATIENT RECREATION THERAPY ASSESSMENT  Patient Details Name: QUINESHA SELINGER MRN: [id number] DOB: [birth year] Today's Date: 07/11/2019       Information Obtained From: Patient  Able to Participate in Assessment/Interview: Yes  Patient Presentation: Responsive  Reason for Admission (Per Patient): Active Symptoms  Patient Stressors: Family  Coping Skills:   Talk  Leisure Interests (2+):  Individual - Reading, Exercise - Walking, Individual - TV, Sports - Dance  Frequency of Recreation/Participation:    Awareness of Community Resources:     [employer]:     Current Use:    If no, Barriers?:    Expressed Interest in [employer] Information:    [place] of Residence:  [place]  Patient Main Form of Transportation: Other (Comment)(friends)  Patient Strengths:  kind, smart  Patient Identified Areas of Improvement:  relax and let things go  Patient Goal for Hospitalization:  Get a routine  Current SI (including self-harm):  No  Current HI:  No  Current AVH: No  Staff Intervention Plan: Group Attendance, Collaborate with Interdisciplinary Treatment Team  Consent to Intern Participation: N/A  Nyeem Stoke 07/11/2019, 4:10 PM

## 2019-07-11 NOTE — Plan of Care (Signed)
Pt rates depression 2/10 and anxiety 1/10. Pt denies SI, HI and AVH. Pt was educated on care plan and verbalizes understanding. Collier Bullock RN Problem: Education: Goal: Knowledge of [place] [facility] Education information/materials will improve Outcome: Progressing Goal: Emotional status will improve Outcome: Progressing Goal: Mental status will improve Outcome: Progressing Goal: Verbalization of understanding the information provided will improve Outcome: Progressing   Problem: Safety: Goal: Periods of time without injury will increase Outcome: Progressing   Problem: Coping: Goal: Coping ability will improve Outcome: Progressing   Problem: Safety: Goal: Ability to disclose and discuss suicidal ideas will improve Outcome: Progressing   Problem: Self-Concept: Goal: Level of anxiety will decrease Outcome: Progressing

## 2019-07-11 NOTE — Plan of Care (Signed)
  Problem: Education: Goal: Knowledge of [place] [facility] Education information/materials will improve Outcome: Progressing Goal: Emotional status will improve Outcome: Progressing Goal: Mental status will improve Outcome: Progressing Goal: Verbalization of understanding the information provided will improve Outcome: Progressing  D: Patient is pleasant and cooperative. Interacting well with peers and staff. Affect is appropriate to circumstance. Denies SI, HI and AVH. Contracts for safety. Medication compliant. Voices no complaints.  A: Continue to monitor for safety R: Safety maintained.

## 2019-07-11 NOTE — Progress Notes (Signed)
D: Patient is pleasant and cooperative. Interacting well with peers and staff. Affect is appropriate to circumstance. Denies SI, HI and AVH. Contracts for safety. Medication compliant. Voices no complaints.  A: Continue to monitor for safety R: Safety maintained.

## 2019-07-12 MED ORDER — METFORMIN HCL 500 MG PO TABS: 500.0000 mg | ORAL_TABLET | Freq: Two times a day (BID) | ORAL | 0 refills | Status: DC

## 2019-07-12 MED ORDER — OLANZAPINE 10 MG PO TBDP: 10.0000 mg | ORAL_TABLET | Freq: Every day | ORAL | 0 refills | Status: DC

## 2019-07-12 NOTE — Progress Notes (Signed)
Recreation Therapy Notes  INPATIENT RECREATION TR PLAN  Patient Details Name: Terri [name] MRN: [id number] DOB: [birth year] Today's Date: 07/12/2019  Rec Therapy Plan Is patient appropriate for Therapeutic Recreation?: Yes Treatment times per week: At least 3 Estimated Length of Stay: 5-7 days TR Treatment/Interventions: Group participation (Comment)  Discharge Criteria Pt will be discharged from therapy if:: Discharged Treatment plan/goals/alternatives discussed and agreed upon by:: Patient/family  Discharge Summary Short term goals set: Patient will identify 3 positive coping skills strategies to use post d/c within 5 recreation therapy group sessions Short term goals met: Adequate for discharge Progress toward goals comments: Groups attended Which groups?: Stress management, Goal setting Reason goals not met: N/A Therapeutic equipment acquired: N/A Reason patient discharged from therapy: Discharge from hospital Pt/family agrees with progress & goals achieved: Yes Date patient discharged from therapy: 07/12/19   Umaima Scholten 07/12/2019, 12:15 PM
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
## 2019-07-12 NOTE — Progress Notes (Signed)
  [facility] Adult Case Management Discharge Plan :  Will you be returning to the same living situation after discharge:  Yes,  pt is returning to her home.  At discharge, do you have transportation home?: Yes,  mother will provide with transportation. Do you have the ability to pay for your medications: No.  Release of information consent forms completed and in the chart;  Patient's signature needed at discharge.  Patient to Follow up at: Follow-up Information    [facility] Follow up on 07/15/2019.   Why: You have an appointment scheduled for 07/15/2019 at [place]. It will be over the phone and they will call you at [phone], if you need them to contact you on a different number please call them. Thank You! Contact information: [place]  [phone] [phone]           Next level of care provider has access to [place] and Suicide Prevention discussed: No. Pt declined.   Have you used any form of tobacco in the last 30 days? (Cigarettes, Smokeless Tobacco, Cigars, and/or Pipes): Yes  Has patient been referred to the Quitline?: Patient refused referral  Patient has been referred for addiction treatment: Pt. refused referral  Rozann Lesches, LCSW 07/12/2019, 11:00 AM

## 2019-07-12 NOTE — Plan of Care (Signed)
Patient denies SI/HI/AVH, anxiety and depression with this Probation officer. Patient stated she is ready to leave tomorrow per MD orders.  Problem: Education: Goal: Emotional status will improve Outcome: Progressing Goal: Mental status will improve Outcome: Progressing

## 2019-07-12 NOTE — BHH Suicide Risk Assessment (Signed)
[facility] Discharge Suicide Risk Assessment   Principal Problem: Mixed bipolar I disorder [facility]) Discharge Diagnoses: Principal Problem:   Mixed bipolar I disorder ([place]) Active Problems:   Cannabis abuse   Total Time spent with patient: 30 minutes  Musculoskeletal: Strength & Muscle Tone: within normal limits Gait & Station: normal Patient leans: N/A  Psychiatric Specialty Exam: Review of Systems  Constitutional: Negative.   HENT: Negative.   Eyes: Negative.   Respiratory: Negative.   Cardiovascular: Negative.   Gastrointestinal: Negative.   Musculoskeletal: Negative.   Skin: Negative.   Neurological: Negative.   Psychiatric/Behavioral: Negative.     Blood pressure 132/77, pulse 88, temperature 98.3 F (36.8 C), temperature source Oral, resp. rate 18, height 5\' 3"  (1.6 m), weight 64.9 kg, SpO2 100 %.Body mass index is 25.33 kg/m.  General Appearance: Casual  Eye Contact::  Good  Speech:  Clear and [id number]  Volume:  Normal  Mood:  Euthymic  Affect:  Congruent  Thought Process:  Goal Directed  Orientation:  Full (Time, Place, and Person)  Thought Content:  Logical  Suicidal Thoughts:  No  Homicidal Thoughts:  No  Memory:  Immediate;   Fair Recent;   Fair Remote;   Fair  Judgement:  Fair  Insight:  Fair  Psychomotor Activity:  Normal  Concentration:  Fair  Recall:  [employer] [place]  Language: Fair  Akathisia:  No  Handed:  Right  AIMS (if indicated):     Assets:  Desire for Improvement Housing Physical Health Resilience  Sleep:  Number of Hours: 6.5  Cognition: WNL  ADL's:  Intact   Mental Status Per Nursing Assessment::   On Admission:  NA  Demographic Factors:  NA  Loss Factors: NA  Historical Factors: Impulsivity  Risk Reduction Factors:   Sense of responsibility to family, Living with another person, especially a relative, Positive social support and Positive therapeutic relationship  Continued Clinical Symptoms:  Bipolar  Disorder:   Mixed State  Cognitive Features That Contribute To Risk:  None    Suicide Risk:  Minimal: No identifiable suicidal ideation.  Patients presenting with no risk factors but with morbid ruminations; may be classified as minimal risk based on the severity of the depressive symptoms  Follow-up Information    [facility] Follow up on 07/15/2019.   Why: You have an appointment scheduled for 07/15/2019 at [place]. It will be over the phone and they will call you at [phone], if you need them to contact you on a different number please call them. Thank You! Contact information: [address] [place] [address] [phone]           Plan Of Care/Follow-up recommendations:  Activity:  Activity as tolerated Diet:  Regular diet Other:  Follow-up with outpatient treatment in [place] with [facility].  Continue medication.  Alethia Berthold, MD 07/12/2019, 11:14 AM

## 2019-07-12 NOTE — Plan of Care (Signed)
  Problem: Education: Goal: Knowledge of [place] [facility] Education information/materials will improve Outcome: Adequate for Discharge Goal: Emotional status will improve Outcome: Adequate for Discharge Goal: Mental status will improve Outcome: Adequate for Discharge Goal: Verbalization of understanding the information provided will improve Outcome: Adequate for Discharge   Problem: Safety: Goal: Periods of time without injury will increase Outcome: Adequate for Discharge   Problem: Coping: Goal: Coping ability will improve Outcome: Adequate for Discharge   Problem: Self-Concept: Goal: Level of anxiety will decrease Outcome: Adequate for Discharge

## 2019-07-12 NOTE — Progress Notes (Signed)
  D: Patient is aware of  Discharge this shift   A:Patient denies suicidal /homicidal ideations. Patient received all belongings brought in   No Storage medications. Writer reviewed Discharge Summary, Suicide Risk Assessment, and Transitional Record. Patient also received Prescriptions   from  MD sent to her pharmancy. A 7 day supply of medications given to patient   R: Patient left unit with no questions  Or concerns  With family

## 2019-07-12 NOTE — Plan of Care (Signed)
  Problem: Coping Skills Goal: STG - Patient will identify 3 positive coping skills strategies to use post d/c within 5 recreation therapy group sessions Description: STG - Patient will identify 3 positive coping skills strategies to use post d/c within 5 recreation therapy group sessions 07/12/2019 1213 by Ernest Haber, LRT Outcome: Adequate for Discharge 07/12/2019 1213 by Ernest Haber, LRT Outcome: Adequate for Discharge

## 2019-07-12 NOTE — Progress Notes (Signed)
D - Patient was in the day room upon arrival to the unit. Patient pleasant during assessment denying SI/HI/AVH, anxiety and depression. Patient endorses pain rating it 6/10 in her back (see MAR). Patient stated she had a good day and was observed interacting appropriately with staff and peers on the unit by this Probation officer.   A - Patient compliant with medication administration per MD orders and procedures on the unit. Patient given education. Patient given support and encouragement to be active in her treatment plan. Patient informed to let staff know if there are any issues or problems on the unit.   R - Patient being monitored Q 15 minutes for safety per unit protocol. Patient remains safe on the unit.

## 2019-07-12 NOTE — BHH Group Notes (Signed)
LCSW Group Therapy Note  07/12/2019 1:00 PM  Type of Therapy/Topic:  Group Therapy:  Feelings about Diagnosis  Participation Level:  Did Not Attend   Description of Group:   This group will allow patients to explore their thoughts and feelings about diagnoses they have received. Patients will be guided to explore their level of understanding and acceptance of these diagnoses. Facilitator will encourage patients to process their thoughts and feelings about the reactions of others to their diagnosis and will guide patients in identifying ways to discuss their diagnosis with significant others in their lives. This group will be process-oriented, with patients participating in exploration of their own experiences, giving and receiving support, and processing challenge from other group members.   Therapeutic Goals: 1. Patient will demonstrate understanding of diagnosis as evidenced by identifying two or more symptoms of the disorder 2. Patient will be able to express two feelings regarding the diagnosis 3. Patient will demonstrate their ability to communicate their needs through discussion and/or role play  Summary of Patient Progress: X  Therapeutic Modalities:   Cognitive Behavioral Therapy Brief Therapy Feelings Identification   Stephanne Greeley, MSW, LCSW 07/12/2019 11:48 AM 

## 2019-07-12 NOTE — Progress Notes (Signed)
Recreation Therapy Notes    Date: 07/12/2019  Time: 9:30 am  Location: Craft room  Behavioral response: Appropriate   Intervention Topic: Stress Management   Discussion/Intervention:   Group content on today was focused on stress. The group defined stress and way to cope with stress. Participants expressed how they know when they are stresses out. Individuals described the different ways they have to cope with stress. The group stated reasons why it is important to cope with stress. Patient explained what good stress is and some examples. The group participated in the intervention "Stress Management Jeopardy". Individuals were separated into two group and answered questions related to stress.  Clinical Observations/Feedback:  Patient came to group and defined stress as something to live with and negativity. She explained that she thinks about where she wants to be to when she deals with stress. Individual was social with peers and staff while participating in the intervention.   Caddie Randle LRT/CTRS         Enrica Corliss 07/12/2019 11:22 AM

## 2024-03-28 ENCOUNTER — Emergency Department (HOSPITAL_COMMUNITY): Payer: Self-pay

## 2024-03-28 ENCOUNTER — Encounter (HOSPITAL_COMMUNITY): Payer: Self-pay

## 2024-03-28 ENCOUNTER — Inpatient Hospital Stay (HOSPITAL_COMMUNITY): Payer: Self-pay

## 2024-03-28 ENCOUNTER — Inpatient Hospital Stay (HOSPITAL_COMMUNITY)
Admission: EM | Admit: 2024-03-28 | Discharge: 2024-03-31 | DRG: 064 | Disposition: A | Discharge status: Rehabilitation Facility | Payer: MEDICAID | Attending: Student | Admitting: Student

## 2024-03-28 ENCOUNTER — Other Ambulatory Visit: Payer: Self-pay

## 2024-03-28 DIAGNOSIS — R739 Hyperglycemia, unspecified: Secondary | ICD-10-CM | POA: Diagnosis not present

## 2024-03-28 DIAGNOSIS — I10 Essential (primary) hypertension: Secondary | ICD-10-CM | POA: Diagnosis present

## 2024-03-28 DIAGNOSIS — I618 Other nontraumatic intracerebral hemorrhage: Secondary | ICD-10-CM

## 2024-03-28 DIAGNOSIS — K219 Gastro-esophageal reflux disease without esophagitis: Secondary | ICD-10-CM | POA: Diagnosis present

## 2024-03-28 DIAGNOSIS — I739 Peripheral vascular disease, unspecified: Secondary | ICD-10-CM | POA: Diagnosis not present

## 2024-03-28 DIAGNOSIS — Z79899 Other long term (current) drug therapy: Secondary | ICD-10-CM

## 2024-03-28 DIAGNOSIS — I749 Embolism and thrombosis of unspecified artery: Secondary | ICD-10-CM | POA: Insufficient documentation

## 2024-03-28 DIAGNOSIS — E1151 Type 2 diabetes mellitus with diabetic peripheral angiopathy without gangrene: Secondary | ICD-10-CM | POA: Diagnosis present

## 2024-03-28 DIAGNOSIS — F17200 Nicotine dependence, unspecified, uncomplicated: Secondary | ICD-10-CM | POA: Insufficient documentation

## 2024-03-28 DIAGNOSIS — R29708 NIHSS score 8: Secondary | ICD-10-CM | POA: Diagnosis not present

## 2024-03-28 DIAGNOSIS — Z841 Family history of disorders of kidney and ureter: Secondary | ICD-10-CM

## 2024-03-28 DIAGNOSIS — F3160 Bipolar disorder, current episode mixed, unspecified: Secondary | ICD-10-CM | POA: Diagnosis not present

## 2024-03-28 DIAGNOSIS — G4733 Obstructive sleep apnea (adult) (pediatric): Secondary | ICD-10-CM | POA: Diagnosis present

## 2024-03-28 DIAGNOSIS — Z8249 Family history of ischemic heart disease and other diseases of the circulatory system: Secondary | ICD-10-CM | POA: Diagnosis not present

## 2024-03-28 DIAGNOSIS — I63231 Cerebral infarction due to unspecified occlusion or stenosis of right carotid arteries: Secondary | ICD-10-CM | POA: Diagnosis present

## 2024-03-28 DIAGNOSIS — R29703 NIHSS score 3: Secondary | ICD-10-CM | POA: Diagnosis present

## 2024-03-28 DIAGNOSIS — F32A Depression, unspecified: Secondary | ICD-10-CM | POA: Diagnosis not present

## 2024-03-28 DIAGNOSIS — I621 Nontraumatic extradural hemorrhage: Secondary | ICD-10-CM | POA: Diagnosis not present

## 2024-03-28 DIAGNOSIS — D72829 Elevated white blood cell count, unspecified: Secondary | ICD-10-CM | POA: Diagnosis not present

## 2024-03-28 DIAGNOSIS — E785 Hyperlipidemia, unspecified: Secondary | ICD-10-CM | POA: Diagnosis present

## 2024-03-28 DIAGNOSIS — E8729 Other acidosis: Secondary | ICD-10-CM

## 2024-03-28 DIAGNOSIS — Z7984 Long term (current) use of oral hypoglycemic drugs: Secondary | ICD-10-CM | POA: Diagnosis not present

## 2024-03-28 DIAGNOSIS — E876 Hypokalemia: Secondary | ICD-10-CM | POA: Diagnosis present

## 2024-03-28 DIAGNOSIS — I63411 Cerebral infarction due to embolism of right middle cerebral artery: Secondary | ICD-10-CM | POA: Diagnosis present

## 2024-03-28 DIAGNOSIS — R519 Headache, unspecified: Secondary | ICD-10-CM | POA: Diagnosis not present

## 2024-03-28 DIAGNOSIS — R569 Unspecified convulsions: Secondary | ICD-10-CM | POA: Diagnosis present

## 2024-03-28 DIAGNOSIS — Z86718 Personal history of other venous thrombosis and embolism: Secondary | ICD-10-CM

## 2024-03-28 DIAGNOSIS — I63511 Cerebral infarction due to unspecified occlusion or stenosis of right middle cerebral artery: Secondary | ICD-10-CM | POA: Insufficient documentation

## 2024-03-28 DIAGNOSIS — F319 Bipolar disorder, unspecified: Secondary | ICD-10-CM | POA: Diagnosis present

## 2024-03-28 DIAGNOSIS — I63033 Cerebral infarction due to thrombosis of bilateral carotid arteries: Secondary | ICD-10-CM

## 2024-03-28 DIAGNOSIS — Z716 Tobacco abuse counseling: Secondary | ICD-10-CM

## 2024-03-28 DIAGNOSIS — F251 Schizoaffective disorder, depressive type: Secondary | ICD-10-CM | POA: Diagnosis not present

## 2024-03-28 DIAGNOSIS — F411 Generalized anxiety disorder: Secondary | ICD-10-CM | POA: Diagnosis not present

## 2024-03-28 DIAGNOSIS — Z833 Family history of diabetes mellitus: Secondary | ICD-10-CM

## 2024-03-28 DIAGNOSIS — G460 Middle cerebral artery syndrome: Secondary | ICD-10-CM | POA: Diagnosis present

## 2024-03-28 DIAGNOSIS — E872 Acidosis, unspecified: Secondary | ICD-10-CM | POA: Diagnosis present

## 2024-03-28 DIAGNOSIS — Z91013 Allergy to seafood: Secondary | ICD-10-CM

## 2024-03-28 DIAGNOSIS — I6389 Other cerebral infarction: Secondary | ICD-10-CM

## 2024-03-28 DIAGNOSIS — E1165 Type 2 diabetes mellitus with hyperglycemia: Secondary | ICD-10-CM | POA: Diagnosis present

## 2024-03-28 DIAGNOSIS — Z9102 Food additives allergy status: Secondary | ICD-10-CM

## 2024-03-28 DIAGNOSIS — Z794 Long term (current) use of insulin: Secondary | ICD-10-CM | POA: Diagnosis not present

## 2024-03-28 DIAGNOSIS — Z9049 Acquired absence of other specified parts of digestive tract: Secondary | ICD-10-CM

## 2024-03-28 DIAGNOSIS — F259 Schizoaffective disorder, unspecified: Secondary | ICD-10-CM | POA: Diagnosis present

## 2024-03-28 DIAGNOSIS — G8104 Flaccid hemiplegia affecting left nondominant side: Secondary | ICD-10-CM | POA: Diagnosis present

## 2024-03-28 DIAGNOSIS — F17210 Nicotine dependence, cigarettes, uncomplicated: Secondary | ICD-10-CM | POA: Diagnosis present

## 2024-03-28 DIAGNOSIS — Z83438 Family history of other disorder of lipoprotein metabolism and other lipidemia: Secondary | ICD-10-CM

## 2024-03-28 DIAGNOSIS — Z91048 Other nonmedicinal substance allergy status: Secondary | ICD-10-CM

## 2024-03-28 DIAGNOSIS — Z7901 Long term (current) use of anticoagulants: Secondary | ICD-10-CM | POA: Diagnosis not present

## 2024-03-28 DIAGNOSIS — I6523 Occlusion and stenosis of bilateral carotid arteries: Secondary | ICD-10-CM | POA: Diagnosis present

## 2024-03-28 DIAGNOSIS — E119 Type 2 diabetes mellitus without complications: Secondary | ICD-10-CM | POA: Diagnosis not present

## 2024-03-28 DIAGNOSIS — Q2112 Patent foramen ovale: Secondary | ICD-10-CM | POA: Diagnosis not present

## 2024-03-28 DIAGNOSIS — I639 Cerebral infarction, unspecified: Secondary | ICD-10-CM | POA: Diagnosis not present

## 2024-03-28 HISTORY — DX: Allergy, unspecified, initial encounter: T78.40XA

## 2024-03-28 LAB — I-STAT CHEM 8, ED
BUN: 7 mg/dL (ref 6–20)
Calcium, Ion: 1.16 mmol/L (ref 1.15–1.40)
Chloride: 98 mmol/L (ref 98–111)
Creatinine, Ser: 0.8 mg/dL (ref 0.44–1.00)
Glucose, Bld: 500 mg/dL — ABNORMAL HIGH (ref 70–99)
HCT: 47 % — ABNORMAL HIGH (ref 36.0–46.0)
Hemoglobin: 16 g/dL — ABNORMAL HIGH (ref 12.0–15.0)
Potassium: 3.7 mmol/L (ref 3.5–5.1)
Sodium: 138 mmol/L (ref 135–145)
TCO2: 27 mmol/L (ref 22–32)

## 2024-03-28 LAB — URINALYSIS, ROUTINE W REFLEX MICROSCOPIC
Bacteria, UA: NONE SEEN
Bilirubin Urine: NEGATIVE
Glucose, UA: >=500 mg/dL — AB
Ketones, ur: NEGATIVE mg/dL
Leukocytes,Ua: NEGATIVE
Nitrite: NEGATIVE
Protein, ur: 30 mg/dL — AB
Specific Gravity, Urine: 1.031 — ABNORMAL HIGH (ref 1.005–1.030)
pH: 7 (ref 5.0–8.0)

## 2024-03-28 LAB — DIFFERENTIAL
Abs Immature Granulocytes: 0.16 K/uL — ABNORMAL HIGH (ref 0.00–0.07)
Basophils Absolute: 0 K/uL (ref 0.0–0.1)
Basophils Relative: 0 %
Eosinophils Absolute: 0.1 K/uL (ref 0.0–0.5)
Eosinophils Relative: 1 %
Immature Granulocytes: 1 %
Lymphocytes Relative: 23 %
Lymphs Abs: 3.2 K/uL (ref 0.7–4.0)
Monocytes Absolute: 0.9 K/uL (ref 0.1–1.0)
Monocytes Relative: 6 %
Neutro Abs: 9.6 K/uL — ABNORMAL HIGH (ref 1.7–7.7)
Neutrophils Relative %: 69 %

## 2024-03-28 LAB — GLUCOSE, CAPILLARY
Glucose-Capillary: 132 mg/dL — ABNORMAL HIGH (ref 70–99)
Glucose-Capillary: 136 mg/dL — ABNORMAL HIGH (ref 70–99)
Glucose-Capillary: 145 mg/dL — ABNORMAL HIGH (ref 70–99)
Glucose-Capillary: 162 mg/dL — ABNORMAL HIGH (ref 70–99)
Glucose-Capillary: 166 mg/dL — ABNORMAL HIGH (ref 70–99)
Glucose-Capillary: 169 mg/dL — ABNORMAL HIGH (ref 70–99)
Glucose-Capillary: 174 mg/dL — ABNORMAL HIGH (ref 70–99)
Glucose-Capillary: 214 mg/dL — ABNORMAL HIGH (ref 70–99)
Glucose-Capillary: 236 mg/dL — ABNORMAL HIGH (ref 70–99)
Glucose-Capillary: 279 mg/dL — ABNORMAL HIGH (ref 70–99)
Glucose-Capillary: 344 mg/dL — ABNORMAL HIGH (ref 70–99)

## 2024-03-28 LAB — CBC
HCT: 40.9 % (ref 36.0–46.0)
Hemoglobin: 13.7 g/dL (ref 12.0–15.0)
MCH: 29.5 pg (ref 26.0–34.0)
MCHC: 33.5 g/dL (ref 30.0–36.0)
MCV: 88.1 fL (ref 80.0–100.0)
Platelets: 341 K/uL (ref 150–400)
RBC: 4.64 MIL/uL (ref 3.87–5.11)
RDW: 12.7 % (ref 11.5–15.5)
WBC: 14 K/uL — ABNORMAL HIGH (ref 4.0–10.5)
nRBC: 0.1 % (ref 0.0–0.2)

## 2024-03-28 LAB — COMPREHENSIVE METABOLIC PANEL WITH GFR
ALT: 10 U/L (ref 0–44)
AST: 22 U/L (ref 15–41)
Albumin: 3.2 g/dL — ABNORMAL LOW (ref 3.5–5.0)
Alkaline Phosphatase: 93 U/L (ref 38–126)
Anion gap: 13 (ref 5–15)
BUN: 6 mg/dL (ref 6–20)
CO2: 24 mmol/L (ref 22–32)
Calcium: 9.3 mg/dL (ref 8.9–10.3)
Chloride: 99 mmol/L (ref 98–111)
Creatinine, Ser: 0.95 mg/dL (ref 0.44–1.00)
GFR, Estimated: >60 mL/min (ref >60)
Glucose, Bld: 504 mg/dL (ref 70–99)
Potassium: 3.5 mmol/L (ref 3.5–5.1)
Sodium: 136 mmol/L (ref 135–145)
Total Bilirubin: 0.7 mg/dL (ref 0.0–1.2)
Total Protein: 6.8 g/dL (ref 6.5–8.1)

## 2024-03-28 LAB — HCG, SERUM, QUALITATIVE: Preg, Serum: NEGATIVE

## 2024-03-28 LAB — RAPID URINE DRUG SCREEN, HOSP PERFORMED
Amphetamines: NOT DETECTED
Barbiturates: NOT DETECTED
Benzodiazepines: NOT DETECTED
Cocaine: NOT DETECTED
Opiates: NOT DETECTED
Tetrahydrocannabinol: POSITIVE — AB

## 2024-03-28 LAB — MRSA NEXT GEN BY PCR, NASAL: MRSA by PCR Next Gen: NOT DETECTED

## 2024-03-28 LAB — ECHOCARDIOGRAM COMPLETE
AR max vel: 1.81 cm2
AV Area VTI: 1.84 cm2
AV Area mean vel: 1.88 cm2
AV Mean grad: 3 mmHg
AV Peak grad: 6.1 mmHg
Ao pk vel: 1.23 m/s
Height: 62 in
S' Lateral: 3.1 cm
Weight: 2192 [oz_av]

## 2024-03-28 LAB — PROTIME-INR
INR: 0.9 (ref 0.8–1.2)
Prothrombin Time: 13 s (ref 11.4–15.2)

## 2024-03-28 LAB — CBG MONITORING, ED: Glucose-Capillary: 515 mg/dL (ref 70–99)

## 2024-03-28 LAB — I-STAT VENOUS BLOOD GAS, ED
Acid-Base Excess: 3 mmol/L — ABNORMAL HIGH (ref 0.0–2.0)
Bicarbonate: 27.2 mmol/L (ref 20.0–28.0)
Calcium, Ion: 1.11 mmol/L — ABNORMAL LOW (ref 1.15–1.40)
HCT: 45 % (ref 36.0–46.0)
Hemoglobin: 15.3 g/dL — ABNORMAL HIGH (ref 12.0–15.0)
O2 Saturation: 72 %
Potassium: 3.7 mmol/L (ref 3.5–5.1)
Sodium: 137 mmol/L (ref 135–145)
TCO2: 28 mmol/L (ref 22–32)
pCO2, Ven: 39.9 mmHg — ABNORMAL LOW (ref 44–60)
pH, Ven: 7.441 — ABNORMAL HIGH (ref 7.25–7.43)
pO2, Ven: 36 mmHg (ref 32–45)

## 2024-03-28 LAB — ETHANOL: Alcohol, Ethyl (B): <15 mg/dL (ref <15)

## 2024-03-28 LAB — APTT: aPTT: 29 s (ref 24–36)

## 2024-03-28 LAB — I-STAT CG4 LACTIC ACID, ED: Lactic Acid, Venous: 5.1 mmol/L (ref 0.5–1.9)

## 2024-03-28 LAB — HEPARIN LEVEL (UNFRACTIONATED)
Heparin Unfractionated: <0.1 [IU]/mL — ABNORMAL LOW (ref 0.30–0.70)
Heparin Unfractionated: <0.1 [IU]/mL — ABNORMAL LOW (ref 0.30–0.70)

## 2024-03-28 LAB — HIV ANTIBODY (ROUTINE TESTING W REFLEX): HIV Screen 4th Generation wRfx: NONREACTIVE

## 2024-03-28 LAB — BETA-HYDROXYBUTYRIC ACID: Beta-Hydroxybutyric Acid: 0.21 mmol/L (ref 0.05–0.27)

## 2024-03-28 MED ORDER — LEVETIRACETAM (KEPPRA) 500 MG/5 ML ADULT IV PUSH
1000.0000 mg | Freq: Two times a day (BID) | INTRAVENOUS | Status: DC
  Administered 2024-03-28 – 2024-03-29 (×2): 1000 mg via INTRAVENOUS
  Filled 2024-03-28 (×2): qty 10

## 2024-03-28 MED ORDER — LORAZEPAM 2 MG/ML IJ SOLN
1.0000 mg | Freq: Once | INTRAMUSCULAR | Status: AC
  Administered 2024-03-28: 1 mg via INTRAVENOUS

## 2024-03-28 MED ORDER — LACTATED RINGERS IV BOLUS
1000.0000 mL | Freq: Once | INTRAVENOUS | Status: AC
  Administered 2024-03-28: 1000 mL via INTRAVENOUS

## 2024-03-28 MED ORDER — HEPARIN (PORCINE) 25000 UT/250ML-% IV SOLN
1150.0000 [IU]/h | INTRAVENOUS | Status: AC
  Administered 2024-03-28: 750 [IU]/h via INTRAVENOUS
  Administered 2024-03-29: 1050 [IU]/h via INTRAVENOUS
  Administered 2024-03-30: 1150 [IU]/h via INTRAVENOUS
  Filled 2024-03-28 (×3): qty 250

## 2024-03-28 MED ORDER — LORAZEPAM 2 MG/ML IJ SOLN
INTRAMUSCULAR | Status: AC
  Filled 2024-03-28: qty 1

## 2024-03-28 MED ORDER — INSULIN ASPART 100 UNIT/ML IJ SOLN
1.0000 [IU] | INTRAMUSCULAR | Status: DC
  Administered 2024-03-29: 1 [IU] via SUBCUTANEOUS

## 2024-03-28 MED ORDER — GADOBUTROL 1 MMOL/ML IV SOLN
7.0000 mL | Freq: Once | INTRAVENOUS | Status: AC | PRN
  Administered 2024-03-28: 7 mL via INTRAVENOUS

## 2024-03-28 MED ORDER — ONDANSETRON HCL 4 MG/2ML IJ SOLN
4.0000 mg | Freq: Four times a day (QID) | INTRAMUSCULAR | Status: DC | PRN
  Administered 2024-03-29 (×2): 4 mg via INTRAVENOUS
  Filled 2024-03-28 (×2): qty 2

## 2024-03-28 MED ORDER — CHLORHEXIDINE GLUCONATE CLOTH 2 % EX PADS
6.0000 | MEDICATED_PAD | Freq: Every day | CUTANEOUS | Status: DC
  Administered 2024-03-28 – 2024-03-29 (×2): 6 via TOPICAL

## 2024-03-28 MED ORDER — IOHEXOL 350 MG/ML SOLN
100.0000 mL | Freq: Once | INTRAVENOUS | Status: AC | PRN
  Administered 2024-03-28: 100 mL via INTRAVENOUS

## 2024-03-28 MED ORDER — POLYETHYLENE GLYCOL 3350 17 G PO PACK: 17.0000 g | PACK | Freq: Every day | ORAL | Status: DC | PRN

## 2024-03-28 MED ORDER — DEXTROSE 50 % IV SOLN: 0.0000 mL | INTRAVENOUS | Status: DC | PRN

## 2024-03-28 MED ORDER — INSULIN REGULAR(HUMAN) IN NACL 100-0.9 UT/100ML-% IV SOLN
INTRAVENOUS | Status: DC
  Administered 2024-03-28: 12 [IU]/h via INTRAVENOUS
  Filled 2024-03-28: qty 100

## 2024-03-28 MED ORDER — LEVETIRACETAM (KEPPRA) 500 MG/5 ML ADULT IV PUSH
2000.0000 mg | Freq: Once | INTRAVENOUS | Status: AC
  Administered 2024-03-28: 2000 mg via INTRAVENOUS
  Filled 2024-03-28: qty 20

## 2024-03-28 MED ORDER — INSULIN GLARGINE-YFGN 100 UNIT/ML ~~LOC~~ SOLN
8.0000 [IU] | Freq: Two times a day (BID) | SUBCUTANEOUS | Status: DC
  Administered 2024-03-28 – 2024-03-30 (×4): 8 [IU] via SUBCUTANEOUS
  Filled 2024-03-28 (×5): qty 0.08

## 2024-03-28 NOTE — ED Notes (Signed)
 Patient is c/o of having to urinate patient has purewick however still isn't able to void, In and out cath done with order of Dr. Hillman. Patient cath 1100 out.

## 2024-03-28 NOTE — Progress Notes (Signed)
 PHARMACY - ANTICOAGULATION CONSULT NOTE  Pharmacy Consult for heparin  Indication: stroke  Allergies  Allergen Reactions   Dye Fdc Blue [Brilliant Blue Fcf (Fd&C Blue #1)] Hives    MRI   Other Nausea And Vomiting    Zole    Patient Measurements: Height: 5' 2 (157.5 cm) Weight: 62.1 kg (137 lb) IBW/kg (Calculated) : 50.1 HEPARIN  DW (KG): 62.1  Vital Signs: Temp: 98.1 F (36.7 C) (08/04 1200) Temp Source: Oral (08/04 1200) BP: 139/82 (08/04 1400) Pulse Rate: 117 (08/04 1400)  Labs: Recent Labs    03/28/24 0524 03/28/24 0538 03/28/24 0538 03/28/24 0546 03/28/24 0548 03/28/24 1433  HGB  --  13.7   < > 16.0* 15.3*  --   HCT  --  40.9  --  47.0* 45.0  --   PLT  --  341  --   --   --   --   APTT  --  29  --   --   --   --   LABPROT  --  13.0  --   --   --   --   INR  --  0.9  --   --   --   --   HEPARINUNFRC  --   --   --   --   --  <0.10*  CREATININE 0.95  --   --  0.80  --   --    < > = values in this interval not displayed.    Estimated Creatinine Clearance: 77 mL/min (by C-G formula based on SCr of 0.8 mg/dL).   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  Heparin  level < 0.1 on heparin  750 units/hr. No issues with the infusion or overt signs of bleeding reported per RN.  Goal of Therapy:  Heparin  level 0.3-0.5 units/ml Monitor platelets by anticoagulation protocol: Yes   Plan:  Increase heparin  infusion to 850 units/hr. Recheck heparin   level in 6hrs Monitor heparin  levels and CBC.  Rocky Slade, PharmD, BCPS 03/28/2024,3:30 PM  Please check AMION for all [facility] Pharmacy phone numbers After 10:00 PM, call Main Pharmacy [phone]

## 2024-03-28 NOTE — Progress Notes (Signed)
 Patient from home with c/o dizziness and weakness. Patient had witnessed seizure like activity in hospital. No insurance or PCP listed.

## 2024-03-28 NOTE — Progress Notes (Signed)
 EEG complete - results pending

## 2024-03-28 NOTE — ED Triage Notes (Signed)
 Pt states she has been feeling dizzy, left arm feels like it is going backwards and both legs are weak to the point she is not able to walk well. Pt states her symptoms started approximately a week ago. Pt states she fainted last night and started feeling worse. Pt states her knee, neck and right arm are hurting from falling. Pt states she is also having abdominal pain and nausea. Pt thinks she vomited when she fainted.

## 2024-03-28 NOTE — ED Notes (Addendum)
 Report called to [facility] will take patient up  as soon as EEG complete

## 2024-03-28 NOTE — ED Notes (Signed)
 Pt had seizure in CT ativan  administered.

## 2024-03-28 NOTE — Progress Notes (Signed)
 PCCM INTERVAL PROGRESS NOTE   Endotool recommending transition to sliding scale from infusion. Not DKA. Ok to transition. Give basal and stop drip after per protocol.      [name], AGACNP-BC [facility]  See Amion for personal pager PCCM on call pager [phone] until 7pm. Please call Elink 7p-7a. [phone]  03/28/2024 7:11 PM

## 2024-03-28 NOTE — ED Notes (Signed)
 EEG at bedside.

## 2024-03-28 NOTE — ED Notes (Signed)
CCM at bedside

## 2024-03-28 NOTE — Progress Notes (Addendum)
 PHARMACY - ANTICOAGULATION CONSULT NOTE  Pharmacy Consult for heparin  Indication: stroke  Allergies  Allergen Reactions   Dye Fdc Blue [Brilliant Blue Fcf (Fd&C Blue #1)] Hives    MRI   Other Nausea And Vomiting    Zole    Patient Measurements: Height: 5' 2 (157.5 cm) Weight: 62.1 kg (137 lb) IBW/kg (Calculated) : 50.1 HEPARIN  DW (KG): 62.1  Vital Signs: Temp: 98.7 F (37.1 C) (08/04 2000) Temp Source: Oral (08/04 2000) BP: 131/82 (08/04 1900) Pulse Rate: 102 (08/04 1900)  Labs: Recent Labs    03/28/24 0524 03/28/24 0538 03/28/24 0538 03/28/24 0546 03/28/24 0548 03/28/24 1433 03/28/24 2153  HGB  --  13.7   < > 16.0* 15.3*  --   --   HCT  --  40.9  --  47.0* 45.0  --   --   PLT  --  341  --   --   --   --   --   APTT  --  29  --   --   --   --   --   LABPROT  --  13.0  --   --   --   --   --   INR  --  0.9  --   --   --   --   --   HEPARINUNFRC  --   --   --   --   --  <0.10* <0.10*  CREATININE 0.95  --   --  0.80  --   --   --    < > = values in this interval not displayed.    Estimated Creatinine Clearance: 77 mL/min (by C-G formula based on SCr of 0.8 mg/dL).   Assessment: 48yo female presents to ED for dizziness and weakness starting ~1wk ago, called as code stroke; CT and CTA reveal acute thrombus in both common carotid arteries and possible subacute infarcts as well as petechial hemorrhage >> to start heparin  infusion given risk<benefit given size and location of intraluminal thrombi.  Heparin  level < 0.1 on heparin  850 units/hr. RN reports issues with heparin  infusion needing to be paused for 15-20 min in MRI due to line issues, etc. No bleeding noted. RN thinks this was about 1 hr before level drawn.   Goal of Therapy:  Heparin  level 0.3-0.5 units/ml Monitor platelets by anticoagulation protocol: Yes   Plan:  Increase heparin  infusion to 950 units/hr. Recheck heparin  level in 6hrs  Vito Ralph, PharmD, BCPS Please see amion for complete clinical  pharmacist phone list 03/28/2024,10:45 PM

## 2024-03-28 NOTE — Evaluation (Signed)
 Clinical/Bedside Swallow Evaluation Patient Details  Name: Terri [name] MRN: [id number] Date of Birth: 05-28-1976  Today's Date: 03/28/2024 Time: SLP Start Time (ACUTE ONLY): 9061 SLP Stop Time (ACUTE ONLY): 0950 SLP Time Calculation (min) (ACUTE ONLY): 12 min  Past Medical History:  Past Medical History:  Diagnosis Date   DEPRESSION 10/18/2009   ENDOMETRIOSIS 10/18/2009   GERD 10/18/2009   History of HPV infection    MIGRAINE HEADACHE 10/18/2009   UNSPECIFIED PERIPHERAL VASCULAR DISEASE 10/18/2009   Past Surgical History:  Past Surgical History:  Procedure Laterality Date   APPENDECTOMY     BOWEL RESECTION     r/t endometriosis   OVARIAN CYST REMOVAL     bilateral   HPI:  Pt is a 48 year old female who presented on 8/4 with complaints of weakness and AMS. 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 PMH: DVT, Bipolar, Schizoaffective disorder, and migraines.    Assessment / Plan / Recommendation  Clinical Impression  Pt was seen for bedside swallow evaluation and she denied a history of dysphagia. Oral mechanism exam was significant for left-sided facial and lingual weakness and reduced lingual ROM. Dentition was adequate, but with some missing teeth. She presented with symptoms of oral phase dysphagia characterized by mild oral residue which was cleared with a liquid wash. A regular texture diet with thin liquids is recommended at this time and SLP Terri follow to ensure tolerance. SLP Visit Diagnosis: Dysphagia, unspecified (R13.10)    Aspiration Risk  Mild aspiration risk    Diet Recommendation Regular;Thin liquid    Liquid Administration via: Cup;Straw Medication Administration: Whole meds with puree (or with liquids; as tolerated) Supervision: Patient able to self feed Compensations: Slow  rate;Small sips/bites;Follow solids with liquid Postural Changes: Seated upright at 90 degrees    Other  Recommendations Oral Care Recommendations: Oral care BID     Assistance Recommended at Discharge    Functional Status Assessment Patient has had a recent decline in their functional status and demonstrates the ability to make significant improvements in function in a reasonable and predictable amount of time.  Frequency and Duration min 2x/week  2 weeks       Prognosis Prognosis for improved oropharyngeal function: Good      Swallow Study   General Date of Onset: 03/28/24 HPI: Pt is a 48 year old female who presented on 8/4 with complaints of weakness and AMS. 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 PMH: DVT, Bipolar, Schizoaffective disorder, and migraines. Type of Study: Bedside Swallow Evaluation Previous Swallow Assessment: none Diet Prior to this Study: NPO Temperature Spikes Noted: No Respiratory Status: Room air History of Recent Intubation: No Behavior/Cognition: Cooperative;Pleasant mood;Lethargic/Drowsy Oral Cavity Assessment: Within Functional Limits Oral Care Completed by SLP: No Oral Cavity - Dentition: Missing dentition;Adequate natural dentition Vision: Functional for self-feeding Self-Feeding Abilities: Needs assist Patient Positioning: Upright in bed;Postural control adequate for testing Baseline Vocal Quality: Normal Volitional Cough: Strong Volitional Swallow: Able to elicit    Oral/Motor/Sensory Function Overall Oral Motor/Sensory Function: Mild impairment Facial ROM: Reduced left;Suspected CN VII (facial) dysfunction Facial Symmetry: Abnormal symmetry left;Suspected CN VII (facial) dysfunction Facial Strength: Reduced left;Suspected CN VII (facial) dysfunction Facial  Sensation: Within Functional Limits Lingual ROM: Reduced left;Suspected CN XII (hypoglossal) dysfunction Lingual Symmetry: Abnormal symmetry left;Suspected CN XII (hypoglossal) dysfunction Lingual Strength: Reduced;Suspected CN XII (hypoglossal) dysfunction Lingual Sensation: Within Functional Limits   Ice Chips Ice chips: Within functional limits Presentation: Spoon   Thin Liquid Thin Liquid: Within functional limits Presentation: Cup;Straw    Nectar Thick Nectar Thick Liquid: Not tested   Honey Thick Honey Thick Liquid: Not tested   Puree Puree: Within functional limits Presentation: Spoon   Solid     Solid: Impaired Presentation: Self Fed Oral Phase Impairments: Impaired mastication Oral Phase Functional Implications: Oral residue;Impaired mastication     Terri Brase I. [place], MS, CCC-SLP Acute Rehabilitation Services Office number [phone]  Terri [name] 03/28/2024,10:54 AM

## 2024-03-28 NOTE — Consult Note (Signed)
 NEUROLOGY CONSULT NOTE   Date of service: March 28, 2024 Patient Name: Terri [name] MRN:  [id number] DOB:  07/25/1976 Chief Complaint: Left-sided weakness Requesting Provider: Albertina Dixon, MD  History of Present Illness  Terri [name] is a 48 y.o. female with hx of diabetes who presents with left-sided weakness.  She started noticing some problems around 9 PM last night and came in complaining of some nonspecific dizziness and weakness to both legs.  In the triage room, she went to the bathroom and on standing up from the toilet she had an episode lasting for approximately 10 seconds of stiffening with left eye deviation.  She recovered fairly quickly but had much worsened left-sided weakness following this.  A code stroke was activated and she was evaluated emergently in the CAT scanner.  At that time, she had her left-sided weakness was markedly improving.  CT/CT read were obtained and following the CT, she had a very clearly convulsive seizure with extension of the left arm, left gaze deviation, unresponsiveness.  She was given 1 mg of Ativan  with rapid improvement in her mental status.  LKW: 9 PM Modified rankin score: 0-Completely asymptomatic and back to baseline post- stroke IV Thrombolysis: No, outside of window EVT: No, no LVO  NIHSS components Score: Comment  1a Level of Conscious 0[x]  1[]  2[]  3[]      1b LOC Questions 0[x]  1[]  2[]       1c LOC Commands 0[x]  1[]  2[]       2 Best Gaze 0[x]  1[]  2[]       3 Visual 0[x]  1[]  2[]  3[]      4 Facial Palsy 0[]  1[x]  2[]  3[]      5a Motor Arm - left 0[]  1[x]  2[]  3[]  4[]  UN[]    5b Motor Arm - Right 0[x]  1[]  2[]  3[]  4[]  UN[]    6a Motor Leg - Left 0[]  1[x]  2[]  3[]  4[]  UN[]    6b Motor Leg - Right 0[x]  1[]  2[]  3[]  4[]  UN[]    7 Limb Ataxia 0[]  1[]  2[x]  UN[]      8 Sensory 0[]  1[x]  2[]  UN[]      9 Best Language 0[x]  1[]  2[]  3[]      10 Dysarthria 0[]  1[x]  2[]  UN[]      11 Extinct. and Inattention 0[]  1[x]  2[]       TOTAL: 8        Past History   Past Medical History:  Diagnosis Date   DEPRESSION 10/18/2009   ENDOMETRIOSIS 10/18/2009   GERD 10/18/2009   History of HPV infection    MIGRAINE HEADACHE 10/18/2009   UNSPECIFIED PERIPHERAL VASCULAR DISEASE 10/18/2009    Past Surgical History:  Procedure Laterality Date   APPENDECTOMY     BOWEL RESECTION     r/t endometriosis   OVARIAN CYST REMOVAL     bilateral    Family History: Family History  Problem Relation Age of Onset   Hyperlipidemia Mother    Diabetes Mother    Diabetes Father    Hypertension Father    Heart disease Father 48       CABG   Kidney disease Father    Prostate cancer Father     Social History  reports that she has been smoking cigarettes. She has a 7.5 pack-year smoking history. She has never used smokeless tobacco. She reports current alcohol use. She reports current drug use. Drug: Marijuana.  Allergies  Allergen Reactions   Dye Fdc Blue [Brilliant Blue Fcf (Fd&C Blue #1)]     MRI, caused hives  Other Other (See Comments)    Zole:nausea    Medications   Current Facility-Administered Medications:    lactated ringers  bolus 1,000 mL, 1,000 mL, Intravenous, Once, Kommor, Madison, MD   levETIRAcetam  (KEPPRA ) undiluted injection 2,000 mg, 2,000 mg, Intravenous, Once, Kommor, Madison, MD  Current Outpatient Medications:    metFORMIN  (GLUCOPHAGE ) 500 MG tablet, Take 1 tablet (500 mg total) by mouth 2 (two) times daily with a meal., Disp: 30 tablet, Rfl: 0   OLANZapine  zydis (ZYPREXA ) 10 MG disintegrating tablet, Take 1 tablet (10 mg total) by mouth at bedtime., Disp: 30 tablet, Rfl: 0  Vitals   Vitals:   04/17/2024 0513 2024/04/17 0515 2024-04-17 0600  BP: (!) 144/85  (!) 140/86  Pulse: (!) 126  (!) 113  Resp: 15  (!) 28  Temp: 98.5 F (36.9 C)    SpO2: 100%  100%  Weight:  62.1 kg   Height:  5' 2 (1.575 m)     Body mass index is 25.06 kg/m.   Physical Exam   Constitutional: Appears well-developed and well-nourished.    Neurologic Examination    Neuro: Mental Status: Patient is awake, alert, oriented to person, place, month(presenting July, she is or it may be August), year, and situation. Patient is able to give a clear and coherent history. No signs of aphasia or neglect Cranial Nerves: II: Visual Fields are full. Pupils are equal, round, and reactive to light.   III,IV, VI: EOMI without ptosis or diploplia.  V: Facial sensation is symmetric to temperature VII: Facial movement with left sided weakness Motor: She has mild 4/5 weakness of the left arm and leg, with discoordination out of proportion to weakness  sensory: Sensation is reduced on the left, she extinguishes to double simultaneous stimulation  Cerebellar: Dyscoordination out of proportion to weakness  She had a convulsive seizure with left gaze deviation, left arm extension and unresponsiveness, following which she had significantly increased left-sided weakness.      Labs/Imaging/Neurodiagnostic studies   CBC:  Recent Labs  Lab 04-17-2024 0538 04-17-2024 0546 04/17/24 0548  WBC 14.0*  --   --   NEUTROABS 9.6*  --   --   HGB 13.7 16.0* 15.3*  HCT 40.9 47.0* 45.0  MCV 88.1  --   --   PLT 341  --   --    Basic Metabolic Panel:  Lab Results  Component Value Date   NA 137 2024/04/17   K 3.7 04/17/24   CO2 24 Apr 17, 2024   GLUCOSE 500 (H) April 17, 2024   BUN 7 04-17-24   CREATININE 0.80 04/17/24   CALCIUM  9.3 04/17/2024   GFRNONAA >60 2024/04/17   GFRAA >60 07/04/2019   Lipid Panel:  Lab Results  Component Value Date   LDLCALC UNABLE TO CALCULATE IF TRIGLYCERIDE OVER 400 mg/dL 88/85/7979   YhaJ8r:  Lab Results  Component Value Date   HGBA1C 8.5 (H) 07/08/2019   Urine Drug Screen:     Component Value Date/Time   LABOPIA NONE DETECTED 04/17/2024 0542   COCAINSCRNUR NONE DETECTED 04-17-2024 0542   LABBENZ NONE DETECTED 04-17-24 0542   AMPHETMU NONE DETECTED 2024/04/17 0542   THCU POSITIVE (A)  2024-04-17 0542   LABBARB NONE DETECTED 2024/04/17 0542    Alcohol Level     Component Value Date/Time   [facility] <15 17-Apr-2024 0524   INR  Lab Results  Component Value Date   INR 0.9 Apr 17, 2024   APTT  Lab Results  Component Value Date   APTT 29 17-Apr-2024  CT Head without contrast(Personally reviewed): Multiple subacute strokes,   CT angio Head and Neck with contrast(Personally reviewed): Intraluminal thrombus in bilateral ICAs   ASSESSMENT   Terri [name] is a 48 y.o. female with a history of DVT who presents with deficits referable to the right MCA, with bilateral ICA intraluminal clots of unclear origin.  She does have significant petechial hemorrhage into one of her subacute strokes, however given the size and location of her intraluminal thrombi bilaterally, I think that the risk/benefit ratio of anticoagulation would be in favor of low-dose heparin  to try and stabilize these thrombi.   RECOMMENDATIONS  Keppra  2g x 1 followed by 1g BID.  Heparin , stroke protocol Management of severe hyperglycemia per CCM, likely contributing to higher seizure threshold.  EEG Repeat stat CT for any abrupt clinical change, or in 6 hours.  CTA chest to look for signs of PE, would also include abdomen pelvis with lower extremity veins to look for other evidence of malignancy which could explain hypercoagulability or DVT Stat echo to look for vegetations to see if there is evidence of endocarditis Stroke team to follow  ______________________________________________________________________   This patient is critically ill and at significant risk of neurological worsening, death and care requires constant monitoring of vital signs, hemodynamics,respiratory and cardiac monitoring, neurological assessment, discussion with family, other specialists and medical decision making of high complexity. I spent 65 minutes of neurocritical care time  in the care of  this patient. This was time  spent independent of any time provided by nurse practitioner or PA.  Aisha Seals, MD [facility]   If 7pm- 7am, please page neurology on call as listed in AMION. 03/28/2024  7:12 AM

## 2024-03-28 NOTE — ED Notes (Signed)
 Pt had seizure 1 mg ativan  administered.

## 2024-03-28 NOTE — Progress Notes (Signed)
 PHARMACY - ANTICOAGULATION CONSULT NOTE  Pharmacy Consult for heparin  Indication: stroke  Allergies  Allergen Reactions   Dye Fdc Blue [Brilliant Blue Fcf (Fd&C Blue #1)]     MRI, caused hives   Other Other (See Comments)    Zole:nausea    Patient Measurements: Height: 5' 2 (157.5 cm) Weight: 62.1 kg (137 lb) IBW/kg (Calculated) : 50.1 HEPARIN  DW (KG): 62.1  Vital Signs: Temp: 98.5 F (36.9 C) (08/04 0513) BP: 141/119 (08/04 0615) Pulse Rate: 120 (08/04 0615)  Labs: Recent Labs    03/28/24 0524 03/28/24 0538 03/28/24 0538 03/28/24 0546 03/28/24 0548  HGB  --  13.7   < > 16.0* 15.3*  HCT  --  40.9  --  47.0* 45.0  PLT  --  341  --   --   --   APTT  --  29  --   --   --   LABPROT  --  13.0  --   --   --   INR  --  0.9  --   --   --   CREATININE 0.95  --   --  0.80  --    < > = values in this interval not displayed.    Estimated Creatinine Clearance: 77 mL/min (by C-G formula based on SCr of 0.8 mg/dL).   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  Goal of Therapy:  Heparin  level 0.3-0.5 units/ml Monitor platelets by anticoagulation protocol: Yes   Plan:  Start heparin  infusion cautiously at 750 units/hr. Monitor heparin  levels and CBC.  Marvetta Dauphin, PharmD, BCPS  03/28/2024,7:03 AM

## 2024-03-28 NOTE — ED Provider Notes (Signed)
 [facility] Provider Note  CSN: [id number] Arrival date & time: 03/28/24 9491  Chief Complaint(s) Weakness  HPI Terri [name] is a 48 y.o. female with PMH schizoaffective disorder, bipolar 1 disorder, endometriosis, previous DVT who presents emergency room for evaluation of generalized fatigue, weakness and altered mental status.  States that at approximately 9 PM last night she had dinner and immediately following dinner she noticed that her arms were jumping.  She states that she has been having headaches over the last 2 weeks and is generally feeling poorly.  Patient reportedly had an episode in triage of full body tension where she was not responsive to commands but now is returned to normal mental status baseline.  On arrival, patient is alert answering questions but is not moving her left upper or lower extremities.   Past Medical History Past Medical History:  Diagnosis Date   DEPRESSION 10/18/2009   ENDOMETRIOSIS 10/18/2009   GERD 10/18/2009   History of HPV infection    MIGRAINE HEADACHE 10/18/2009   UNSPECIFIED PERIPHERAL VASCULAR DISEASE 10/18/2009   Patient Active Problem List   Diagnosis Date Noted   Schizoaffective disorder (HCC) 07/08/2019   Mixed bipolar I disorder (HCC) 07/08/2019   Cannabis abuse 07/08/2019   Vaginitis 12/02/2011   Obesity 12/02/2011   Dysmenorrhea 12/02/2011   DEPRESSION 10/18/2009   MIGRAINE HEADACHE 10/18/2009   UNSPECIFIED PERIPHERAL VASCULAR DISEASE 10/18/2009   GERD 10/18/2009   ENDOMETRIOSIS 10/18/2009   Home Medication(s) Prior to Admission medications   Medication Sig Start Date End Date Taking? Authorizing Provider  metFORMIN  (GLUCOPHAGE ) 500 MG tablet Take 1 tablet (500 mg total) by mouth 2 (two) times daily with a meal. 07/12/19   Starkes-Perry, Majel RAMAN, FNP  OLANZapine  zydis (ZYPREXA ) 10 MG disintegrating tablet Take 1 tablet (10 mg total) by mouth at bedtime. 07/12/19    Wilkie Majel RAMAN, FNP                                                                                                                                    Past Surgical History Past Surgical History:  Procedure Laterality Date   APPENDECTOMY     BOWEL RESECTION     r/t endometriosis   OVARIAN CYST REMOVAL     bilateral   Family History Family History  Problem Relation Age of Onset   Hyperlipidemia Mother    Diabetes Mother    Diabetes Father    Hypertension Father    Heart disease Father 92       CABG   Kidney disease Father    Prostate cancer Father     Social History Social History   Tobacco Use   Smoking status: Some Days    Current packs/day: 0.50    Average packs/day: 0.5 packs/day for 15.0 years (7.5 ttl pk-yrs)    Types: Cigarettes   Smokeless tobacco: Never  Vaping Use  Vaping status: Never Used  Substance Use Topics   Alcohol use: Yes   Drug use: Yes    Types: Marijuana    Comment: undetermined amount   Allergies Dye fdc blue [brilliant blue fcf (fd&c blue #1)] and Other  Review of Systems Review of Systems  Constitutional:  Positive for fatigue.  Neurological:  Positive for seizures, weakness and headaches.    Physical Exam Vital Signs  I have reviewed the triage vital signs BP (!) 140/86   Pulse (!) 113   Temp 98.5 F (36.9 C)   Resp (!) 28   Ht 5' 2 (1.575 m)   Wt 62.1 kg   LMP  (LMP Unknown)   SpO2 100%   BMI 25.06 kg/m   Physical Exam Vitals and nursing note reviewed.  Constitutional:      General: She is not in acute distress.    Appearance: She is well-developed. She is ill-appearing.  HENT:     Head: Normocephalic and atraumatic.  Eyes:     Conjunctiva/sclera: Conjunctivae normal.  Cardiovascular:     Rate and Rhythm: Regular rhythm. Tachycardia present.     Heart sounds: No murmur heard. Pulmonary:     Effort: Pulmonary effort is normal. No respiratory distress.     Breath sounds: Normal breath sounds.  Abdominal:      Palpations: Abdomen is soft.     Tenderness: There is no abdominal tenderness.  Musculoskeletal:        General: No swelling.     Cervical back: Neck supple.  Skin:    General: Skin is warm and dry.     Capillary Refill: Capillary refill takes less than 2 seconds.  Neurological:     Mental Status: She is alert.     Motor: Weakness present.  Psychiatric:        Mood and Affect: Mood normal.     ED Results and Treatments Labs (all labs ordered are listed, but only abnormal results are displayed) Labs Reviewed  URINALYSIS, ROUTINE W REFLEX MICROSCOPIC - Abnormal; Notable for the following components:      Result Value   Color, Urine STRAW (*)    Specific Gravity, Urine 1.031 (*)    Glucose, UA >=500 (*)    Hgb urine dipstick MODERATE (*)    Protein, ur 30 (*)    All other components within normal limits  DIFFERENTIAL - Abnormal; Notable for the following components:   Neutro Abs 9.6 (*)    Abs Immature Granulocytes 0.16 (*)    All other components within normal limits  RAPID URINE DRUG SCREEN, HOSP PERFORMED - Abnormal; Notable for the following components:   Tetrahydrocannabinol POSITIVE (*)    All other components within normal limits  CBC - Abnormal; Notable for the following components:   WBC 14.0 (*)    All other components within normal limits  CBG MONITORING, ED - Abnormal; Notable for the following components:   Glucose-Capillary 515 (*)    All other components within normal limits  I-STAT CHEM 8, ED - Abnormal; Notable for the following components:   Glucose, Bld 500 (*)    Hemoglobin 16.0 (*)    HCT 47.0 (*)    All other components within normal limits  I-STAT VENOUS BLOOD GAS, ED - Abnormal; Notable for the following components:   pH, Ven 7.441 (*)    pCO2, Ven 39.9 (*)    Acid-Base Excess 3.0 (*)    Calcium , Ion 1.11 (*)    Hemoglobin 15.3 (*)  All other components within normal limits  I-STAT CG4 LACTIC ACID, ED - Abnormal; Notable for the  following components:   Lactic Acid, Venous 5.1 (*)    All other components within normal limits  HCG, SERUM, QUALITATIVE  COMPREHENSIVE METABOLIC PANEL WITH GFR  ETHANOL  PROTIME-INR  APTT  BETA-HYDROXYBUTYRIC ACID                                                                                                                          Radiology CT HEAD CODE STROKE WO CONTRAST Result Date: 03/28/2024 CLINICAL DATA:  Code stroke.  48 year old female.  Possible seizure. EXAM: CT HEAD WITHOUT CONTRAST TECHNIQUE: Contiguous axial images were obtained from the base of the skull through the vertex without intravenous contrast. RADIATION DOSE REDUCTION: This exam was performed according to the departmental dose-optimization program which includes automated exposure control, adjustment of the mA and/or kV according to patient size and/or use of iterative reconstruction technique. COMPARISON:  None Available. FINDINGS: Brain: Abnormal right frontal lobe, MCA territory. Posterior right MCA chronic appearing cortical encephalomalacia (series 2, image 22). Anterior to that abnormal mixed density obscuration of the normal right middle frontal gyrus architecture in an area of about 4 cm, series 2, image 20 and coronal image 42. Gray and white matter affected, although vasogenic edema there is not excluded. No midline shift, ventriculomegaly, intracranial mass effect. Basilar cisterns are patent. Outside of those areas gray-white matter differentiation appears normal. Vascular: No suspicious intracranial vascular hyperdensity. Skull: Intact.  No acute osseous abnormality identified. Sinuses/Orbits: Visualized paranasal sinuses and mastoids are clear. Other: No gaze deviation. Visualized orbits and scalp soft tissues are within normal limits. ASPECTS [facility] Stroke Program Early CT Score) Total score (0-10 with 10 being normal): Unclear whether tumor versus heterogeneous infarct right middle frontal gyrus. IMPRESSION:  Abnormal Right frontal lobe, with a combination of chronic posterior right MCA territory infarct (chronic encephalomalacia), but superimposed more anterior middle frontal gyrus 4 cm area of abnormal mixed density. And top differential considerations are recurrent right MCA infarct with hyperdense petechial hemorrhage, versus tumor with vasogenic edema. CTA is pending and delayed postcontrast images have been requested on that exam. Otherwise brain MRI without and with contrast would best characterize further. Study discussed by telephone with Dr. Dr. Aisha Seals on 03/28/2024 at 05:57 . Electronically Signed   By: VEAR Hurst M.D.   On: 03/28/2024 06:00    Pertinent labs & imaging results that were available during my care of the patient were reviewed by me and considered in my medical decision making (see MDM for details).  Medications Ordered in ED Medications  lactated ringers  bolus 1,000 mL (has no administration in time range)  LORazepam  (ATIVAN ) injection 1 mg (has no administration in time range)  levETIRAcetam  (KEPPRA ) undiluted injection 2,000 mg (has no administration in time range)  iohexol  (OMNIPAQUE ) 350 MG/ML injection 100 mL (100 mLs Intravenous Contrast Given 03/28/24 0607)  Procedures .Critical Care  Performed by: Albertina Dixon, MD Authorized by: Albertina Dixon, MD   Critical care provider statement:    Critical care time (minutes):  30   Critical care was necessary to treat or prevent imminent or life-threatening deterioration of the following conditions:  CNS failure or compromise   Critical care was time spent personally by me on the following activities:  Development of treatment plan with patient or surrogate, discussions with consultants, evaluation of patient's response to treatment, examination of patient, ordering and review of laboratory  studies, ordering and review of radiographic studies, ordering and performing treatments and interventions, pulse oximetry, re-evaluation of patient's condition and review of old charts   (including critical care time)  Medical Decision Making / ED Course   This patient presents to the ED for concern of weakness, this involves an extensive number of treatment options, and is a complaint that carries with it a high risk of complications and morbidity.  The differential diagnosis includes CVA, hemorrhagic stroke, mass, Todd's paralysis, seizure, electrolyte abnormality, encephalopathy, complicated migraine   MDM: Patient seen emerged part for evaluation of weakness and altered mental status.  Physical exam reveals a slightly disoriented patient that is completely flaccid on the left.  As patient is within a 24-hour window a stroke alert was called and patient taken immediately to the CAT scanner.  I consulted with the stroke neurologist Dr. Michaela and initial stroke imaging is quite concerning with the consolation of acute thrombus in both common carotid arteries with no intracranial LVO as well as multifocal abnormal enhancement in the right cerebral hemisphere concerning for subacute infarcts, chronic posterior right MCA infarct, petechial hemorrhage in the middle frontal gyrus.  Patient had an additional seizure episode while in the CT scanner and received Ativan  and Keppra .  Laboratory evaluation concerning with a leukocytosis to 14.0, glucose of 504 but anion gap is normal.  pH 7.4 and lactic acid 5.1.  Difficult to discern whether these abnormalities are secondary to seizure behavior or underlying sepsis.  Neurology concerned about severe hypercoagulability but given petechial hemorrhage, hesitant to start heparin  unless clots are seen elsewhere.  I placed a stat echo at the request of neurology to rule out endocarditis (septic or marantic) and expanded CT imaging to include CT PE and CT abdomen  pelvis venogram.  I also spoke with the intensivist team who will assist the neurology team for critical care management.  Patient will require hospital admission and patient admitted   Additional history obtained: -Additional history obtained from mother -External records from outside source obtained and reviewed including: Chart review including previous notes, labs, imaging, consultation notes   Lab Tests: -I ordered, reviewed, and interpreted labs.   The pertinent results include:   Labs Reviewed  URINALYSIS, ROUTINE W REFLEX MICROSCOPIC - Abnormal; Notable for the following components:      Result Value   Color, Urine STRAW (*)    Specific Gravity, Urine 1.031 (*)    Glucose, UA >=500 (*)    Hgb urine dipstick MODERATE (*)    Protein, ur 30 (*)    All other components within normal limits  DIFFERENTIAL - Abnormal; Notable for the following components:   Neutro Abs 9.6 (*)    Abs Immature Granulocytes 0.16 (*)    All other components within normal limits  RAPID URINE DRUG SCREEN, HOSP PERFORMED - Abnormal; Notable for the following components:   Tetrahydrocannabinol POSITIVE (*)    All other components within normal limits  CBC -  Abnormal; Notable for the following components:   WBC 14.0 (*)    All other components within normal limits  CBG MONITORING, ED - Abnormal; Notable for the following components:   Glucose-Capillary 515 (*)    All other components within normal limits  I-STAT CHEM 8, ED - Abnormal; Notable for the following components:   Glucose, Bld 500 (*)    Hemoglobin 16.0 (*)    HCT 47.0 (*)    All other components within normal limits  I-STAT VENOUS BLOOD GAS, ED - Abnormal; Notable for the following components:   pH, Ven 7.441 (*)    pCO2, Ven 39.9 (*)    Acid-Base Excess 3.0 (*)    Calcium , Ion 1.11 (*)    Hemoglobin 15.3 (*)    All other components within normal limits  I-STAT CG4 LACTIC ACID, ED - Abnormal; Notable for the following components:    Lactic Acid, Venous 5.1 (*)    All other components within normal limits  HCG, SERUM, QUALITATIVE  COMPREHENSIVE METABOLIC PANEL WITH GFR  ETHANOL  PROTIME-INR  APTT  BETA-HYDROXYBUTYRIC ACID      EKG   EKG Interpretation Date/Time:  Monday March 28 2024 05:36:53 EDT Ventricular Rate:  122 PR Interval:  173 QRS Duration:  73 QT Interval:  305 QTC Calculation: 435 R Axis:   1  Text Interpretation: Sinus tachycardia Baseline wander in lead(s) II III aVF Confirmed by Damyan Corne ([id number]) on 03/28/2024 6:49:57 AM         Imaging Studies ordered: I ordered imaging studies including CT head, CT angiogram I independently visualized and interpreted imaging. I agree with the radiologist interpretation  CT PE, CT venogram pending   Medicines ordered and prescription drug management: Meds ordered this encounter  Medications   lactated ringers  bolus 1,000 mL   iohexol  (OMNIPAQUE ) 350 MG/ML injection 100 mL   LORazepam  (ATIVAN ) injection 1 mg   levETIRAcetam  (KEPPRA ) undiluted injection 2,000 mg    -I have reviewed the patients home medicines and have made adjustments as needed  Critical interventions Stroke alert and consideration of TNK, fluid resuscitation, multiple emergent consultations  Consultations Obtained: I requested consultation with the neurologist on-call, intensivist team,  and discussed lab and imaging findings as well as pertinent plan - they recommend: Hypercoagulability workup, neuro ICU admission   Cardiac Monitoring: The patient was maintained on a cardiac monitor.  I personally viewed and interpreted the cardiac monitored which showed an underlying rhythm of: Sinus tachycardia  Social Determinants of Health:  Factors impacting patients care include: none   Reevaluation: After the interventions noted above, I reevaluated the patient and found that they have :stayed the same  Co morbidities that complicate the patient evaluation  Past Medical  History:  Diagnosis Date   DEPRESSION 10/18/2009   ENDOMETRIOSIS 10/18/2009   GERD 10/18/2009   History of HPV infection    MIGRAINE HEADACHE 10/18/2009   UNSPECIFIED PERIPHERAL VASCULAR DISEASE 10/18/2009      Dispostion: I considered admission for this patient, and patient will require hospital admission for stroke, hyperglycemia     Final Clinical Impression(s) / ED Diagnoses Final diagnoses:  None     @PCDICTATION @    Albertina Dixon, MD 03/28/24 [phone]

## 2024-03-28 NOTE — ED Notes (Signed)
 CCMD called.

## 2024-03-28 NOTE — Procedures (Signed)
 Patient Name: Terri [name]  MRN: [id number]  Epilepsy Attending: Pastor Falling  Referring Physician/Provider: No ref. provider found      Date: 03/28/2024 Duration: 23 minutes   Patient history:  48 y.o. 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 EEG to rule out subclinical seizures   Level of alertness: Drowsy, sleep, lethargic   AEDs during EEG study:   Technical aspects: This EEG study was done with scalp electrodes positioned according to the 10-20 International system of electrode placement. Electrical activity was reviewed with band pass filter of 1-70Hz , sensitivity of 7 uV/mm, display speed of 11mm/sec with a 60Hz  notched filter applied as appropriate. EEG data were recorded continuously and digitally stored.  Video monitoring was available and reviewed as appropriate.  Description: The posterior dominant rhythm consists of 12-13 Hz activity of moderate voltage (25-35 uV) seen predominantly in posterior head regions, symmetric and reactive to eye opening and eye closing. Drowsiness was characterized by attenuation of the posterior background rhythm. Sleep was characterized by vertex waves, sleep spindles (12 to 14 Hz), maximal frontocentral region.  There is an excessive amount of 15 to 18 Hz, 2-3 uV beta activity with irregular morphology distributed symmetrically and diffusely. Hyperventilation and photic stimulation were not performed.     ABNORMALITY - Excessive beta, generalized   IMPRESSION: This study is within normal limits. No seizures or epileptiform discharges were seen throughout the recording. The excessive beta activity seen in the background is most likely due to the effect of benzodiazepine and is a benign EEG pattern.  A normal interictal EEG does not exclude nor support the diagnosis of epilepsy.   Pastor Falling MD Neurology

## 2024-03-28 NOTE — H&P (Signed)
 NAME:  Terri [name], MRN:  [id number], DOB:  [birth year], LOS: 0 ADMISSION DATE:  03/28/2024, CONSULTATION DATE: 8/4 REFERRING MD: Dr. Albertina EDP, CHIEF COMPLAINT: Stroke  History of Present Illness:  48 year old female with PMH as below, which is significant for DVT, Bipolar, Schizoaffective disorder, and migraines presented to [facility] ED 8/4 with complaints of weakness and AMS. Onset of symptoms was approximately 8/3 around 2100. There were also witnessed episodes of seizure like activity (body tightening and arms jumping). Upon arrival to the ED she was found to have flaccid paralysis of the left upper and lower extremities. She was treated as a Code Stroke and was taken immediately to the CT scanner, which showed now LVO, but was positive for acute thrombus in both common carotid arteries as well as enhancement in the right cerebral hemisphere concerning for subacute infarcts. Petechial hemorrhage also seen. She was evaluated by neurology and had a witnessed seizure which was successfully abated with IV ativan . Heparin  infusion started by neurology after weighing risks considering petechial hemorrhage. PCCM asked to evaluate for ICU admission.   Pertinent  Medical History   has a past medical history of DEPRESSION (10/18/2009), ENDOMETRIOSIS (10/18/2009), GERD (10/18/2009), History of HPV infection, MIGRAINE HEADACHE (10/18/2009), and UNSPECIFIED PERIPHERAL VASCULAR DISEASE (10/18/2009).   Significant Hospital Events: Including procedures, antibiotic start and stop dates in addition to other pertinent events     Interim History / Subjective:    Objective    Blood pressure (!) 141/119, pulse (!) 120, temperature 98.5 F (36.9 C), resp. rate 15, height 5' 2 (1.575 m), weight 62.1 kg, SpO2 100%.        Intake/Output Summary (Last 24 hours) at 03/28/2024 0813 Last data filed at 03/28/2024 0801 Gross per 24 hour  Intake --  Output 1114 ml  Net -1114 ml   Filed Weights   03/28/24  0515  Weight: 62.1 kg    Examination: General: Middle aged female of normal body habitus HENT: [place]/AT, PERRL, no JVD Lungs: Clear bilateral breath sounds Cardiovascular: Tachy, regular, no MRG Abdomen: Soft, NT, ND Extremities: No acute deformity. Doppler able pulses with brisk cap refill LLE.  Neuro: Alert, oriented. Flaccid on the left. Left sided facial droop.   CT CTA head 8/4: acute thrombus in both common carotid arteries (larger on right), no intracranial LVO. Multifocal abnormal enhancement in the R cerebral hemisphere. Petechial hemorrhage in the presumed subacute right middle frontal gyrus area.  CTA chest: No evidence of PE. RLL edema vs atelectasis. Hazy opacity left lung base  CT venogram abd/pelvis:  L superficial femoral artery occlusion. Periumbilical fat-containing hernia.   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  - Consider bubble study. - EEG  Seizure - PRN ativan  - Keppra  2g loaded in ED, then 1 g BID - Seizure precautions  Hyperglycemia with history of DM (A1C 8.5) - Start insulin  infusion - Hourly CBG per endotool protocol  L superior femoral artery occlusion - Heparin  - Vascular surgery consulted by EDP  Lactic acidosis: likely Dt sz - trend lactic.   Leukocytosis - BC pending - Defer ABX for now  Nutrition - NPO - SLP eval  Schizoaffective Bipolar THC use - Holding home olanzapine   Best Practice (right click and Reselect all SmartList Selections daily)   Diet/type: NPO DVT prophylaxis systemic heparin  Pressure ulcer(s): pressure ulcer assessment deferred  GI prophylaxis: N/A Lines: N/A Foley:  N/A Code Status:  full code Last  date of multidisciplinary goals of care discussion []   Labs   CBC: Recent Labs  Lab 03/28/24 0538 03/28/24 0546 03/28/24 0548  WBC 14.0*  --   --   NEUTROABS 9.6*  --   --   HGB 13.7 16.0* 15.3*  HCT 40.9 47.0* 45.0  MCV 88.1  --   --   PLT 341  --   --     Basic Metabolic Panel: Recent Labs  Lab 03/28/24 0524 03/28/24 0546 03/28/24 0548  NA 136 138 137  K 3.5 3.7 3.7  CL 99 98  --   CO2 24  --   --   GLUCOSE 504* 500*  --   BUN 6 7  --   CREATININE 0.95 0.80  --   CALCIUM  9.3  --   --    GFR: Estimated Creatinine Clearance: 77 mL/min (by C-G formula based on SCr of 0.8 mg/dL). Recent Labs  Lab 03/28/24 0538 03/28/24 0548  WBC 14.0*  --   LATICACIDVEN  --  5.1*    Liver Function Tests: Recent Labs  Lab 03/28/24 0524  AST 22  ALT 10  ALKPHOS 93  BILITOT 0.7  PROT 6.8  ALBUMIN 3.2*   No results for input(s): LIPASE, AMYLASE in the last 168 hours. No results for input(s): AMMONIA in the last 168 hours.  ABG    Component Value Date/Time   HCO3 27.2 03/28/2024 0548   TCO2 28 03/28/2024 0548   O2SAT 72 03/28/2024 0548     Coagulation Profile: Recent Labs  Lab 03/28/24 0538  INR 0.9    Cardiac Enzymes: No results for input(s): CKTOTAL, CKMB, CKMBINDEX, TROPONINI in the last 168 hours.  HbA1C: Hgb A1c MFr Bld  Date/Time Value Ref Range Status  07/08/2019 04:42 PM 8.5 (H) 4.8 - 5.6 % Final    Comment:    (NOTE) Pre diabetes:          5.7%-6.4% Diabetes:              >6.4% Glycemic control for   <7.0% adults with diabetes     CBG: Recent Labs  Lab 03/28/24 0516  GLUCAP 515*    Review of Systems:   Bolds are positive  Constitutional: weight loss, gain, night sweats, Fevers, chills, fatigue .  HEENT: headaches, Sore throat, sneezing, nasal congestion, post nasal drip, Difficulty swallowing, Tooth/dental problems, visual complaints visual changes, ear ache CV:  chest pain, radiates:,Orthopnea, PND, swelling in lower  extremities, dizziness, palpitations, syncope.  GI  heartburn, indigestion, abdominal pain, nausea, vomiting, diarrhea, change in bowel habits, loss of appetite, bloody stools.  Resp: cough, productive: , hemoptysis, dyspnea, chest pain, pleuritic.  Skin: rash or itching or icterus GU: dysuria, change in color of urine, urgency or frequency. flank pain, hematuria  MS: joint pain or swelling. decreased range of motion  Psych: change in mood or affect. depression or anxiety.  Neuro: difficulty with speech, weakness, numbness, ataxia    Past Medical History:  She,  has a past medical history of DEPRESSION (10/18/2009), ENDOMETRIOSIS (10/18/2009), GERD (10/18/2009), History of HPV infection, MIGRAINE HEADACHE (10/18/2009), and UNSPECIFIED PERIPHERAL VASCULAR DISEASE (10/18/2009).   Surgical History:   Past Surgical History:  Procedure Laterality Date   APPENDECTOMY     BOWEL  RESECTION     r/t endometriosis   OVARIAN CYST REMOVAL     bilateral     Social History:   reports that she has been smoking cigarettes. She has a 7.5 pack-year smoking history. She has never used smokeless tobacco. She reports current alcohol use. She reports current drug use. Drug: Marijuana.   Family History:  Her family history includes Diabetes in her father and mother; Heart disease (age of onset: 18) in her father; Hyperlipidemia in her mother; Hypertension in her father; Kidney disease in her father; Prostate cancer in her father.   Allergies Allergies  Allergen Reactions   Dye Fdc Blue [Brilliant Blue Fcf (Fd&C Blue #1)]     MRI, caused hives   Other Other (See Comments)    Zole:nausea     Home Medications  Prior to Admission medications   Medication Sig Start Date End Date Taking? Authorizing Provider  metFORMIN  (GLUCOPHAGE ) 500 MG tablet Take 1 tablet (500 mg total) by mouth 2 (two) times daily with a meal. 07/12/19   Starkes-Perry, Majel RAMAN, FNP  OLANZapine  zydis (ZYPREXA ) 10 MG disintegrating tablet  Take 1 tablet (10 mg total) by mouth at bedtime. 07/12/19   Wilkie Majel RAMAN, FNP     Critical care time: 46 minutes     [name], AGACNP-BC [facility]  See Amion for personal pager PCCM on call pager [phone] until 7pm. Please call Elink 7p-7a. [phone]  03/28/2024 8:46 AM

## 2024-03-28 NOTE — ED Notes (Addendum)
 Pt in restroom, tech at side, pt weak and not responding, caught by tech and assisted by staff into chair. Pt not moving all extremities, extremities clenched, left sided facial drooping noted, pt drooling. Episode lasted approximately 10-15 seconds. Pt to rm 16.

## 2024-03-28 NOTE — Evaluation (Signed)
 Speech Language Pathology Evaluation Patient Details Name: Terri [name] MRN: [id number] DOB: Jan 15, 1976 Today's Date: 03/28/2024 Time: 0950-1005 SLP Time Calculation (min) (ACUTE ONLY): 15 min  Problem List:  Patient Active Problem List   Diagnosis Date Noted   Acute stroke due to occlusion of right carotid artery (HCC) 03/28/2024   Schizoaffective disorder (HCC) 07/08/2019   Mixed bipolar I disorder (HCC) 07/08/2019   Cannabis abuse 07/08/2019   Vaginitis 12/02/2011   Obesity 12/02/2011   Dysmenorrhea 12/02/2011   DEPRESSION 10/18/2009   MIGRAINE HEADACHE 10/18/2009   UNSPECIFIED PERIPHERAL VASCULAR DISEASE 10/18/2009   GERD 10/18/2009   ENDOMETRIOSIS 10/18/2009   Past Medical History:  Past Medical History:  Diagnosis Date   DEPRESSION 10/18/2009   ENDOMETRIOSIS 10/18/2009   GERD 10/18/2009   History of HPV infection    MIGRAINE HEADACHE 10/18/2009   UNSPECIFIED PERIPHERAL VASCULAR DISEASE 10/18/2009   Past Surgical History:  Past Surgical History:  Procedure Laterality Date   APPENDECTOMY     BOWEL RESECTION     r/t endometriosis   OVARIAN CYST REMOVAL     bilateral   HPI:  Pt is a 48 year old female who presented on 8/4 with complaints of weakness and AMS. 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 PMH: DVT, Bipolar, Schizoaffective disorder, and migraines.   Assessment / Plan / Recommendation Clinical Impression  Pt participated in speech-language-cognition evaluation with her mother present for part of the evaluation. Pt stated that she completed three years of college, lives with her mother, and is currently unemployed, but previously worked as a Solicitor. The [facility] Mental Status Examination was completed to evaluate the pt's cognitive-linguistic skills. She achieved  a score of 7/30 which is below the normal limits of 27 or more out of 30. She exhibited difficulty in the areas of intellectual awareness, sustained attention, memory, executive function (i.e., reasoning, organization, and complex problem solving). Reduced vocal intensity negatively impacted speech intelligibility, but articulatory precision was adequate. 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 Skilled SLP services are clinically indicated at this time to improve cognitive-linguistic function.    SLP Assessment  SLP Recommendation/Assessment: Patient needs continued Speech Language Pathology Services SLP Visit Diagnosis: Cognitive communication deficit (R41.841); apragmatism     Assistance Recommended at Discharge  Frequent or constant Supervision/Assistance  Functional Status Assessment Patient has had a recent decline in their functional status and demonstrates the ability to make significant improvements in function in a reasonable and predictable amount of time.  Frequency and Duration min 2x/week  2 weeks      SLP Evaluation Cognition  Overall Cognitive Status: Impaired/Different from baseline Arousal/Alertness: Awake/alert Orientation Level: Oriented X4 Year: 2025 Month: August Day of Week: Correct Attention: Focused;Sustained Focused Attention: Appears intact Sustained Attention: Impaired Sustained Attention Impairment: Verbal complex Memory: Impaired Memory Impairment: Retrieval deficit;Decreased recall of new information (Immediate: 5/5; delayed: 0/5; with cues: 2/5) Awareness: Impaired Awareness Impairment: Intellectual impairment Problem Solving: Impaired Problem Solving Impairment: Verbal complex (0/2) Executive Function: Sequencing;Organizing;Reasoning Reasoning: Impaired Reasoning Impairment: Verbal complex Sequencing:  Impaired Sequencing Impairment: Verbal complex Organizing: Impaired Organizing Impairment: Verbal complex       Comprehension  Auditory Comprehension Yes/No Questions: Within Functional Limits Conversation: Simple Interfering Components: Attention;Processing speed;Working [employer] fast streamer  Expression Expression Primary Mode of Expression: Verbal Verbal Expression Overall Verbal Expression: Impaired Initiation: No impairment Level of Generative/Spontaneous Verbalization: Conversation Repetition: No impairment Naming: No impairment Pragmatics: Impairment Impairments: Abnormal affect;Eye contact;Topic maintenance Interfering Components: Attention   Oral / Motor  Oral Motor/Sensory Function Overall Oral Motor/Sensory Function: Mild impairment Facial ROM: Reduced left;Suspected CN VII (facial) dysfunction Facial Symmetry: Abnormal symmetry left;Suspected CN VII (facial) dysfunction Facial Strength: Reduced left;Suspected CN VII (facial) dysfunction Facial Sensation: Within Functional Limits Lingual ROM: Reduced left;Suspected CN XII (hypoglossal) dysfunction Lingual Symmetry: Abnormal symmetry left;Suspected CN XII (hypoglossal) dysfunction Lingual Strength: Reduced;Suspected CN XII (hypoglossal) dysfunction Lingual Sensation: Within Functional Limits Motor Speech Respiration: Within functional limits Phonation: Low vocal intensity Resonance: Within functional limits Articulation: Within functional limitis (though difficult to judge due to reduced vocal intensity) Intelligibility: Intelligibility reduced (mostly due to low vocal intensity)           Hashir Deleeuw I. [place], MS, CCC-SLP Acute Rehabilitation Services Office number [phone]  Thea LILLETTE [place] 03/28/2024, 11:11 AM

## 2024-03-28 NOTE — Progress Notes (Signed)
  Echocardiogram 2D Echocardiogram has been performed.  Terri [name] 03/28/2024, 4:29 PM

## 2024-03-28 NOTE — Code Documentation (Signed)
 Stroke Response Nurse Documentation Code Documentation  Terri [name] is a 48 y.o. female arriving to [facility]  via [employer] on 03/28/24 with past medical hx of DM, DVT, migraines, bipolar, schizoaffective disorder, PVD, GERD . On No antithrombotic. Code stroke was activated by ED.   Patient from home where she was LKW at 2100 and now complaining of Left sided weakness. Pt came in to ED c/o LE weakness and dizziness. In triage pt noted to have episode in bathroom of body becoming stiff and L gaze.    Stroke team at the bedside on patient arrival. Labs drawn and patient cleared for CT. Patient to CT with team. NIHSS 6, see documentation for details and code stroke times. The following imaging was completed:  CT Head, CTA, and CTP. Patient is not a candidate for IV Thrombolytic due to outside window. Patient is not a candidate for IR due to no LVO.   Care Plan: q1h vitals and neuro checks with pupils.    Bedside handoff with ED RN.    Nat Blunt  Stroke Response RN

## 2024-03-28 NOTE — Progress Notes (Signed)
 STROKE TEAM PROGRESS NOTE   SUBJECTIVE (INTERVAL HISTORY) Her RN is at the bedside.  Overall her condition is stable.  Patient still complaining of left-sided weakness and numbness.  She does have mild leftfacial droop.  On heparin  IV.  MRI pending.   OBJECTIVE Temp:  [98 F (36.7 C)-98.5 F (36.9 C)] 98.1 F (36.7 C) (08/04 1600) Pulse Rate:  [103-131] 103 (08/04 1800) Cardiac Rhythm: Sinus tachycardia (08/04 1600) Resp:  [13-31] 24 (08/04 1800) BP: (126-147)/(78-119) 138/88 (08/04 1800) SpO2:  [96 %-100 %] 99 % (08/04 1800) Weight:  [62.1 kg] 62.1 kg (08/04 0515)  Recent Labs  Lab 03/28/24 1155 03/28/24 1259 03/28/24 1407 03/28/24 1522 03/28/24 1617  GLUCAP 236* 214* 132* 169* 145*   Recent Labs  Lab 03/28/24 0524 03/28/24 0546 03/28/24 0548  NA 136 138 137  K 3.5 3.7 3.7  CL 99 98  --   CO2 24  --   --   GLUCOSE 504* 500*  --   BUN 6 7  --   CREATININE 0.95 0.80  --   CALCIUM  9.3  --   --    Recent Labs  Lab 03/28/24 0524  AST 22  ALT 10  ALKPHOS 93  BILITOT 0.7  PROT 6.8  ALBUMIN 3.2*   Recent Labs  Lab 03/28/24 0538 03/28/24 0546 03/28/24 0548  WBC 14.0*  --   --   NEUTROABS 9.6*  --   --   HGB 13.7 16.0* 15.3*  HCT 40.9 47.0* 45.0  MCV 88.1  --   --   PLT 341  --   --    No results for input(s): CKTOTAL, CKMB, CKMBINDEX, TROPONINI in the last 168 hours. Recent Labs    03/28/24 0538  LABPROT 13.0  INR 0.9   Recent Labs    03/28/24 0542  COLORURINE STRAW*  LABSPEC 1.031*  PHURINE 7.0  GLUCOSEU >=500*  HGBUR MODERATE*  BILIRUBINUR NEGATIVE  KETONESUR NEGATIVE  PROTEINUR 30*  NITRITE NEGATIVE  LEUKOCYTESUR NEGATIVE       Component Value Date/Time   CHOL 256 (H) 07/09/2019 0653   TRIG 492 (H) 07/09/2019 0653   HDL 31 (L) 07/09/2019 0653   CHOLHDL 8.3 07/09/2019 0653   VLDL UNABLE TO CALCULATE IF TRIGLYCERIDE OVER 400 mg/dL 88/85/7979 9346   LDLCALC UNABLE TO CALCULATE IF TRIGLYCERIDE OVER 400 mg/dL 88/85/7979 9346    Lab Results  Component Value Date   HGBA1C 8.5 (H) 07/08/2019      Component Value Date/Time   LABOPIA NONE DETECTED 03/28/2024 0542   COCAINSCRNUR NONE DETECTED 03/28/2024 0542   LABBENZ NONE DETECTED 03/28/2024 0542   AMPHETMU NONE DETECTED 03/28/2024 0542   THCU POSITIVE (A) 03/28/2024 0542   LABBARB NONE DETECTED 03/28/2024 0542    Recent Labs  Lab 03/28/24 0524  ETH <15    I have personally reviewed the radiological images below and agree with the radiology interpretations.  ECHOCARDIOGRAM LIMITED BUBBLE STUDY Result Date: 03/28/2024    ECHOCARDIOGRAM LIMITED REPORT   Patient Name:   Terri [name] Date of Exam: 03/28/2024 Medical Rec #:  [id number]           Height:       62.0 in Accession #:    [id number]          Weight:       137.0 lb Date of Birth:  02/24/1976          BSA:          1.628  m Patient Age:    47 years            BP:           132/81 mmHg Patient Gender: F                   HR:           102 bpm. Exam Location:  Inpatient Procedure: Limited Echo (Both Spectral and Color Flow Doppler were utilized            during procedure). Indications:    patent foramen ovale  History:        Patient has prior history of Echocardiogram examinations, most                 recent 03/28/2024. Risk Factors:Current Smoker.  Sonographer:    Tinnie Barefoot RDCS Referring Phys: [id number] MURALI RAMASWAMY  Conclusion(s)/Recommendation(s): Limited study to assess for intercardiac shunting. Limited visualization, but no large shunt seen. Consider TEE if further evaluation needed. FINDINGS  Left Ventricle: IAS/Shunts: Agitated saline contrast was given intravenously to evaluate for intracardiac shunting. Morene Brownie Electronically signed by Morene Brownie Signature Date/Time: 03/28/2024/4:45:10 PM    Final    CT HEAD WO CONTRAST ( ) Result Date: 03/28/2024 EXAM: CT HEAD WITHOUT 03/28/2024 12:50:40 PM TECHNIQUE: CT of the head was performed without the administration of intravenous  contrast. Automated exposure control, iterative reconstruction, and/or weight based adjustment of the mA/kV was utilized to reduce the radiation dose to as low as reasonably achievable. COMPARISON: CT angiogram of the head and neck dated 03/28/2024 and CT of the head dated 03/28/2024. CLINICAL HISTORY: Stroke, follow up. FINDINGS: BRAIN AND VENTRICLES: No acute intracranial hemorrhage. No mass effect or midline shift. No extra-axial fluid collection. No hydrocephalus. Encephalomalacia changes again demonstrated within the right parietal lobe. There is increased attenuation within the cortex of the right frontal lobe and obscuration of the cerebral sulci suggesting contrast staining from either regional cerebritis or ischemic tissue. ORBITS: No acute abnormality. SINUSES AND MASTOIDS: No acute abnormality. SOFT TISSUES AND SKULL: No acute skull fracture. No acute soft tissue abnormality. IMPRESSION: 1. Encephalomalacia changes in the right parietal lobe. 2. Increased attenuation in the right frontal lobe cortex and obscuration of the cerebral sulci, suggestive of contrast staining from either regional cerebritis or ischemic tissue. Electronically signed by: evalene coho 03/28/2024 01:09 PM EDT RP Workstation: [id number]   EEG adult Result Date: 03/28/2024 Gregg Lek, MD     03/28/2024 11:42 AM Patient Name: Terri [name] MRN: [id number] Epilepsy Attending: Lek Gregg Referring Physician/Provider: No ref. provider found     Date: 03/28/2024 Duration: 23 minutes Patient history:  48 y.o. 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 EEG to rule out subclinical seizures Level of alertness: Drowsy, sleep, lethargic AEDs during EEG study: Technical aspects: This EEG study was done with scalp electrodes positioned according to the 10-20 International system of electrode placement.  Electrical activity was reviewed with band pass filter of 1-70Hz , sensitivity of 7 uV/mm, display speed of 58mm/sec with a 60Hz  notched filter applied as appropriate. EEG data were recorded continuously and digitally stored.  Video monitoring was available and reviewed as appropriate. Description: The posterior dominant rhythm consists of 12-13 Hz activity of moderate voltage (25-35 uV) seen predominantly in posterior head regions, symmetric and reactive to eye opening and eye closing. Drowsiness was characterized by  attenuation of the posterior background rhythm. Sleep was characterized by vertex waves, sleep spindles (12 to 14 Hz), maximal frontocentral region.  There is an excessive amount of 15 to 18 Hz, 2-3 uV beta activity with irregular morphology distributed symmetrically and diffusely. Hyperventilation and photic stimulation were not performed.   ABNORMALITY - Excessive beta, generalized IMPRESSION: This study is within normal limits. No seizures or epileptiform discharges were seen throughout the recording. The excessive beta activity seen in the background is most likely due to the effect of benzodiazepine and is a benign EEG pattern. A normal interictal EEG does not exclude nor support the diagnosis of epilepsy. Pastor Falling MD Neurology    ECHOCARDIOGRAM COMPLETE Result Date: 03/28/2024    ECHOCARDIOGRAM REPORT   Patient Name:   Terri [name] Date of Exam: 03/28/2024 Medical Rec #:  [id number]           Height:       62.0 in Accession #:    [id number]          Weight:       137.0 lb Date of Birth:  Jan 15, 1976          BSA:          1.628 m Patient Age:    47 years            BP:           141/119 mmHg Patient Gender: F                   HR:           120 bpm. Exam Location:  Inpatient Procedure: 2D Echo, Cardiac Doppler and Color Doppler (Both Spectral and Color            Flow Doppler were utilized during procedure). Indications:    Stroke  History:        Patient has no prior history of  Echocardiogram examinations. No                 relevant cardiac history.  Sonographer:    Therisa Crouch Referring Phys: [id number] MADISON KOMMOR  Sonographer Comments: Technically difficult study due to poor echo windows. IMPRESSIONS  1. Mild intercavitary gradient noted (<82mmHg) due to hyperdynamic and underfilled LV. Left ventricular ejection fraction, by estimation, is 70 to 75%. The left ventricle has hyperdynamic function. The left ventricle has no regional wall motion abnormalities. Indeterminate diastolic filling due to E-A fusion.  2. Right ventricular systolic function is normal. The right ventricular size is normal.  3. The mitral valve is normal in structure. No evidence of mitral valve regurgitation. No evidence of mitral stenosis.  4. The aortic valve is tricuspid. Aortic valve regurgitation is not visualized. Aortic valve sclerosis is present, with no evidence of aortic valve stenosis.  5. The inferior vena cava is normal in size with greater than 50% respiratory variability, suggesting right atrial pressure of 3 mmHg. FINDINGS  Left Ventricle: Mild intercavitary gradient noted (<43mmHg) due to hyperdynamic and underfilled LV. Left ventricular ejection fraction, by estimation, is 70 to 75%. The left ventricle has hyperdynamic function. The left ventricle has no regional wall motion abnormalities. The left ventricular internal cavity size was normal in size. There is no left ventricular hypertrophy. Indeterminate diastolic filling due to E-A fusion. Right Ventricle: The right ventricular size is normal. No increase in right ventricular wall thickness. Right ventricular systolic function is normal. Left Atrium: Left atrial size was normal in size. Right  Atrium: Right atrial size was normal in size. Pericardium: There is no evidence of pericardial effusion. Mitral Valve: The mitral valve is normal in structure. No evidence of mitral valve regurgitation. No evidence of mitral valve stenosis. Tricuspid  Valve: The tricuspid valve is normal in structure. Tricuspid valve regurgitation is not demonstrated. No evidence of tricuspid stenosis. Aortic Valve: The aortic valve is tricuspid. Aortic valve regurgitation is not visualized. Aortic valve sclerosis is present, with no evidence of aortic valve stenosis. Aortic valve mean gradient measures 3.0 mmHg. Aortic valve peak gradient measures 6.1  mmHg. Aortic valve area, by VTI measures 1.84 cm. Pulmonic Valve: The pulmonic valve was normal in structure. Pulmonic valve regurgitation is not visualized. No evidence of pulmonic stenosis. Aorta: The aortic root is normal in size and structure. Venous: The inferior vena cava is normal in size with greater than 50% respiratory variability, suggesting right atrial pressure of 3 mmHg. IAS/Shunts: The interatrial septum was not well visualized.  LEFT VENTRICLE PLAX 2D LVIDd:         4.40 cm LVIDs:         3.10 cm LV PW:         0.90 cm LV IVS:        1.00 cm LVOT diam:     1.80 cm LV SV:         35 LV SV Index:   21 LVOT Area:     2.54 cm  IVC IVC diam: 0.90 cm LEFT ATRIUM             Index LA diam:        2.70 cm 1.66 cm/m LA Vol (A2C):   21.5 ml 13.21 ml/m LA Vol (A4C):   27.7 ml 17.02 ml/m LA Biplane Vol: 26.2 ml 16.10 ml/m  AORTIC VALVE AV Area (Vmax):    1.81 cm AV Area (Vmean):   1.88 cm AV Area (VTI):     1.84 cm AV Vmax:           123.00 cm/s AV Vmean:          86.900 cm/s AV VTI:            0.189 m AV Peak Grad:      6.1 mmHg AV Mean Grad:      3.0 mmHg LVOT Vmax:         87.50 cm/s LVOT Vmean:        64.200 cm/s LVOT VTI:          0.137 m LVOT/AV VTI ratio: 0.72  AORTA Ao Root diam: 3.00 cm Ao Asc diam:  2.60 cm  SHUNTS Systemic VTI:  0.14 m Systemic Diam: 1.80 cm Morene Brownie Electronically signed by Morene Brownie Signature Date/Time: 03/28/2024/8:49:23 AM    Final    CT VENOGRAM ABD/PELVIS/LOWER EXT BILAT Addendum Date: 03/28/2024 ADDENDUM #1 ADDENDUM: The above findings were discussed with Dr. Simon at  07:41 am 03/28/2024. ---------------------------------------------------- Electronically signed by: timothy berrigan 03/28/2024 07:45 AM EDT RP Workstation: [id number]   Result Date: 03/28/2024 ORIGINAL REPORT  EXAM: CTA ABDOMEN AND PELVIS WITH CONTRAST AND RUNOFF CTA OF THE LOWER EXTREMITIES WITH CONTRAST 03/28/2024 07:09:15 AM TECHNIQUE: CTA images of the abdomen, pelvis and lower extremities with intravenous contrast. Three-dimensional MIP/volume rendered formations were performed. Automated exposure control, iterative reconstruction, and/or weight based adjustment of the mA/kV was utilized to reduce the radiation dose to as low as reasonably achievable. COMPARISON: None available. CLINICAL HISTORY: Deep venous thrombosis (DVT). FINDINGS: VASCULATURE: AORTA: No  acute finding. No abdominal aortic aneurysm. No dissection. There is moderate calcific atheromatous disease within the aortoiliac arteries. CELIAC TRUNK: No acute finding. No occlusion or significant stenosis. SUPERIOR MESENTERIC ARTERY: No acute finding. No occlusion or significant stenosis. The mesenteric arteries are patent. INFERIOR MESENTERIC ARTERY: No acute finding. No occlusion or significant stenosis. RENAL ARTERIES: No acute finding. No occlusion or significant stenosis. The renal arteries are patent. RIGHT ILIAC ARTERIES: No acute finding. No occlusion or significant stenosis. RIGHT FEMORAL ARTERIES: No acute finding. No occlusion or significant stenosis. RIGHT POPLITEAL ARTERY: No acute finding. No occlusion or significant stenosis. RIGHT CALF ARTERIES: No acute finding. No occlusion or significant stenosis. LEFT ILIAC ARTERIES: No acute finding. No occlusion or significant stenosis. LEFT FEMORAL ARTERIES: The left superficial femoral artery is completely occluded. LEFT POPLITEAL ARTERY: No acute finding. No occlusion or significant stenosis. LEFT CALF ARTERIES: No acute finding. No occlusion or significant stenosis. ABDOMEN AND PELVIS: LOWER  CHEST: There are ground-glass and streaky opacities present within the right lower lobe and ground-glass opacities present dependently within the left lower lobe. LIVER: The liver is unremarkable. GALLBLADDER AND BILE DUCTS: Gallbladder is unremarkable. No biliary ductal dilatation. SPLEEN: The spleen is unremarkable. PANCREAS: The pancreas is unremarkable. ADRENAL GLANDS: Bilateral adrenal glands demonstrate no acute abnormality. KIDNEYS, URETERS AND BLADDER: No stones in the kidneys or ureters. No hydronephrosis. No evidence of perinephric or periureteral stranding. Urinary bladder is unremarkable. There is left renal cortical atrophy and scarring. GI AND Bowel: Stomach and duodenal sweep demonstrate no acute abnormality. There is no bowel obstruction. No abnormal bowel wall thickening or distension. The patient is status post partial colectomy. Anastomosis sutures are noted in the right pelvis. REPRODUCTIVE: Reproductive organs are unremarkable. PERITONEUM AND RETRPERITONEUM: No ascites or free air. A periumbilical fat-containing hernia is present. LYMPH NODES: No evidence of lymphadenopathy. BONES AND SOFT TISSUES: No acute abnormality of the bones. No acute soft tissue abnormality. VASCULATURE: The inferior vena cava and iliac veins are patent as are the femoral veins. IMPRESSION: 1. Left superficial femoral artery occlusion. 2. 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 3. Ground-glass and streaky opacities in the right lower lobe and ground-glass opacities in the left lower lobe. 4. Left renal cortical atrophy and scarring. 5. Periumbilical fat-containing hernia. 6. Status post partial colectomy with anastomosis sutures in the right pelvis. Electronically signed by: evalene coho 03/28/2024 07:33 AM EDT RP Workstation: [id number]   DG Chest Portable 1 View Result Date: 03/28/2024 EXAM: 1 VIEW XRAY OF THE CHEST 03/28/2024 07:22:00 AM COMPARISON: 07/06/2009 CLINICAL HISTORY:  Concern for PNA. FINDINGS: LUNGS AND PLEURA: Vascular low lung volumes with some crowding of perihilar and bibasilar bronchovascular structures. No focal pulmonary opacity. No pulmonary edema. No pleural effusion. No pneumothorax. HEART AND MEDIASTINUM: No acute abnormality of the cardiac and mediastinal silhouettes. BONES AND SOFT TISSUES: No acute osseous abnormality. IMPRESSION: 1. No acute findings. 2. Low lung volumes with crowding of perihilar and bibasilar bronchovascular structures. Electronically signed by: Katheleen Faes MD 03/28/2024 07:27 AM EDT RP Workstation: [id number]   CT Angio Chest PE W and/or Wo Contrast Result Date: 03/28/2024 EXAM: CTA of the Chest with contrast for PE 03/28/2024 07:02:57 AM TECHNIQUE: CTA of the chest was performed after the administration of intravenous contrast. Multiplanar reformatted images are provided for review. MIP images are provided for review. Automated exposure control, iterative reconstruction, and/or weight based adjustment of the mA/kV was utilized to reduce the radiation dose to as low as reasonably achievable. COMPARISON: PA  and lateral radiographs of chest dated 07/06/2009. CLINICAL HISTORY: Pulmonary embolism (PE) suspected, high prob. FINDINGS: PULMONARY ARTERIES: Pulmonary arteries are adequately opacified for evaluation. No pulmonary embolism. Main pulmonary artery is normal in caliber. MEDIASTINUM: The heart and pericardium demonstrate no acute abnormality. There is no acute abnormality of the thoracic aorta. LYMPH NODES: No mediastinal, hilar or axillary lymphadenopathy. LUNGS AND PLEURA: There are hazy and streaky opacities present within the base of the right lower lobe, likely representing a combination of edema and atelectasis. There are also hazy opacities present in the left lung base. The nondependent lungs are clear. No pleural effusion or pneumothorax. UPPER ABDOMEN: Limited images of the upper abdomen are unremarkable. SOFT TISSUES AND BONES:  No acute bone or soft tissue abnormality. IMPRESSION: 1. No evidence of pulmonary embolism. 2. Hazy and streaky opacities in the right lower lobe base, likely representing a combination of edema and atelectasis. 3. Hazy opacities in the left lung base. Electronically signed by: evalene coho 03/28/2024 07:19 AM EDT RP Workstation: [id number]   CT ANGIO HEAD NECK W WO CM W PERF (CODE STROKE) Result Date: 03/28/2024 CLINICAL DATA:  48 year old female code stroke presentation. EXAM: CT ANGIOGRAPHY HEAD AND NECK CT PERFUSION BRAIN TECHNIQUE: Multidetector CT imaging of the head and neck was performed using the standard protocol during bolus administration of intravenous contrast. Multiplanar CT image reconstructions and MIPs were obtained to evaluate the vascular anatomy. Carotid stenosis measurements (when applicable) are obtained utilizing NASCET criteria, using the distal internal carotid diameter as the denominator. Multiphase CT imaging of the brain was performed following IV bolus contrast injection. Subsequent parametric perfusion maps were calculated using RAPID software. RADIATION DOSE REDUCTION: This exam was performed according to the departmental dose-optimization program which includes automated exposure control, adjustment of the mA and/or kV according to patient size and/or use of iterative reconstruction technique. CONTRAST:  OMNIPAQUE  IOHEXOL  350 MG/ML SOLN COMPARISON:  Plain head CT today 0550 hours. FINDINGS: CT Brain Perfusion Findings: ASPECTS: Unclear tumor versus infarct right middle frontal gyrus. CBF (<30%) Volume: Zero, with no parameter abnormality detected. Perfusion (Tmax>6.0s) volume: Zero, with no parameter abnormality detected. Mismatch Volume: Not applicablemL Infarction Location:Not applicable CTA NECK Skeleton: Carious dentition. No acute or suspicious osseous lesion identified. Upper chest: Negative. Other neck: Nonvascular neck soft tissue spaces are within normal limits,  no convincing neck mass or lymphadenopathy. Aortic arch: 4 vessel arch. Non dominant left vertebral artery arises directly from the arch. Mild calcified arch atherosclerosis is visible. Right carotid system: Streak artifact dense right subclavian venous contrast obscures the brachiocephalic artery and right CCA origin. Beginning in the right Common carotid artery at the level of the larynx there is abundant adherent low-density thrombus along the medial wall (series 12, image 110). Large volume of clot continues to the right carotid bifurcation, a distance of about 4 cm (series 14, image 52 with clot centered in the vessel on series 12, image 92. The thrombus however primarily extends into the right ECA at the bifurcation (series 12, image 88). Superimposed calcified posterior right ICA origin atherosclerosis. Right ICA is patent without stenosis to the skull base. Left carotid system: Negative left CCA origin. Positive for distal left CCA thrombus extending along a segment of about 2 cm beginning at the hypopharynx on series 2, image 97. This abates at the left carotid bifurcation. And left ICA origin is patent without stenosis. Vertebral arteries: Obscured proximal right subclavian artery. Normal right vertebral artery origin. Dominant right vertebral artery is patent and  negative to the skull base. Non dominant left vertebral artery is diminutive and arises directly from the arch. Late entry into the left transverse foramen. Left vertebral remains highly diminutive but patent to the skull base. CTA HEAD Posterior circulation: Diminutive left vertebral artery terminates in PICA. Right vertebral artery with patent PICA origin supplies the basilar. Both the right V4 segment and the basilar are diminutive but patent. No basilar stenosis identified. Fetal type PCA origins, especially the right. The basilar artery appears to terminates in patent SCA origins. Bilateral posterior communicating arteries and bilateral PCA  branches are patent and within normal limits. Anterior circulation: Both ICA siphons are patent with no plaque or stenosis. The right siphon appears somewhat dominant. Both posterior communicating artery origins are normal. Patent carotid termini, MCA and ACA origins. Mildly dominant right ACA A1 segment. Diminutive or absent anterior communicating artery. Bilateral ACA branches are within normal limits. Left MCA M1 segment and bifurcation appear patent without stenosis. No left MCA branch occlusion identified. Contralateral right MCA M1 segment and bifurcation also appear patent and relatively symmetrically enhancing (series 6, image 25). No right MCA branch occlusion is identified. And bilateral MCA enhancing branch density appears fairly symmetric. Venous sinuses: Early contrast timing, the superior sagittal sinus and dominant left transverse and sigmoid venous sinuses appear patent along with the left IJ bulb. Anatomic variants: Diminutive vertebrobasilar system with dominant right and diminutive left vertebral arteries, the left arises directly from the arch and terminates in PICA. Other findings: Delayed postcontrast images of the brain are provided and demonstrate confluent patchy gyriform type enhancement affecting much of the abnormal right middle frontal gyrus (series 10, image 25 and series 16, image 37) in an area of about 4.5 cm. This abnormal enhancement continues inferiorly to the right frontal operculum (series 10, image 19). And there is similar abnormal patchy in gyriform enhancement at the posterior right temporal lobe, junction with the inferior right parietal lobe. Study discussed by telephone with Dr. Aisha Seals on 03/28/2024 at 06:22 . Review of the MIP images confirms the above findings IMPRESSION: 1. Constellation of acute thrombus in BOTH common carotid arteries (large volume on the Right), But no intracranial LVO. And multifocal abnormal enhancement in the right cerebral hemisphere  - which is not entirely specific - but suggestive of enhancing subacute infarcts. Underlying chronic posterior right MCA territory infarct. And noncontrast Head CT suggesting petechial hemorrhage in the presume subacute right middle frontal gyrus area. 2. Differential diagnosis discussion by telephone with Dr. Aisha Seals including septic emboli (such as Endocarditis related), hypercoagulable state from malignancy, versus congenital or other benign acquired coagulopathy. Also, he advises history of DVT, so large paradoxical emboli such as from cardiac septal defect are a consideration. 3. Negative CTP. Negative visible nonvascular spaces of the neck and upper chest. Electronically Signed   By: VEAR Hurst M.D.   On: 03/28/2024 06:27   CT HEAD CODE STROKE WO CONTRAST Result Date: 03/28/2024 CLINICAL DATA:  Code stroke.  48 year old female.  Possible seizure. EXAM: CT HEAD WITHOUT CONTRAST TECHNIQUE: Contiguous axial images were obtained from the base of the skull through the vertex without intravenous contrast. RADIATION DOSE REDUCTION: This exam was performed according to the departmental dose-optimization program which includes automated exposure control, adjustment of the mA and/or kV according to patient size and/or use of iterative reconstruction technique. COMPARISON:  None Available. FINDINGS: Brain: Abnormal right frontal lobe, MCA territory. Posterior right MCA chronic appearing cortical encephalomalacia (series 2, image 22). Anterior to that  abnormal mixed density obscuration of the normal right middle frontal gyrus architecture in an area of about 4 cm, series 2, image 20 and coronal image 42. Gray and white matter affected, although vasogenic edema there is not excluded. No midline shift, ventriculomegaly, intracranial mass effect. Basilar cisterns are patent. Outside of those areas gray-white matter differentiation appears normal. Vascular: No suspicious intracranial vascular hyperdensity. Skull:  Intact.  No acute osseous abnormality identified. Sinuses/Orbits: Visualized paranasal sinuses and mastoids are clear. Other: No gaze deviation. Visualized orbits and scalp soft tissues are within normal limits. ASPECTS [facility] Stroke Program Early CT Score) Total score (0-10 with 10 being normal): Unclear whether tumor versus heterogeneous infarct right middle frontal gyrus. IMPRESSION: Abnormal Right frontal lobe, with a combination of chronic posterior right MCA territory infarct (chronic encephalomalacia), but superimposed more anterior middle frontal gyrus 4 cm area of abnormal mixed density. And top differential considerations are recurrent right MCA infarct with hyperdense petechial hemorrhage, versus tumor with vasogenic edema. CTA is pending and delayed postcontrast images have been requested on that exam. Otherwise brain MRI without and with contrast would best characterize further. Study discussed by telephone with Dr. Dr. Aisha Seals on 03/28/2024 at 05:57 . Electronically Signed   By: VEAR Hurst M.D.   On: 03/28/2024 06:00     PHYSICAL EXAM  Temp:  [98 F (36.7 C)-98.5 F (36.9 C)] 98.1 F (36.7 C) (08/04 1600) Pulse Rate:  [103-131] 103 (08/04 1800) Resp:  [13-31] 24 (08/04 1800) BP: (126-147)/(78-119) 138/88 (08/04 1800) SpO2:  [96 %-100 %] 99 % (08/04 1800) Weight:  [62.1 kg] 62.1 kg (08/04 0515)  General - Well nourished, well developed, in no apparent distress.  Ophthalmologic - fundi not visualized due to noncooperation.  Cardiovascular - Regular rhythm and rate.  Mental Status -  Level of arousal and orientation to time, place, and person were intact. Language including expression, naming, repetition, comprehension was assessed and found intact. Fund of Knowledge was assessed and was intact.  Cranial Nerves II - XII - II - Visual field intact OU. III, IV, VI - Extraocular movements intact. V - Facial sensation decreased on the left VII - Facial movement  decreased on the left VIII - Hearing & vestibular intact bilaterally. X - Palate elevates symmetrically. XI - Chin turning & shoulder shrug intact bilaterally. XII - Tongue protrusion intact.  Motor Strength - The patient's strength was normal in RUE and RLE, however, left upper and lower extremity 4/5.  Bulk was normal and fasciculations were absent.   Motor Tone - Muscle tone was assessed at the neck and appendages and was normal.  Reflexes - The patient's reflexes were symmetrical in all extremities and she had no pathological reflexes.  Sensory - Light touch, temperature/pinprick were assessed and were decreased on the left.    Coordination - The patient had normal movements in the hands with no ataxia or dysmetria, however slow on the left.  Tremor was absent.  Gait and Station - deferred.   ASSESSMENT/PLAN Terri [name] is a 48 y.o. female with history of uncontrolled diabetes, depression, migraine, DVT in 2012 was on Coumadin  admitted for left-sided weakness.  Had witnessed seizure in CT.  No TNK given due to symptom resolved.    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  Multifocal abnormal enhancement in the  right cerebral hemisphere, suggestive of enhancing subacute infarcts with epidural hemorrhage CT head repeat stable MRI with and without contrast pending 2D Echo EF 70 to 75% Will need TEE LDL pending HgbA1c pending UDS positive for THC Autoimmune lab pending Hypercoagulable workup pending Heparin  IV for VTE prophylaxis No antithrombotic prior to admission, now on heparin  IV.  Ongoing aggressive stroke risk factor management Therapy recommendations: Pending Disposition: Pending  Seizure Witnessed seizure in CT On Keppra  1 g twice daily EEG normal with  excessive beta activity likely due to benzo Seizure precautions  History of DVT ?  Hypercoagulable state History of left lower extremity DVT in 2012, thought due to smoking with OCP use at that time.  Was on Coumadin  at that time, off after several months. 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  No malignancy seen  Uncontrolled diabetes Hyperglycemia HgbA1c pending goal < 7.0 Uncontrolled Currently on insulin  drip CBG monitoring DM education and close PCP follow up  Hypertension Stable Long term BP goal normotensive  Hyperlipidemia Home meds: None LDL pending, goal < 70 Consider statin if needed  Tobacco abuse Current occasional smoker Smoking cessation counseling provided Pt is willing to quit  Other Stroke Risk Factors Migraines Obstructive sleep apnea, on CPAP at home  Other Active Problems Leukocytosis, WBC 14.0 Depression  Hospital day # 0  This patient is critically ill due to stroke with bilateral CCA thrombus, seizure and at significant risk of neurological worsening, death form recurrent stroke, hemorrhagic conversion, status epilepticus. This patient's care requires constant monitoring of vital signs, hemodynamics, respiratory and cardiac monitoring, review of multiple databases, neurological assessment, discussion with family, other specialists and medical decision making of high complexity. I spent 30 minutes of neurocritical care time in the care of this patient.  Ary Cummins, MD PhD Stroke Neurology 03/28/2024 6:28 PM    To contact Stroke Continuity provider, please refer to [email or web address]. After hours, contact General Neurology

## 2024-03-28 NOTE — ED Notes (Signed)
CARELINK CALLED TO ACTIVATE STROKE

## 2024-03-29 ENCOUNTER — Other Ambulatory Visit (HOSPITAL_COMMUNITY): Payer: Self-pay

## 2024-03-29 DIAGNOSIS — E785 Hyperlipidemia, unspecified: Secondary | ICD-10-CM

## 2024-03-29 LAB — LACTIC ACID, PLASMA: Lactic Acid, Venous: 1.1 mmol/L (ref 0.5–1.9)

## 2024-03-29 LAB — MAGNESIUM: Magnesium: 1.7 mg/dL (ref 1.7–2.4)

## 2024-03-29 LAB — LIPID PANEL
Cholesterol: 233 mg/dL — ABNORMAL HIGH (ref 0–200)
HDL: 38 mg/dL — ABNORMAL LOW (ref >40)
LDL Cholesterol: UNDETERMINED mg/dL (ref 0–99)
Total CHOL/HDL Ratio: 6.1 ratio
Triglycerides: 425 mg/dL — ABNORMAL HIGH (ref <150)
VLDL: UNDETERMINED mg/dL (ref 0–40)

## 2024-03-29 LAB — ANTI-SCLERODERMA ANTIBODY: Scleroderma (Scl-70) (ENA) Antibody, IgG: <0.2 AI (ref 0.0–0.9)

## 2024-03-29 LAB — BASIC METABOLIC PANEL WITH GFR
Anion gap: 8 (ref 5–15)
BUN: <5 mg/dL — ABNORMAL LOW (ref 6–20)
CO2: 24 mmol/L (ref 22–32)
Calcium: 8.8 mg/dL — ABNORMAL LOW (ref 8.9–10.3)
Chloride: 104 mmol/L (ref 98–111)
Creatinine, Ser: 0.49 mg/dL (ref 0.44–1.00)
GFR, Estimated: >60 mL/min (ref >60)
Glucose, Bld: 238 mg/dL — ABNORMAL HIGH (ref 70–99)
Potassium: 3.2 mmol/L — ABNORMAL LOW (ref 3.5–5.1)
Sodium: 136 mmol/L (ref 135–145)

## 2024-03-29 LAB — GLUCOSE, CAPILLARY
Glucose-Capillary: 123 mg/dL — ABNORMAL HIGH (ref 70–99)
Glucose-Capillary: 148 mg/dL — ABNORMAL HIGH (ref 70–99)
Glucose-Capillary: 274 mg/dL — ABNORMAL HIGH (ref 70–99)
Glucose-Capillary: 220 mg/dL — ABNORMAL HIGH (ref 70–99)
Glucose-Capillary: 224 mg/dL — ABNORMAL HIGH (ref 70–99)
Glucose-Capillary: 264 mg/dL — ABNORMAL HIGH (ref 70–99)

## 2024-03-29 LAB — CYCLIC CITRUL PEPTIDE ANTIBODY, IGG/IGA: CCP Antibodies IgG/IgA: 5 U (ref 0–19)

## 2024-03-29 LAB — CBC
HCT: 38 % (ref 36.0–46.0)
Hemoglobin: 13 g/dL (ref 12.0–15.0)
MCH: 29.4 pg (ref 26.0–34.0)
MCHC: 34.2 g/dL (ref 30.0–36.0)
MCV: 86 fL (ref 80.0–100.0)
Platelets: 335 K/uL (ref 150–400)
RBC: 4.42 MIL/uL (ref 3.87–5.11)
RDW: 12.9 % (ref 11.5–15.5)
WBC: 10.4 K/uL (ref 4.0–10.5)
nRBC: 0 % (ref 0.0–0.2)

## 2024-03-29 LAB — HEPARIN LEVEL (UNFRACTIONATED)
Heparin Unfractionated: 0.19 [IU]/mL — ABNORMAL LOW (ref 0.30–0.70)
Heparin Unfractionated: 0.14 [IU]/mL — ABNORMAL LOW (ref 0.30–0.70)

## 2024-03-29 LAB — ANTI-DNA ANTIBODY, DOUBLE-STRANDED: ds DNA Ab: <1 [IU]/mL (ref 0–9)

## 2024-03-29 LAB — HEMOGLOBIN A1C
Hgb A1c MFr Bld: 13.7 % — ABNORMAL HIGH (ref 4.8–5.6)
Mean Plasma Glucose: 346 mg/dL

## 2024-03-29 LAB — PROCALCITONIN: Procalcitonin: <0.1 ng/mL

## 2024-03-29 LAB — ANTIEXTRACTABLE NUCLEAR AG
ENA SM Ab Ser-aCnc: <0.2 AI (ref 0.0–0.9)
Ribonucleic Protein: 0.2 AI (ref 0.0–0.9)

## 2024-03-29 LAB — PHOSPHORUS: Phosphorus: 4.4 mg/dL (ref 2.5–4.6)

## 2024-03-29 LAB — ANTITHROMBIN III: AntiThromb III Func: 108 % (ref 75–120)

## 2024-03-29 LAB — TROPONIN I (HIGH SENSITIVITY): Troponin I (High Sensitivity): 9 ng/L (ref <18)

## 2024-03-29 LAB — LDL CHOLESTEROL, DIRECT: Direct LDL: 132 mg/dL — ABNORMAL HIGH (ref 0–99)

## 2024-03-29 MED ORDER — MAGNESIUM SULFATE 2 GM/50ML IV SOLN
2.0000 g | Freq: Once | INTRAVENOUS | Status: AC
  Administered 2024-03-29: 2 g via INTRAVENOUS
  Filled 2024-03-29: qty 50

## 2024-03-29 MED ORDER — INSULIN ASPART 100 UNIT/ML IJ SOLN
0.0000 [IU] | INTRAMUSCULAR | Status: DC
  Administered 2024-03-29: 11 [IU] via SUBCUTANEOUS
  Administered 2024-03-29: 7 [IU] via SUBCUTANEOUS
  Administered 2024-03-30: 4 [IU] via SUBCUTANEOUS
  Administered 2024-03-30 (×3): 7 [IU] via SUBCUTANEOUS
  Administered 2024-03-30: 4 [IU] via SUBCUTANEOUS
  Administered 2024-03-31: 3 [IU] via SUBCUTANEOUS
  Administered 2024-03-31: 4 [IU] via SUBCUTANEOUS
  Administered 2024-03-31: 3 [IU] via SUBCUTANEOUS

## 2024-03-29 MED ORDER — SODIUM CHLORIDE 0.9 % IV SOLN: INTRAVENOUS | Status: DC

## 2024-03-29 MED ORDER — POTASSIUM CHLORIDE CRYS ER 20 MEQ PO TBCR: 60.0000 meq | EXTENDED_RELEASE_TABLET | Freq: Once | ORAL | Status: DC

## 2024-03-29 MED ORDER — ATORVASTATIN CALCIUM 80 MG PO TABS
80.0000 mg | ORAL_TABLET | Freq: Every day | ORAL | Status: DC
  Administered 2024-03-29 – 2024-03-31 (×3): 80 mg via ORAL
  Filled 2024-03-29 (×3): qty 1

## 2024-03-29 MED ORDER — INSULIN ASPART 100 UNIT/ML IJ SOLN
0.0000 [IU] | INTRAMUSCULAR | Status: DC
  Administered 2024-03-29: 5 [IU] via SUBCUTANEOUS
  Administered 2024-03-29: 8 [IU] via SUBCUTANEOUS

## 2024-03-29 MED ORDER — POTASSIUM CHLORIDE CRYS ER 20 MEQ PO TBCR
40.0000 meq | EXTENDED_RELEASE_TABLET | ORAL | Status: AC
  Administered 2024-03-29 (×2): 40 meq via ORAL
  Filled 2024-03-29 (×2): qty 2

## 2024-03-29 MED ORDER — ACETAMINOPHEN 325 MG PO TABS
650.0000 mg | ORAL_TABLET | Freq: Four times a day (QID) | ORAL | Status: DC | PRN
  Administered 2024-03-29 – 2024-03-31 (×5): 650 mg via ORAL
  Filled 2024-03-29 (×5): qty 2

## 2024-03-29 MED ORDER — LEVETIRACETAM 500 MG PO TABS
1000.0000 mg | ORAL_TABLET | Freq: Two times a day (BID) | ORAL | Status: DC
  Administered 2024-03-29 – 2024-03-31 (×4): 1000 mg via ORAL
  Filled 2024-03-29 (×4): qty 2

## 2024-03-29 NOTE — Progress Notes (Signed)

## 2024-03-29 NOTE — Evaluation (Signed)
 Occupational Therapy Evaluation Patient Details Name: Terri [name] MRN: [id number] DOB: [birth year] Today's Date: 03/29/2024   History of Present Illness   48 year old female presented to [facility] ED 8/4 with complaints of L sided weakness and AMS. 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 PMH: DVT, Bipolar, Schizoaffective disorder, and migraines     Clinical Impressions Pt ind with ADL/functional mobility PTA, pt lives with her mother who can assist. Pt currently needing up to mod A for ADLs, min A for bed mobility and min A for transfers with 1 person HHA, pt with instability and LOB to L side with static standing. Pt presenting with impairments listed below, will follow acutely. Patient will benefit from intensive inpatient follow-up therapy, >3 hours/day to maximize safety/ind with ADL/functional mobility.      If plan is discharge home, recommend the following:   A little help with walking and/or transfers;A lot of help with bathing/dressing/bathroom;Direct supervision/assist for medications management;Direct supervision/assist for financial management;Assist for transportation;Help with stairs or ramp for entrance;Assistance with cooking/housework     Functional Status Assessment   Patient has had a recent decline in their functional status and demonstrates the ability to make significant improvements in function in a reasonable and predictable amount of time.     Equipment Recommendations   None recommended by OT     Recommendations for Other Services   PT consult;Rehab consult     Precautions/Restrictions   Precautions Precautions: Fall Restrictions Weight Bearing Restrictions Per Provider Order: No     Mobility Bed Mobility Overal bed mobility: Needs Assistance Bed Mobility: Supine to Sit     Supine to sit: Min assist          Transfers Overall  transfer level: Needs assistance Equipment used: 1 person hand held assist Transfers: Sit to/from Stand Sit to Stand: Min assist                  Balance Overall balance assessment: Needs assistance Sitting-balance support: Feet supported, No upper extremity supported Sitting balance-Leahy Scale: Fair     Standing balance support: Bilateral upper extremity supported, During functional activity, Reliant on assistive device for balance Standing balance-Leahy Scale: Fair Standing balance comment: static standing without AD                           ADL either performed or assessed with clinical judgement   ADL Overall ADL's : Needs assistance/impaired Eating/Feeding: Set up   Grooming: Contact guard assist;Standing;Oral care   Upper Body Bathing: Minimal assistance;Sitting;Standing   Lower Body Bathing: Moderate assistance;Sit to/from stand;Sitting/lateral leans   Upper Body Dressing : Minimal assistance;Sitting;Standing   Lower Body Dressing: Moderate assistance;Sitting/lateral leans;Sit to/from stand   Toilet Transfer: Minimal assistance;Ambulation           Functional mobility during ADLs: Minimal assistance       Vision   Vision Assessment?: Vision impaired- to be further tested in functional context;Wears glasses for reading Additional Comments: difficulty sustaining gaze to L inferior visual field     Perception Perception: Not tested       Praxis Praxis: Not tested       Pertinent Vitals/Pain Pain Assessment Pain Assessment: No/denies pain     Extremity/Trunk Assessment Upper Extremity Assessment Upper Extremity Assessment: LUE deficits/detail LUE Deficits / Details: 3+/5 compared to RUE, decr coordinaton   Lower Extremity Assessment  Lower Extremity Assessment: Defer to PT evaluation LLE Deficits / Details: grossly 3-/5, impaired coordination   Cervical / Trunk Assessment Cervical / Trunk Assessment: Normal   Communication  Communication Communication: No apparent difficulties   Cognition Arousal: Alert Behavior During Therapy: WFL for tasks assessed/performed Cognition: No apparent impairments                               Following commands: Intact       Cueing  General Comments   Cueing Techniques: Verbal cues  VSS on RA   Exercises     Shoulder Instructions      Home Living Family/patient expects to be discharged to:: Private residence Living Arrangements: Parent (mom) Available Help at Discharge: Family;Available 24 hours/day Type of Home: House Home Access: Stairs to enter [employer] of Steps: 3 Entrance Stairs-Rails: None Home Layout: One level     Bathroom Shower/Tub: Chief Strategy Officer: Standard     Home Equipment: TEFL teacher Comments: unsure of accuracy of home situation as patient states that sometimes she lives with her mom but yet her mom can stay with her all the time if needed      Prior Functioning/Environment Prior Level of Function : Independent/Modified Independent             Mobility Comments: no AD, doesn't drive ADLs Comments: indep, uses door dash for groceries    OT Problem List: Decreased strength;Decreased range of motion;Decreased activity tolerance;Impaired balance (sitting and/or standing);Decreased cognition;Decreased safety awareness   OT Treatment/Interventions: Self-care/ADL training;Therapeutic exercise;DME and/or AE instruction;Energy conservation;Therapeutic activities;Balance training;Patient/family education;Cognitive remediation/compensation;Visual/perceptual remediation/compensation      OT Goals(Current goals can be found in the care plan section)   Acute Rehab OT Goals Patient Stated Goal: none stated OT Goal Formulation: With patient Time For Goal Achievement: 04/12/24 Potential to Achieve Goals: Good ADL Goals Pt Will Perform Upper Body Dressing: with  supervision;sitting Pt Will Perform Lower Body Dressing: with supervision;sit to/from stand;sitting/lateral leans Pt Will Transfer to Toilet: with supervision;ambulating;regular height toilet Additional ADL Goal #1: Pt will perform higher level cognitive task with min error in prep for ADL/IADL   OT Frequency:  Min 2X/week    Co-evaluation              AM-PAC OT 6 Clicks Daily Activity     Outcome Measure Help from another person eating meals?: A Little Help from another person taking care of personal grooming?: A Little Help from another person toileting, which includes using toliet, bedpan, or urinal?: A Little Help from another person bathing (including washing, rinsing, drying)?: A Lot Help from another person to put on and taking off regular upper body clothing?: A Little Help from another person to put on and taking off regular lower body clothing?: A Lot 6 Click Score: 16   End of Session Equipment Utilized During Treatment: Gait belt Nurse Communication: Mobility status  Activity Tolerance: Patient tolerated treatment well Patient left: in chair;with call bell/phone within reach;with chair alarm set;with family/visitor present  OT Visit Diagnosis: Unsteadiness on feet (R26.81);Other abnormalities of gait and mobility (R26.89);Muscle weakness (generalized) (M62.81)                Time: 8754-8685 OT Time Calculation (min): 29 min Charges:  OT General Charges $OT Visit: 1 Visit OT Evaluation $OT Eval Moderate Complexity: 1 Mod OT Treatments $Self Care/Home Management : 8-22 mins  Hien Cunliffe K,  OTD, OTR/L SecureChat Preferred Acute Rehab [phone]   Chayce Robbins K Koonce 03/29/2024, 1:28 PM

## 2024-03-29 NOTE — Progress Notes (Signed)
 PHARMACY - ANTICOAGULATION CONSULT NOTE  Pharmacy Consult for heparin  Indication: stroke  Allergies  Allergen Reactions   Dye Fdc Blue [Brilliant Blue Fcf (Fd&C Blue #1)] Hives    MRI   Other Nausea And Vomiting    Zole    Patient Measurements: Height: 5' 2 (157.5 cm) Weight: 62.1 kg (137 lb) IBW/kg (Calculated) : 50.1 HEPARIN  DW (KG): 62.1  Vital Signs: Temp: 97.9 F (36.6 C) (08/05 0700) Temp Source: Oral (08/05 0700) BP: 130/90 (08/05 0800) Pulse Rate: 103 (08/05 0800)  Labs: Recent Labs    03/28/24 0524 03/28/24 0538 03/28/24 0538 03/28/24 0546 03/28/24 0548 03/28/24 1433 03/28/24 2153 03/29/24 0718  HGB  --  13.7   < > 16.0* 15.3*  --   --  13.0  HCT  --  40.9   < > 47.0* 45.0  --   --  38.0  PLT  --  341  --   --   --   --   --  335  APTT  --  29  --   --   --   --   --   --   LABPROT  --  13.0  --   --   --   --   --   --   INR  --  0.9  --   --   --   --   --   --   HEPARINUNFRC  --   --   --   --   --  <0.10* <0.10* 0.19*  CREATININE 0.95  --   --  0.80  --   --   --   --    < > = values in this interval not displayed.    Estimated Creatinine Clearance: 77 mL/min (by C-G formula based on SCr of 0.8 mg/dL).   Assessment: 48yo female presents to ED for dizziness and weakness starting ~1wk ago, called as code stroke; CT and CTA reveal acute thrombus in both common carotid arteries and possible subacute infarcts as well as petechial hemorrhage >> to start heparin  infusion given risk<benefit given size and location of intraluminal thrombi.  Heparin  level < 0.19 on heparin  950 units/hr. No issues with heparin  infusion or bleeding.   Goal of Therapy:  Heparin  level 0.3-0.5 units/ml Monitor platelets by anticoagulation protocol: Yes   Plan:  Increase heparin  infusion to 1050 units/hr. Recheck heparin  level in 6hrs  Larraine Brazier, PharmD Clinical Pharmacist

## 2024-03-29 NOTE — Progress Notes (Signed)
 Pt transferred from 4N ICU S/P CVA, Pt oriented, c/o of slight abdominal pain with a rating of 2, settled in bed with call light at bedside, tele monitor put and verified on pt, safety concern explained and initiated as well, pt reassured and will continue to monitor, v/s stable. Terri [name], [place] Efe

## 2024-03-29 NOTE — Evaluation (Signed)
 Physical Therapy Evaluation Patient Details Name: Terri [name] MRN: [id number] DOB: 08/17/1976 Today's Date: 03/29/2024  History of Present Illness  48 year old female presented to [facility] ED 8/4 with complaints of L sided weakness and AMS. 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 PMH: DVT, Bipolar, Schizoaffective disorder, and migraines   Clinical Impression  Pt admitted with above. Pt pleasant and aware of situation and deficits. Pt eager to return to indep. Unsure of accuracy of patient report of living situation however pt states she lives with her mother sometimes however if she were to need help she reports her mom could be with her. PTA pt was indep without AD but didn't drive. Pt presenting with impaired balance, L hemiparesis, increased falls risk, and requires assist for transfers, ADLs, and ambulation.  Pt to strongly benefit from aggressive inpatient rehab program >3 hrs a day to progress towards safe mod I level of function for safe return home with mother. Acute PT to cont to follow.      If plan is discharge home, recommend the following: A little help with walking and/or transfers;A little help with bathing/dressing/bathroom;Assist for transportation;Help with stairs or ramp for entrance   Can travel by private vehicle        Equipment Recommendations  (TBD)  Recommendations for Other Services  Rehab consult    Functional Status Assessment Patient has had a recent decline in their functional status and demonstrates the ability to make significant improvements in function in a reasonable and predictable amount of time.     Precautions / Restrictions Precautions Precautions: Fall      Mobility  Bed Mobility Overal bed mobility: Needs Assistance Bed Mobility: Supine to Sit     Supine to sit: Min assist     General bed mobility comments: increased time, minA for  trunk, pt guarded, assist for line management    Transfers Overall transfer level: Needs assistance Equipment used: 1 person hand held assist Transfers: Sit to/from Stand Sit to Stand: Min assist           General transfer comment: minA to power up and steady, pt guarded and cautious reporting i'm definately unsteady, wide base of support    Ambulation/Gait Ambulation/Gait assistance: Min assist, Mod assist Gait Distance (Feet): 100 Feet Assistive device: IV Pole Gait Pattern/deviations: Step-to pattern, Decreased stride length, Decreased dorsiflexion - left, Decreased weight shift to left, Knee flexed in stance - left Gait velocity: dec Gait velocity interpretation: <1.8 ft/sec, indicate of risk for recurrent falls   General Gait Details: pt with noted L knee instability, decreased step length, difficulty keeping L hand on IV pole due to weak grip. Pt with L sided antalgia, pt with increased L knee flexion during ambulation with onset of fatigue  Stairs            Wheelchair Mobility     Tilt Bed    Modified Rankin (Stroke Patients Only)       Balance Overall balance assessment: Needs assistance Sitting-balance support: Feet supported, No upper extremity supported Sitting balance-Leahy Scale: Fair     Standing balance support: Bilateral upper extremity supported, During functional activity, Reliant on assistive device for balance Standing balance-Leahy Scale: Poor Standing balance comment: reliant on external support  Pertinent Vitals/Pain Pain Assessment Pain Assessment: No/denies pain    Home Living Family/patient expects to be discharged to:: Private residence Living Arrangements: Parent Available Help at Discharge: Family;Available 24 hours/day Type of Home: House Home Access: Stairs to enter Entrance Stairs-Rails: None Entrance Stairs-Number of Steps: 3   Home Layout: One level Home Equipment: Cane -  single point Additional Comments: unsure of accuracy of home situation as patient states that sometimes she lives with her mom but yet her mom can stay with her all the time if needed    Prior Function Prior Level of Function : Independent/Modified Independent             Mobility Comments: no AD, doesn't drive ADLs Comments: indep, uses door dash for groceries     Extremity/Trunk Assessment   Upper Extremity Assessment Upper Extremity Assessment: LUE deficits/detail LUE Deficits / Details: decreased strength, impaired co-ordination compared to R in observation during tasks    Lower Extremity Assessment Lower Extremity Assessment: LLE deficits/detail LLE Deficits / Details: grossly 3-/5, impaired coordination    Cervical / Trunk Assessment Cervical / Trunk Assessment: Normal  Communication   Communication Communication: No apparent difficulties    Cognition Arousal: Alert Behavior During Therapy: WFL for tasks assessed/performed   PT - Cognitive impairments: No apparent impairments                       PT - Cognition Comments: aware of psych history however pt appropriate, aware of situation and deficits, and is able to follow commands. Following commands: Intact       Cueing Cueing Techniques: Verbal cues     General Comments General comments (skin integrity, edema, etc.): VSS    Exercises     Assessment/Plan    PT Assessment Patient needs continued PT services  PT Problem List Decreased strength;Decreased activity tolerance;Decreased balance;Decreased mobility;Decreased coordination       PT Treatment Interventions DME instruction;Gait training;Functional mobility training;Therapeutic activities;Therapeutic exercise;Stair training;Cognitive remediation;Balance training;Neuromuscular re-education    PT Goals (Current goals can be found in the Care Plan section)  Acute Rehab PT Goals Patient Stated Goal: indep PT Goal Formulation: With  patient Time For Goal Achievement: 04/12/24 Potential to Achieve Goals: Good Additional Goals Additional Goal #1: Pt to score >19 on DGI to indicate minimal falls risk.    Frequency Min 4X/week     Co-evaluation               AM-PAC PT 6 Clicks Mobility  Outcome Measure Help needed turning from your back to your side while in a flat bed without using bedrails?: A Little Help needed moving from lying on your back to sitting on the side of a flat bed without using bedrails?: A Little Help needed moving to and from a bed to a chair (including a wheelchair)?: A Little Help needed standing up from a chair using your arms (e.g., wheelchair or bedside chair)?: A Little Help needed to walk in hospital room?: A Lot Help needed climbing 3-5 steps with a railing? : A Lot 6 Click Score: 16    End of Session Equipment Utilized During Treatment: Gait belt Activity Tolerance: Patient tolerated treatment well Patient left: in chair;with call bell/phone within reach;with chair alarm set Nurse Communication: Mobility status PT Visit Diagnosis: Unsteadiness on feet (R26.81);Muscle weakness (generalized) (M62.81);Difficulty in walking, not elsewhere classified (R26.2);Hemiplegia and hemiparesis Hemiplegia - Right/Left: Left Hemiplegia - dominant/non-dominant: Non-dominant    Time: 9155-9088 PT Time Calculation (min) (ACUTE  ONLY): 27 min   Charges:   PT Evaluation $PT Eval Moderate Complexity: 1 Mod PT Treatments $Gait Training: 8-22 mins PT General Charges $$ ACUTE PT VISIT: 1 Visit         Norene Ames, PT, DPT Acute Rehabilitation Services Secure chat preferred Office #: [phone]   Norene CHRISTELLA Ames 03/29/2024, 10:25 AM

## 2024-03-29 NOTE — Progress Notes (Signed)
 STROKE TEAM PROGRESS NOTE   SUBJECTIVE (INTERVAL HISTORY) Her mom is at the bedside.  Pt sitting in chair, no acute event overnight. No HA. On heparin  IV.  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    OBJECTIVE Temp:  [97.9 F (36.6 C)-98.7 F (37.1 C)] 97.9 F (36.6 C) (08/05 1200) Pulse Rate:  [89-111] 89 (08/05 1400) Cardiac Rhythm: Sinus tachycardia (08/05 1200) Resp:  [12-30] 20 (08/05 1400) BP: (118-142)/(77-93) 134/82 (08/05 1400) SpO2:  [95 %-100 %] 100 % (08/05 1400)  Recent Labs  Lab 03/28/24 2341 03/29/24 0103 03/29/24 0345 03/29/24 0747 03/29/24 1142  GLUCAP 174* 123* 148* 274* 220*   Recent Labs  Lab 03/28/24 0524 03/28/24 0546 03/28/24 0548 03/29/24 0718  NA 136 138 137 136  K 3.5 3.7 3.7 3.2*  CL 99 98  --  104  CO2 24  --   --  24  GLUCOSE 504* 500*  --  238*  BUN 6 7  --  <5*  CREATININE 0.95 0.80  --  0.49  CALCIUM  9.3  --   --  8.8*  MG  --   --   --  1.7  PHOS  --   --   --  4.4   Recent Labs  Lab 03/28/24 0524  AST 22  ALT 10  ALKPHOS 93  BILITOT 0.7  PROT 6.8  ALBUMIN 3.2*   Recent Labs  Lab 03/28/24 0538 03/28/24 0546 03/28/24 0548 03/29/24 0718  WBC 14.0*  --   --  10.4  NEUTROABS 9.6*  --   --   --   HGB 13.7 16.0* 15.3* 13.0  HCT 40.9 47.0* 45.0 38.0  MCV 88.1  --   --  86.0  PLT 341  --   --  335   No results for input(s): CKTOTAL, CKMB, CKMBINDEX, TROPONINI in the last 168 hours. Recent Labs    03/28/24 0538  LABPROT 13.0  INR 0.9   Recent Labs    03/28/24 0542  COLORURINE STRAW*  LABSPEC 1.031*  PHURINE 7.0  GLUCOSEU >=500*  HGBUR MODERATE*  BILIRUBINUR NEGATIVE  KETONESUR NEGATIVE  PROTEINUR 30*  NITRITE NEGATIVE  LEUKOCYTESUR NEGATIVE       Component Value Date/Time   CHOL 233 (H) 03/29/2024 0718   TRIG 425 (H) 03/29/2024 0718   HDL 38 (L) 03/29/2024 0718   CHOLHDL 6.1 03/29/2024 0718   VLDL UNABLE TO CALCULATE IF TRIGLYCERIDE OVER 400 mg/dL 91/94/7974 9281   LDLCALC UNABLE TO  CALCULATE IF TRIGLYCERIDE OVER 400 mg/dL 91/94/7974 9281   Lab Results  Component Value Date   HGBA1C 8.5 (H) 07/08/2019      Component Value Date/Time   LABOPIA NONE DETECTED 03/28/2024 0542   COCAINSCRNUR NONE DETECTED 03/28/2024 0542   LABBENZ NONE DETECTED 03/28/2024 0542   AMPHETMU NONE DETECTED 03/28/2024 0542   THCU POSITIVE (A) 03/28/2024 0542   LABBARB NONE DETECTED 03/28/2024 0542    Recent Labs  Lab 03/28/24 0524  ETH <15    I have personally reviewed the radiological images below and agree with the radiology interpretations.  ECHOCARDIOGRAM LIMITED BUBBLE STUDY Result Date: 03/28/2024    ECHOCARDIOGRAM LIMITED REPORT   Patient Name:   JOSS FRIEDEL Date of Exam: 03/28/2024 Medical Rec #:  [id number]           Height:       62.0 in Accession #:    [id number]          Weight:  137.0 lb Date of Birth:  [birth year]          BSA:          1.628 m Patient Age:    48 years            BP:           132/81 mmHg Patient Gender: F                   HR:           102 bpm. Exam Location:  Inpatient Procedure: Limited Echo (Both Spectral and Color Flow Doppler were utilized            during procedure). Indications:    patent foramen ovale  History:        Patient has prior history of Echocardiogram examinations, most                 recent 03/28/2024. Risk Factors:Current Smoker.  Sonographer:    Tinnie Barefoot RDCS Referring Phys: 68 MURALI RAMASWAMY  Conclusion(s)/Recommendation(s): Limited study to assess for intercardiac shunting. Limited visualization, but no large shunt seen. Consider TEE if further evaluation needed. FINDINGS  Left Ventricle: IAS/Shunts: Agitated saline contrast was given intravenously to evaluate for intracardiac shunting. Morene Brownie Electronically signed by Morene Brownie Signature Date/Time: 03/28/2024/4:45:10 PM    Final    CT HEAD WO CONTRAST ( ) Result Date: 03/28/2024 EXAM: CT HEAD WITHOUT 03/28/2024 12:50:40 PM TECHNIQUE: CT of the head was  performed without the administration of intravenous contrast. Automated exposure control, iterative reconstruction, and/or weight based adjustment of the mA/kV was utilized to reduce the radiation dose to as low as reasonably achievable. COMPARISON: CT angiogram of the head and neck dated 03/28/2024 and CT of the head dated 03/28/2024. CLINICAL HISTORY: Stroke, follow up. FINDINGS: BRAIN AND VENTRICLES: No acute intracranial hemorrhage. No mass effect or midline shift. No extra-axial fluid collection. No hydrocephalus. Encephalomalacia changes again demonstrated within the right parietal lobe. There is increased attenuation within the cortex of the right frontal lobe and obscuration of the cerebral sulci suggesting contrast staining from either regional cerebritis or ischemic tissue. ORBITS: No acute abnormality. SINUSES AND MASTOIDS: No acute abnormality. SOFT TISSUES AND SKULL: No acute skull fracture. No acute soft tissue abnormality. IMPRESSION: 1. Encephalomalacia changes in the right parietal lobe. 2. Increased attenuation in the right frontal lobe cortex and obscuration of the cerebral sulci, suggestive of contrast staining from either regional cerebritis or ischemic tissue. Electronically signed by: evalene coho 03/28/2024 01:09 PM EDT RP Workstation: [id number]   EEG adult Result Date: 03/28/2024 Gregg Lek, MD     03/28/2024 11:42 AM Patient Name: OREE MIRELEZ MRN: [id number] Epilepsy Attending: Lek Gregg Referring Physician/Provider: No ref. provider found     Date: 03/28/2024 Duration: 23 minutes Patient history:  48 y.o. 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 EEG to rule out subclinical seizures Level of alertness: Drowsy, sleep, lethargic AEDs during EEG study: Technical aspects: This EEG study was done with scalp electrodes positioned according to the 10-20  International system of electrode placement. Electrical activity was reviewed with band pass filter of 1-70Hz , sensitivity of 7 uV/mm, display speed of 23mm/sec with a 60Hz  notched filter applied as appropriate. EEG data were recorded continuously and digitally stored.  Video monitoring was available and reviewed as appropriate. Description: The posterior dominant rhythm consists of  12-13 Hz activity of moderate voltage (25-35 uV) seen predominantly in posterior head regions, symmetric and reactive to eye opening and eye closing. Drowsiness was characterized by attenuation of the posterior background rhythm. Sleep was characterized by vertex waves, sleep spindles (12 to 14 Hz), maximal frontocentral region.  There is an excessive amount of 15 to 18 Hz, 2-3 uV beta activity with irregular morphology distributed symmetrically and diffusely. Hyperventilation and photic stimulation were not performed.   ABNORMALITY - Excessive beta, generalized IMPRESSION: This study is within normal limits. No seizures or epileptiform discharges were seen throughout the recording. The excessive beta activity seen in the background is most likely due to the effect of benzodiazepine and is a benign EEG pattern. A normal interictal EEG does not exclude nor support the diagnosis of epilepsy. Pastor Falling MD Neurology    ECHOCARDIOGRAM COMPLETE Result Date: 03/28/2024    ECHOCARDIOGRAM REPORT   Patient Name:   ANJALINA BERGEVIN Date of Exam: 03/28/2024 Medical Rec #:  [id number]           Height:       62.0 in Accession #:    [id number]          Weight:       137.0 lb Date of Birth:  April 30, 1976          BSA:          1.628 m Patient Age:    48 years            BP:           141/119 mmHg Patient Gender: F                   HR:           120 bpm. Exam Location:  Inpatient Procedure: 2D Echo, Cardiac Doppler and Color Doppler (Both Spectral and Color            Flow Doppler were utilized during procedure). Indications:    Stroke  History:         Patient has no prior history of Echocardiogram examinations. No                 relevant cardiac history.  Sonographer:    Therisa Crouch Referring Phys: [id number] MADISON KOMMOR  Sonographer Comments: Technically difficult study due to poor echo windows. IMPRESSIONS  1. Mild intercavitary gradient noted (<28mmHg) due to hyperdynamic and underfilled LV. Left ventricular ejection fraction, by estimation, is 70 to 75%. The left ventricle has hyperdynamic function. The left ventricle has no regional wall motion abnormalities. Indeterminate diastolic filling due to E-A fusion.  2. Right ventricular systolic function is normal. The right ventricular size is normal.  3. The mitral valve is normal in structure. No evidence of mitral valve regurgitation. No evidence of mitral stenosis.  4. The aortic valve is tricuspid. Aortic valve regurgitation is not visualized. Aortic valve sclerosis is present, with no evidence of aortic valve stenosis.  5. The inferior vena cava is normal in size with greater than 50% respiratory variability, suggesting right atrial pressure of 3 mmHg. FINDINGS  Left Ventricle: Mild intercavitary gradient noted (<14mmHg) due to hyperdynamic and underfilled LV. Left ventricular ejection fraction, by estimation, is 70 to 75%. The left ventricle has hyperdynamic function. The left ventricle has no regional wall motion abnormalities. The left ventricular internal cavity size was normal in size. There is no left ventricular hypertrophy. Indeterminate diastolic filling due to E-A fusion. Right Ventricle: The right  ventricular size is normal. No increase in right ventricular wall thickness. Right ventricular systolic function is normal. Left Atrium: Left atrial size was normal in size. Right Atrium: Right atrial size was normal in size. Pericardium: There is no evidence of pericardial effusion. Mitral Valve: The mitral valve is normal in structure. No evidence of mitral valve regurgitation. No evidence of  mitral valve stenosis. Tricuspid Valve: The tricuspid valve is normal in structure. Tricuspid valve regurgitation is not demonstrated. No evidence of tricuspid stenosis. Aortic Valve: The aortic valve is tricuspid. Aortic valve regurgitation is not visualized. Aortic valve sclerosis is present, with no evidence of aortic valve stenosis. Aortic valve mean gradient measures 3.0 mmHg. Aortic valve peak gradient measures 6.1  mmHg. Aortic valve area, by VTI measures 1.84 cm. Pulmonic Valve: The pulmonic valve was normal in structure. Pulmonic valve regurgitation is not visualized. No evidence of pulmonic stenosis. Aorta: The aortic root is normal in size and structure. Venous: The inferior vena cava is normal in size with greater than 50% respiratory variability, suggesting right atrial pressure of 3 mmHg. IAS/Shunts: The interatrial septum was not well visualized.  LEFT VENTRICLE PLAX 2D LVIDd:         4.40 cm LVIDs:         3.10 cm LV PW:         0.90 cm LV IVS:        1.00 cm LVOT diam:     1.80 cm LV SV:         35 LV SV Index:   21 LVOT Area:     2.54 cm  IVC IVC diam: 0.90 cm LEFT ATRIUM             Index LA diam:        2.70 cm 1.66 cm/m LA Vol (A2C):   21.5 ml 13.21 ml/m LA Vol (A4C):   27.7 ml 17.02 ml/m LA Biplane Vol: 26.2 ml 16.10 ml/m  AORTIC VALVE AV Area (Vmax):    1.81 cm AV Area (Vmean):   1.88 cm AV Area (VTI):     1.84 cm AV Vmax:           123.00 cm/s AV Vmean:          86.900 cm/s AV VTI:            0.189 m AV Peak Grad:      6.1 mmHg AV Mean Grad:      3.0 mmHg LVOT Vmax:         87.50 cm/s LVOT Vmean:        64.200 cm/s LVOT VTI:          0.137 m LVOT/AV VTI ratio: 0.72  AORTA Ao Root diam: 3.00 cm Ao Asc diam:  2.60 cm  SHUNTS Systemic VTI:  0.14 m Systemic Diam: 1.80 cm Morene Brownie Electronically signed by Morene Brownie Signature Date/Time: 03/28/2024/8:49:23 AM    Final    CT VENOGRAM ABD/PELVIS/LOWER EXT BILAT Addendum Date: 03/28/2024 ADDENDUM #1 ADDENDUM: The above findings  were discussed with Dr. Simon at 07:41 am 03/28/2024. ---------------------------------------------------- Electronically signed by: timothy berrigan 03/28/2024 07:45 AM EDT RP Workstation: [id number]   Result Date: 03/28/2024 ORIGINAL REPORT  EXAM: CTA ABDOMEN AND PELVIS WITH CONTRAST AND RUNOFF CTA OF THE LOWER EXTREMITIES WITH CONTRAST 03/28/2024 07:09:15 AM TECHNIQUE: CTA images of the abdomen, pelvis and lower extremities with intravenous contrast. Three-dimensional MIP/volume rendered formations were performed. Automated exposure control, iterative reconstruction, and/or weight based adjustment of the  mA/kV was utilized to reduce the radiation dose to as low as reasonably achievable. COMPARISON: None available. CLINICAL HISTORY: Deep venous thrombosis (DVT). FINDINGS: VASCULATURE: AORTA: No acute finding. No abdominal aortic aneurysm. No dissection. There is moderate calcific atheromatous disease within the aortoiliac arteries. CELIAC TRUNK: No acute finding. No occlusion or significant stenosis. SUPERIOR MESENTERIC ARTERY: No acute finding. No occlusion or significant stenosis. The mesenteric arteries are patent. INFERIOR MESENTERIC ARTERY: No acute finding. No occlusion or significant stenosis. RENAL ARTERIES: No acute finding. No occlusion or significant stenosis. The renal arteries are patent. RIGHT ILIAC ARTERIES: No acute finding. No occlusion or significant stenosis. RIGHT FEMORAL ARTERIES: No acute finding. No occlusion or significant stenosis. RIGHT POPLITEAL ARTERY: No acute finding. No occlusion or significant stenosis. RIGHT CALF ARTERIES: No acute finding. No occlusion or significant stenosis. LEFT ILIAC ARTERIES: No acute finding. No occlusion or significant stenosis. LEFT FEMORAL ARTERIES: The left superficial femoral artery is completely occluded. LEFT POPLITEAL ARTERY: No acute finding. No occlusion or significant stenosis. LEFT CALF ARTERIES: No acute finding. No occlusion or significant  stenosis. ABDOMEN AND PELVIS: LOWER CHEST: There are ground-glass and streaky opacities present within the right lower lobe and ground-glass opacities present dependently within the left lower lobe. LIVER: The liver is unremarkable. GALLBLADDER AND BILE DUCTS: Gallbladder is unremarkable. No biliary ductal dilatation. SPLEEN: The spleen is unremarkable. PANCREAS: The pancreas is unremarkable. ADRENAL GLANDS: Bilateral adrenal glands demonstrate no acute abnormality. KIDNEYS, URETERS AND BLADDER: No stones in the kidneys or ureters. No hydronephrosis. No evidence of perinephric or periureteral stranding. Urinary bladder is unremarkable. There is left renal cortical atrophy and scarring. GI AND Bowel: Stomach and duodenal sweep demonstrate no acute abnormality. There is no bowel obstruction. No abnormal bowel wall thickening or distension. The patient is status post partial colectomy. Anastomosis sutures are noted in the right pelvis. REPRODUCTIVE: Reproductive organs are unremarkable. PERITONEUM AND RETRPERITONEUM: No ascites or free air. A periumbilical fat-containing hernia is present. LYMPH NODES: No evidence of lymphadenopathy. BONES AND SOFT TISSUES: No acute abnormality of the bones. No acute soft tissue abnormality. VASCULATURE: The inferior vena cava and iliac veins are patent as are the femoral veins. IMPRESSION: 1. Left superficial femoral artery occlusion. 2. 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 3. Ground-glass and streaky opacities in the right lower lobe and ground-glass opacities in the left lower lobe. 4. Left renal cortical atrophy and scarring. 5. Periumbilical fat-containing hernia. 6. Status post partial colectomy with anastomosis sutures in the right pelvis. Electronically signed by: evalene coho 03/28/2024 07:33 AM EDT RP Workstation: [id number]   DG Chest Portable 1 View Result Date: 03/28/2024 EXAM: 1 VIEW XRAY OF THE CHEST 03/28/2024 07:22:00 AM  COMPARISON: 07/06/2009 CLINICAL HISTORY: Concern for PNA. FINDINGS: LUNGS AND PLEURA: Vascular low lung volumes with some crowding of perihilar and bibasilar bronchovascular structures. No focal pulmonary opacity. No pulmonary edema. No pleural effusion. No pneumothorax. HEART AND MEDIASTINUM: No acute abnormality of the cardiac and mediastinal silhouettes. BONES AND SOFT TISSUES: No acute osseous abnormality. IMPRESSION: 1. No acute findings. 2. Low lung volumes with crowding of perihilar and bibasilar bronchovascular structures. Electronically signed by: Katheleen Faes MD 03/28/2024 07:27 AM EDT RP Workstation: [id number]   CT Angio Chest PE W and/or Wo Contrast Result Date: 03/28/2024 EXAM: CTA of the Chest with contrast for PE 03/28/2024 07:02:57 AM TECHNIQUE: CTA of the chest was performed after the administration of intravenous contrast. Multiplanar reformatted images are provided for review. MIP images are provided for review.  Automated exposure control, iterative reconstruction, and/or weight based adjustment of the mA/kV was utilized to reduce the radiation dose to as low as reasonably achievable. COMPARISON: PA and lateral radiographs of chest dated 07/06/2009. CLINICAL HISTORY: Pulmonary embolism (PE) suspected, high prob. FINDINGS: PULMONARY ARTERIES: Pulmonary arteries are adequately opacified for evaluation. No pulmonary embolism. Main pulmonary artery is normal in caliber. MEDIASTINUM: The heart and pericardium demonstrate no acute abnormality. There is no acute abnormality of the thoracic aorta. LYMPH NODES: No mediastinal, hilar or axillary lymphadenopathy. LUNGS AND PLEURA: There are hazy and streaky opacities present within the base of the right lower lobe, likely representing a combination of edema and atelectasis. There are also hazy opacities present in the left lung base. The nondependent lungs are clear. No pleural effusion or pneumothorax. UPPER ABDOMEN: Limited images of the upper abdomen  are unremarkable. SOFT TISSUES AND BONES: No acute bone or soft tissue abnormality. IMPRESSION: 1. No evidence of pulmonary embolism. 2. Hazy and streaky opacities in the right lower lobe base, likely representing a combination of edema and atelectasis. 3. Hazy opacities in the left lung base. Electronically signed by: evalene coho 03/28/2024 07:19 AM EDT RP Workstation: [id number]   CT ANGIO HEAD NECK W WO CM W PERF (CODE STROKE) Result Date: 03/28/2024 CLINICAL DATA:  48 year old female code stroke presentation. EXAM: CT ANGIOGRAPHY HEAD AND NECK CT PERFUSION BRAIN TECHNIQUE: Multidetector CT imaging of the head and neck was performed using the standard protocol during bolus administration of intravenous contrast. Multiplanar CT image reconstructions and MIPs were obtained to evaluate the vascular anatomy. Carotid stenosis measurements (when applicable) are obtained utilizing NASCET criteria, using the distal internal carotid diameter as the denominator. Multiphase CT imaging of the brain was performed following IV bolus contrast injection. Subsequent parametric perfusion maps were calculated using RAPID software. RADIATION DOSE REDUCTION: This exam was performed according to the departmental dose-optimization program which includes automated exposure control, adjustment of the mA and/or kV according to patient size and/or use of iterative reconstruction technique. CONTRAST:  OMNIPAQUE  IOHEXOL  350 MG/ML SOLN COMPARISON:  Plain head CT today 0550 hours. FINDINGS: CT Brain Perfusion Findings: ASPECTS: Unclear tumor versus infarct right middle frontal gyrus. CBF (<30%) Volume: Zero, with no parameter abnormality detected. Perfusion (Tmax>6.0s) volume: Zero, with no parameter abnormality detected. Mismatch Volume: Not applicablemL Infarction Location:Not applicable CTA NECK Skeleton: Carious dentition. No acute or suspicious osseous lesion identified. Upper chest: Negative. Other neck: Nonvascular neck  soft tissue spaces are within normal limits, no convincing neck mass or lymphadenopathy. Aortic arch: 4 vessel arch. Non dominant left vertebral artery arises directly from the arch. Mild calcified arch atherosclerosis is visible. Right carotid system: Streak artifact dense right subclavian venous contrast obscures the brachiocephalic artery and right CCA origin. Beginning in the right Common carotid artery at the level of the larynx there is abundant adherent low-density thrombus along the medial wall (series 12, image 110). Large volume of clot continues to the right carotid bifurcation, a distance of about 4 cm (series 14, image 52 with clot centered in the vessel on series 12, image 92. The thrombus however primarily extends into the right ECA at the bifurcation (series 12, image 88). Superimposed calcified posterior right ICA origin atherosclerosis. Right ICA is patent without stenosis to the skull base. Left carotid system: Negative left CCA origin. Positive for distal left CCA thrombus extending along a segment of about 2 cm beginning at the hypopharynx on series 2, image 97. This abates at the left carotid bifurcation.  And left ICA origin is patent without stenosis. Vertebral arteries: Obscured proximal right subclavian artery. Normal right vertebral artery origin. Dominant right vertebral artery is patent and negative to the skull base. Non dominant left vertebral artery is diminutive and arises directly from the arch. Late entry into the left transverse foramen. Left vertebral remains highly diminutive but patent to the skull base. CTA HEAD Posterior circulation: Diminutive left vertebral artery terminates in PICA. Right vertebral artery with patent PICA origin supplies the basilar. Both the right V4 segment and the basilar are diminutive but patent. No basilar stenosis identified. Fetal type PCA origins, especially the right. The basilar artery appears to terminates in patent SCA origins. Bilateral  posterior communicating arteries and bilateral PCA branches are patent and within normal limits. Anterior circulation: Both ICA siphons are patent with no plaque or stenosis. The right siphon appears somewhat dominant. Both posterior communicating artery origins are normal. Patent carotid termini, MCA and ACA origins. Mildly dominant right ACA A1 segment. Diminutive or absent anterior communicating artery. Bilateral ACA branches are within normal limits. Left MCA M1 segment and bifurcation appear patent without stenosis. No left MCA branch occlusion identified. Contralateral right MCA M1 segment and bifurcation also appear patent and relatively symmetrically enhancing (series 6, image 25). No right MCA branch occlusion is identified. And bilateral MCA enhancing branch density appears fairly symmetric. Venous sinuses: Early contrast timing, the superior sagittal sinus and dominant left transverse and sigmoid venous sinuses appear patent along with the left IJ bulb. Anatomic variants: Diminutive vertebrobasilar system with dominant right and diminutive left vertebral arteries, the left arises directly from the arch and terminates in PICA. Other findings: Delayed postcontrast images of the brain are provided and demonstrate confluent patchy gyriform type enhancement affecting much of the abnormal right middle frontal gyrus (series 10, image 25 and series 16, image 37) in an area of about 4.5 cm. This abnormal enhancement continues inferiorly to the right frontal operculum (series 10, image 19). And there is similar abnormal patchy in gyriform enhancement at the posterior right temporal lobe, junction with the inferior right parietal lobe. Study discussed by telephone with Dr. Aisha Seals on 03/28/2024 at 06:22 . Review of the MIP images confirms the above findings IMPRESSION: 1. Constellation of acute thrombus in BOTH common carotid arteries (large volume on the Right), But no intracranial LVO. And multifocal  abnormal enhancement in the right cerebral hemisphere - which is not entirely specific - but suggestive of enhancing subacute infarcts. Underlying chronic posterior right MCA territory infarct. And noncontrast Head CT suggesting petechial hemorrhage in the presume subacute right middle frontal gyrus area. 2. Differential diagnosis discussion by telephone with Dr. Aisha Seals including septic emboli (such as Endocarditis related), hypercoagulable state from malignancy, versus congenital or other benign acquired coagulopathy. Also, he advises history of DVT, so large paradoxical emboli such as from cardiac septal defect are a consideration. 3. Negative CTP. Negative visible nonvascular spaces of the neck and upper chest. Electronically Signed   By: VEAR Hurst M.D.   On: 03/28/2024 06:27   CT HEAD CODE STROKE WO CONTRAST Result Date: 03/28/2024 CLINICAL DATA:  Code stroke.  48 year old female.  Possible seizure. EXAM: CT HEAD WITHOUT CONTRAST TECHNIQUE: Contiguous axial images were obtained from the base of the skull through the vertex without intravenous contrast. RADIATION DOSE REDUCTION: This exam was performed according to the departmental dose-optimization program which includes automated exposure control, adjustment of the mA and/or kV according to patient size and/or use of iterative reconstruction  technique. COMPARISON:  None Available. FINDINGS: Brain: Abnormal right frontal lobe, MCA territory. Posterior right MCA chronic appearing cortical encephalomalacia (series 2, image 22). Anterior to that abnormal mixed density obscuration of the normal right middle frontal gyrus architecture in an area of about 4 cm, series 2, image 20 and coronal image 42. Gray and white matter affected, although vasogenic edema there is not excluded. No midline shift, ventriculomegaly, intracranial mass effect. Basilar cisterns are patent. Outside of those areas gray-white matter differentiation appears normal. Vascular: No  suspicious intracranial vascular hyperdensity. Skull: Intact.  No acute osseous abnormality identified. Sinuses/Orbits: Visualized paranasal sinuses and mastoids are clear. Other: No gaze deviation. Visualized orbits and scalp soft tissues are within normal limits. ASPECTS [facility] Stroke Program Early CT Score) Total score (0-10 with 10 being normal): Unclear whether tumor versus heterogeneous infarct right middle frontal gyrus. IMPRESSION: Abnormal Right frontal lobe, with a combination of chronic posterior right MCA territory infarct (chronic encephalomalacia), but superimposed more anterior middle frontal gyrus 4 cm area of abnormal mixed density. And top differential considerations are recurrent right MCA infarct with hyperdense petechial hemorrhage, versus tumor with vasogenic edema. CTA is pending and delayed postcontrast images have been requested on that exam. Otherwise brain MRI without and with contrast would best characterize further. Study discussed by telephone with Dr. Dr. Aisha Seals on 03/28/2024 at 05:57 . Electronically Signed   By: VEAR Hurst M.D.   On: 03/28/2024 06:00     PHYSICAL EXAM  Temp:  [97.9 F (36.6 C)-98.7 F (37.1 C)] 97.9 F (36.6 C) (08/05 1200) Pulse Rate:  [89-111] 89 (08/05 1400) Resp:  [12-30] 20 (08/05 1400) BP: (118-142)/(77-93) 134/82 (08/05 1400) SpO2:  [95 %-100 %] 100 % (08/05 1400)  General - Well nourished, well developed, in no apparent distress.  Ophthalmologic - fundi not visualized due to noncooperation.  Cardiovascular - Regular rhythm and rate.  Mental Status -  Level of arousal and orientation to time, place, and person were intact. Language including expression, naming, repetition, comprehension was assessed and found intact. Fund of Knowledge was assessed and was intact.  Cranial Nerves II - XII - II - Visual field intact OU. III, IV, VI - Extraocular movements intact. V - Facial sensation decreased on the left VII - Facial  movement decreased on the left VIII - Hearing & vestibular intact bilaterally. X - Palate elevates symmetrically. XI - Chin turning & shoulder shrug intact bilaterally. XII - Tongue protrusion intact.  Motor Strength - The patient's strength was normal in RUE and RLE, however, left upper and lower extremity 4/5.  Bulk was normal and fasciculations were absent.   Motor Tone - Muscle tone was assessed at the neck and appendages and was normal.  Reflexes - The patient's reflexes were symmetrical in all extremities and she had no pathological reflexes.  Sensory - Light touch, temperature/pinprick were assessed and were decreased on the left.    Coordination - The patient had normal movements in the hands with no ataxia or dysmetria, however slow on the left.  Tremor was absent.  Gait and Station - deferred.   ASSESSMENT/PLAN Ms. RASHAUNDA RAHL is a 48 y.o. female with history of uncontrolled diabetes, depression, migraine, DVT in 2012 was on Coumadin  admitted for left-sided weakness.  Had witnessed seizure in CT.  No TNK given due to symptom resolved.    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  Multifocal abnormal enhancement in the right cerebral hemisphere, suggestive of enhancing subacute infarcts with epidural hemorrhage CT head repeat stable MRI with and without contrast Multiple foci of T2 hyperintensity and cortical enhancement in the right MCA territory with associated petechial hemorrhage and some restricted diffusion, consistent with subacute infarcts.  2D Echo EF 70 to 75% TEE pending LDL 132, TG 425 HgbA1c pending UDS positive for THC Autoimmune lab pending Hypercoagulable workup pending Heparin  IV for VTE prophylaxis No antithrombotic prior to admission, now on heparin  IV.   Ongoing aggressive stroke risk factor management Therapy recommendations: CIR Disposition: Pending  Seizure Witnessed seizure in CT On Keppra  1 g twice daily EEG normal with excessive beta activity likely due to benzo Seizure precautions  History of DVT ?  Hypercoagulable state History of left lower extremity DVT in 2012, thought due to smoking with OCP use at that time.  Was on Coumadin  at that time, off after several months. 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  No malignancy seen  Uncontrolled diabetes Hyperglycemia HgbA1c pending goal < 7.0 Uncontrolled Currently on insulin  drip CBG monitoring DM education and close PCP follow up  Hypertension Stable Long term BP goal normotensive  Hyperlipidemia Home meds: None LDL 132, TG 425, goal LDL < 70 Put on lipitor  80 Continue statin on discharge  Tobacco abuse Current occasional smoker Smoking cessation counseling provided Pt is willing to quit  Other Stroke Risk Factors Migraines Obstructive sleep apnea, on CPAP at home  Other Active Problems Leukocytosis, WBC 14.0--6.1 Depression Hypokalemia K 3.2 - supplement  Hospital day # 1  This patient is critically ill due to stroke with bilateral CCA thrombus, seizure and at significant risk of neurological worsening, death form recurrent stroke, hemorrhagic conversion, status epilepticus. This patient's care requires constant monitoring of vital signs, hemodynamics, respiratory and cardiac monitoring, review of multiple databases, neurological assessment, discussion with family, other specialists and medical decision making of high complexity. I spent 35 minutes of neurocritical care time in the care of this patient.  Ary Cummins, MD PhD Stroke Neurology 03/29/2024 2:36 PM    To contact Stroke Continuity provider, please refer to [email or web address]. After hours, contact  General Neurology

## 2024-03-29 NOTE — Progress Notes (Signed)
 PHARMACY - ANTICOAGULATION CONSULT NOTE  Pharmacy Consult for heparin  Indication: stroke  Allergies  Allergen Reactions   Dye Fdc Blue [Brilliant Blue Fcf (Fd&C Blue #1)] Hives    MRI   Other Nausea And Vomiting    Zole    Patient Measurements: Height: 5' 2 (157.5 cm) Weight: 62.1 kg (137 lb) IBW/kg (Calculated) : 50.1 HEPARIN  DW (KG): 62.1  Vital Signs: Temp: 98 F (36.7 C) (08/05 1600) Temp Source: Oral (08/05 1600) BP: 129/93 (08/05 1800) Pulse Rate: 96 (08/05 1800)  Labs: Recent Labs    03/28/24 0524 03/28/24 0538 03/28/24 0538 03/28/24 0546 03/28/24 0548 03/28/24 1433 03/28/24 2153 03/29/24 0718 03/29/24 1905  HGB  --  13.7   < > 16.0* 15.3*  --   --  13.0  --   HCT  --  40.9   < > 47.0* 45.0  --   --  38.0  --   PLT  --  341  --   --   --   --   --  335  --   APTT  --  29  --   --   --   --   --   --   --   LABPROT  --  13.0  --   --   --   --   --   --   --   INR  --  0.9  --   --   --   --   --   --   --   HEPARINUNFRC  --   --   --   --   --    < > <0.10* 0.19* 0.14*  CREATININE 0.95  --   --  0.80  --   --   --  0.49  --   TROPONINIHS  --   --   --   --   --   --   --  9  --    < > = values in this interval not displayed.    Estimated Creatinine Clearance: 77 mL/min (by C-G formula based on SCr of 0.49 mg/dL).   Assessment: 48yo female presents to ED for dizziness and weakness starting ~1wk ago, called as code stroke; CT and CTA reveal acute thrombus in both common carotid arteries and possible subacute infarcts as well as petechial hemorrhage >> to start heparin  infusion given risk<benefit given size and location of intraluminal thrombi.  Heparin  level subtherapeutic at 0.14 on heparin  1050 units/hr. No issues with heparin  infusion or bleeding.   Goal of Therapy:  Heparin  level 0.3-0.5 units/ml Monitor platelets by anticoagulation protocol: Yes   Plan:  Increase heparin  infusion to 1150 units/hr. Recheck heparin  level in 6hrs  Rocky Slade, PharmD, BCPS Clinical Pharmacist  Please check AMION for all [facility] Pharmacy phone numbers After 10:00 PM, call Main Pharmacy [phone]

## 2024-03-29 NOTE — H&P (View-Only) (Signed)
 STROKE TEAM PROGRESS NOTE   SUBJECTIVE (INTERVAL HISTORY) Her mom is at the bedside.  Pt sitting in chair, no acute event overnight. No HA. On heparin  IV.  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    OBJECTIVE Temp:  [97.9 F (36.6 C)-98.7 F (37.1 C)] 97.9 F (36.6 C) (08/05 1200) Pulse Rate:  [89-111] 89 (08/05 1400) Cardiac Rhythm: Sinus tachycardia (08/05 1200) Resp:  [12-30] 20 (08/05 1400) BP: (118-142)/(77-93) 134/82 (08/05 1400) SpO2:  [95 %-100 %] 100 % (08/05 1400)  Recent Labs  Lab 03/28/24 2341 03/29/24 0103 03/29/24 0345 03/29/24 0747 03/29/24 1142  GLUCAP 174* 123* 148* 274* 220*   Recent Labs  Lab 03/28/24 0524 03/28/24 0546 03/28/24 0548 03/29/24 0718  NA 136 138 137 136  K 3.5 3.7 3.7 3.2*  CL 99 98  --  104  CO2 24  --   --  24  GLUCOSE 504* 500*  --  238*  BUN 6 7  --  <5*  CREATININE 0.95 0.80  --  0.49  CALCIUM  9.3  --   --  8.8*  MG  --   --   --  1.7  PHOS  --   --   --  4.4   Recent Labs  Lab 03/28/24 0524  AST 22  ALT 10  ALKPHOS 93  BILITOT 0.7  PROT 6.8  ALBUMIN 3.2*   Recent Labs  Lab 03/28/24 0538 03/28/24 0546 03/28/24 0548 03/29/24 0718  WBC 14.0*  --   --  10.4  NEUTROABS 9.6*  --   --   --   HGB 13.7 16.0* 15.3* 13.0  HCT 40.9 47.0* 45.0 38.0  MCV 88.1  --   --  86.0  PLT 341  --   --  335   No results for input(s): CKTOTAL, CKMB, CKMBINDEX, TROPONINI in the last 168 hours. Recent Labs    03/28/24 0538  LABPROT 13.0  INR 0.9   Recent Labs    03/28/24 0542  COLORURINE STRAW*  LABSPEC 1.031*  PHURINE 7.0  GLUCOSEU >=500*  HGBUR MODERATE*  BILIRUBINUR NEGATIVE  KETONESUR NEGATIVE  PROTEINUR 30*  NITRITE NEGATIVE  LEUKOCYTESUR NEGATIVE       Component Value Date/Time   CHOL 233 (H) 03/29/2024 0718   TRIG 425 (H) 03/29/2024 0718   HDL 38 (L) 03/29/2024 0718   CHOLHDL 6.1 03/29/2024 0718   VLDL UNABLE TO CALCULATE IF TRIGLYCERIDE OVER 400 mg/dL 91/94/7974 9281   LDLCALC UNABLE TO  CALCULATE IF TRIGLYCERIDE OVER 400 mg/dL 91/94/7974 9281   Lab Results  Component Value Date   HGBA1C 8.5 (H) 07/08/2019      Component Value Date/Time   LABOPIA NONE DETECTED 03/28/2024 0542   COCAINSCRNUR NONE DETECTED 03/28/2024 0542   LABBENZ NONE DETECTED 03/28/2024 0542   AMPHETMU NONE DETECTED 03/28/2024 0542   THCU POSITIVE (A) 03/28/2024 0542   LABBARB NONE DETECTED 03/28/2024 0542    Recent Labs  Lab 03/28/24 0524  ETH <15    I have personally reviewed the radiological images below and agree with the radiology interpretations.  ECHOCARDIOGRAM LIMITED BUBBLE STUDY Result Date: 03/28/2024    ECHOCARDIOGRAM LIMITED REPORT   Patient Name:   JOSS FRIEDEL Date of Exam: 03/28/2024 Medical Rec #:  [id number]           Height:       62.0 in Accession #:    [id number]          Weight:  137.0 lb Date of Birth:  [birth year]          BSA:          1.628 m Patient Age:    48 years            BP:           132/81 mmHg Patient Gender: F                   HR:           102 bpm. Exam Location:  Inpatient Procedure: Limited Echo (Both Spectral and Color Flow Doppler were utilized            during procedure). Indications:    patent foramen ovale  History:        Patient has prior history of Echocardiogram examinations, most                 recent 03/28/2024. Risk Factors:Current Smoker.  Sonographer:    Tinnie Barefoot RDCS Referring Phys: 68 MURALI RAMASWAMY  Conclusion(s)/Recommendation(s): Limited study to assess for intercardiac shunting. Limited visualization, but no large shunt seen. Consider TEE if further evaluation needed. FINDINGS  Left Ventricle: IAS/Shunts: Agitated saline contrast was given intravenously to evaluate for intracardiac shunting. Morene Brownie Electronically signed by Morene Brownie Signature Date/Time: 03/28/2024/4:45:10 PM    Final    CT HEAD WO CONTRAST ( ) Result Date: 03/28/2024 EXAM: CT HEAD WITHOUT 03/28/2024 12:50:40 PM TECHNIQUE: CT of the head was  performed without the administration of intravenous contrast. Automated exposure control, iterative reconstruction, and/or weight based adjustment of the mA/kV was utilized to reduce the radiation dose to as low as reasonably achievable. COMPARISON: CT angiogram of the head and neck dated 03/28/2024 and CT of the head dated 03/28/2024. CLINICAL HISTORY: Stroke, follow up. FINDINGS: BRAIN AND VENTRICLES: No acute intracranial hemorrhage. No mass effect or midline shift. No extra-axial fluid collection. No hydrocephalus. Encephalomalacia changes again demonstrated within the right parietal lobe. There is increased attenuation within the cortex of the right frontal lobe and obscuration of the cerebral sulci suggesting contrast staining from either regional cerebritis or ischemic tissue. ORBITS: No acute abnormality. SINUSES AND MASTOIDS: No acute abnormality. SOFT TISSUES AND SKULL: No acute skull fracture. No acute soft tissue abnormality. IMPRESSION: 1. Encephalomalacia changes in the right parietal lobe. 2. Increased attenuation in the right frontal lobe cortex and obscuration of the cerebral sulci, suggestive of contrast staining from either regional cerebritis or ischemic tissue. Electronically signed by: evalene coho 03/28/2024 01:09 PM EDT RP Workstation: [id number]   EEG adult Result Date: 03/28/2024 Gregg Lek, MD     03/28/2024 11:42 AM Patient Name: OREE MIRELEZ MRN: [id number] Epilepsy Attending: Lek Gregg Referring Physician/Provider: No ref. provider found     Date: 03/28/2024 Duration: 23 minutes Patient history:  48 y.o. 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 EEG to rule out subclinical seizures Level of alertness: Drowsy, sleep, lethargic AEDs during EEG study: Technical aspects: This EEG study was done with scalp electrodes positioned according to the 10-20  International system of electrode placement. Electrical activity was reviewed with band pass filter of 1-70Hz , sensitivity of 7 uV/mm, display speed of 23mm/sec with a 60Hz  notched filter applied as appropriate. EEG data were recorded continuously and digitally stored.  Video monitoring was available and reviewed as appropriate. Description: The posterior dominant rhythm consists of  12-13 Hz activity of moderate voltage (25-35 uV) seen predominantly in posterior head regions, symmetric and reactive to eye opening and eye closing. Drowsiness was characterized by attenuation of the posterior background rhythm. Sleep was characterized by vertex waves, sleep spindles (12 to 14 Hz), maximal frontocentral region.  There is an excessive amount of 15 to 18 Hz, 2-3 uV beta activity with irregular morphology distributed symmetrically and diffusely. Hyperventilation and photic stimulation were not performed.   ABNORMALITY - Excessive beta, generalized IMPRESSION: This study is within normal limits. No seizures or epileptiform discharges were seen throughout the recording. The excessive beta activity seen in the background is most likely due to the effect of benzodiazepine and is a benign EEG pattern. A normal interictal EEG does not exclude nor support the diagnosis of epilepsy. Pastor Falling MD Neurology    ECHOCARDIOGRAM COMPLETE Result Date: 03/28/2024    ECHOCARDIOGRAM REPORT   Patient Name:   ANJALINA BERGEVIN Date of Exam: 03/28/2024 Medical Rec #:  [id number]           Height:       62.0 in Accession #:    [id number]          Weight:       137.0 lb Date of Birth:  April 30, 1976          BSA:          1.628 m Patient Age:    48 years            BP:           141/119 mmHg Patient Gender: F                   HR:           120 bpm. Exam Location:  Inpatient Procedure: 2D Echo, Cardiac Doppler and Color Doppler (Both Spectral and Color            Flow Doppler were utilized during procedure). Indications:    Stroke  History:         Patient has no prior history of Echocardiogram examinations. No                 relevant cardiac history.  Sonographer:    Therisa Crouch Referring Phys: [id number] MADISON KOMMOR  Sonographer Comments: Technically difficult study due to poor echo windows. IMPRESSIONS  1. Mild intercavitary gradient noted (<28mmHg) due to hyperdynamic and underfilled LV. Left ventricular ejection fraction, by estimation, is 70 to 75%. The left ventricle has hyperdynamic function. The left ventricle has no regional wall motion abnormalities. Indeterminate diastolic filling due to E-A fusion.  2. Right ventricular systolic function is normal. The right ventricular size is normal.  3. The mitral valve is normal in structure. No evidence of mitral valve regurgitation. No evidence of mitral stenosis.  4. The aortic valve is tricuspid. Aortic valve regurgitation is not visualized. Aortic valve sclerosis is present, with no evidence of aortic valve stenosis.  5. The inferior vena cava is normal in size with greater than 50% respiratory variability, suggesting right atrial pressure of 3 mmHg. FINDINGS  Left Ventricle: Mild intercavitary gradient noted (<14mmHg) due to hyperdynamic and underfilled LV. Left ventricular ejection fraction, by estimation, is 70 to 75%. The left ventricle has hyperdynamic function. The left ventricle has no regional wall motion abnormalities. The left ventricular internal cavity size was normal in size. There is no left ventricular hypertrophy. Indeterminate diastolic filling due to E-A fusion. Right Ventricle: The right  ventricular size is normal. No increase in right ventricular wall thickness. Right ventricular systolic function is normal. Left Atrium: Left atrial size was normal in size. Right Atrium: Right atrial size was normal in size. Pericardium: There is no evidence of pericardial effusion. Mitral Valve: The mitral valve is normal in structure. No evidence of mitral valve regurgitation. No evidence of  mitral valve stenosis. Tricuspid Valve: The tricuspid valve is normal in structure. Tricuspid valve regurgitation is not demonstrated. No evidence of tricuspid stenosis. Aortic Valve: The aortic valve is tricuspid. Aortic valve regurgitation is not visualized. Aortic valve sclerosis is present, with no evidence of aortic valve stenosis. Aortic valve mean gradient measures 3.0 mmHg. Aortic valve peak gradient measures 6.1  mmHg. Aortic valve area, by VTI measures 1.84 cm. Pulmonic Valve: The pulmonic valve was normal in structure. Pulmonic valve regurgitation is not visualized. No evidence of pulmonic stenosis. Aorta: The aortic root is normal in size and structure. Venous: The inferior vena cava is normal in size with greater than 50% respiratory variability, suggesting right atrial pressure of 3 mmHg. IAS/Shunts: The interatrial septum was not well visualized.  LEFT VENTRICLE PLAX 2D LVIDd:         4.40 cm LVIDs:         3.10 cm LV PW:         0.90 cm LV IVS:        1.00 cm LVOT diam:     1.80 cm LV SV:         35 LV SV Index:   21 LVOT Area:     2.54 cm  IVC IVC diam: 0.90 cm LEFT ATRIUM             Index LA diam:        2.70 cm 1.66 cm/m LA Vol (A2C):   21.5 ml 13.21 ml/m LA Vol (A4C):   27.7 ml 17.02 ml/m LA Biplane Vol: 26.2 ml 16.10 ml/m  AORTIC VALVE AV Area (Vmax):    1.81 cm AV Area (Vmean):   1.88 cm AV Area (VTI):     1.84 cm AV Vmax:           123.00 cm/s AV Vmean:          86.900 cm/s AV VTI:            0.189 m AV Peak Grad:      6.1 mmHg AV Mean Grad:      3.0 mmHg LVOT Vmax:         87.50 cm/s LVOT Vmean:        64.200 cm/s LVOT VTI:          0.137 m LVOT/AV VTI ratio: 0.72  AORTA Ao Root diam: 3.00 cm Ao Asc diam:  2.60 cm  SHUNTS Systemic VTI:  0.14 m Systemic Diam: 1.80 cm Morene Brownie Electronically signed by Morene Brownie Signature Date/Time: 03/28/2024/8:49:23 AM    Final    CT VENOGRAM ABD/PELVIS/LOWER EXT BILAT Addendum Date: 03/28/2024 ADDENDUM #1 ADDENDUM: The above findings  were discussed with Dr. Simon at 07:41 am 03/28/2024. ---------------------------------------------------- Electronically signed by: timothy berrigan 03/28/2024 07:45 AM EDT RP Workstation: [id number]   Result Date: 03/28/2024 ORIGINAL REPORT  EXAM: CTA ABDOMEN AND PELVIS WITH CONTRAST AND RUNOFF CTA OF THE LOWER EXTREMITIES WITH CONTRAST 03/28/2024 07:09:15 AM TECHNIQUE: CTA images of the abdomen, pelvis and lower extremities with intravenous contrast. Three-dimensional MIP/volume rendered formations were performed. Automated exposure control, iterative reconstruction, and/or weight based adjustment of the  mA/kV was utilized to reduce the radiation dose to as low as reasonably achievable. COMPARISON: None available. CLINICAL HISTORY: Deep venous thrombosis (DVT). FINDINGS: VASCULATURE: AORTA: No acute finding. No abdominal aortic aneurysm. No dissection. There is moderate calcific atheromatous disease within the aortoiliac arteries. CELIAC TRUNK: No acute finding. No occlusion or significant stenosis. SUPERIOR MESENTERIC ARTERY: No acute finding. No occlusion or significant stenosis. The mesenteric arteries are patent. INFERIOR MESENTERIC ARTERY: No acute finding. No occlusion or significant stenosis. RENAL ARTERIES: No acute finding. No occlusion or significant stenosis. The renal arteries are patent. RIGHT ILIAC ARTERIES: No acute finding. No occlusion or significant stenosis. RIGHT FEMORAL ARTERIES: No acute finding. No occlusion or significant stenosis. RIGHT POPLITEAL ARTERY: No acute finding. No occlusion or significant stenosis. RIGHT CALF ARTERIES: No acute finding. No occlusion or significant stenosis. LEFT ILIAC ARTERIES: No acute finding. No occlusion or significant stenosis. LEFT FEMORAL ARTERIES: The left superficial femoral artery is completely occluded. LEFT POPLITEAL ARTERY: No acute finding. No occlusion or significant stenosis. LEFT CALF ARTERIES: No acute finding. No occlusion or significant  stenosis. ABDOMEN AND PELVIS: LOWER CHEST: There are ground-glass and streaky opacities present within the right lower lobe and ground-glass opacities present dependently within the left lower lobe. LIVER: The liver is unremarkable. GALLBLADDER AND BILE DUCTS: Gallbladder is unremarkable. No biliary ductal dilatation. SPLEEN: The spleen is unremarkable. PANCREAS: The pancreas is unremarkable. ADRENAL GLANDS: Bilateral adrenal glands demonstrate no acute abnormality. KIDNEYS, URETERS AND BLADDER: No stones in the kidneys or ureters. No hydronephrosis. No evidence of perinephric or periureteral stranding. Urinary bladder is unremarkable. There is left renal cortical atrophy and scarring. GI AND Bowel: Stomach and duodenal sweep demonstrate no acute abnormality. There is no bowel obstruction. No abnormal bowel wall thickening or distension. The patient is status post partial colectomy. Anastomosis sutures are noted in the right pelvis. REPRODUCTIVE: Reproductive organs are unremarkable. PERITONEUM AND RETRPERITONEUM: No ascites or free air. A periumbilical fat-containing hernia is present. LYMPH NODES: No evidence of lymphadenopathy. BONES AND SOFT TISSUES: No acute abnormality of the bones. No acute soft tissue abnormality. VASCULATURE: The inferior vena cava and iliac veins are patent as are the femoral veins. IMPRESSION: 1. Left superficial femoral artery occlusion. 2. 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 3. Ground-glass and streaky opacities in the right lower lobe and ground-glass opacities in the left lower lobe. 4. Left renal cortical atrophy and scarring. 5. Periumbilical fat-containing hernia. 6. Status post partial colectomy with anastomosis sutures in the right pelvis. Electronically signed by: evalene coho 03/28/2024 07:33 AM EDT RP Workstation: [id number]   DG Chest Portable 1 View Result Date: 03/28/2024 EXAM: 1 VIEW XRAY OF THE CHEST 03/28/2024 07:22:00 AM  COMPARISON: 07/06/2009 CLINICAL HISTORY: Concern for PNA. FINDINGS: LUNGS AND PLEURA: Vascular low lung volumes with some crowding of perihilar and bibasilar bronchovascular structures. No focal pulmonary opacity. No pulmonary edema. No pleural effusion. No pneumothorax. HEART AND MEDIASTINUM: No acute abnormality of the cardiac and mediastinal silhouettes. BONES AND SOFT TISSUES: No acute osseous abnormality. IMPRESSION: 1. No acute findings. 2. Low lung volumes with crowding of perihilar and bibasilar bronchovascular structures. Electronically signed by: Katheleen Faes MD 03/28/2024 07:27 AM EDT RP Workstation: [id number]   CT Angio Chest PE W and/or Wo Contrast Result Date: 03/28/2024 EXAM: CTA of the Chest with contrast for PE 03/28/2024 07:02:57 AM TECHNIQUE: CTA of the chest was performed after the administration of intravenous contrast. Multiplanar reformatted images are provided for review. MIP images are provided for review.  Automated exposure control, iterative reconstruction, and/or weight based adjustment of the mA/kV was utilized to reduce the radiation dose to as low as reasonably achievable. COMPARISON: PA and lateral radiographs of chest dated 07/06/2009. CLINICAL HISTORY: Pulmonary embolism (PE) suspected, high prob. FINDINGS: PULMONARY ARTERIES: Pulmonary arteries are adequately opacified for evaluation. No pulmonary embolism. Main pulmonary artery is normal in caliber. MEDIASTINUM: The heart and pericardium demonstrate no acute abnormality. There is no acute abnormality of the thoracic aorta. LYMPH NODES: No mediastinal, hilar or axillary lymphadenopathy. LUNGS AND PLEURA: There are hazy and streaky opacities present within the base of the right lower lobe, likely representing a combination of edema and atelectasis. There are also hazy opacities present in the left lung base. The nondependent lungs are clear. No pleural effusion or pneumothorax. UPPER ABDOMEN: Limited images of the upper abdomen  are unremarkable. SOFT TISSUES AND BONES: No acute bone or soft tissue abnormality. IMPRESSION: 1. No evidence of pulmonary embolism. 2. Hazy and streaky opacities in the right lower lobe base, likely representing a combination of edema and atelectasis. 3. Hazy opacities in the left lung base. Electronically signed by: evalene coho 03/28/2024 07:19 AM EDT RP Workstation: [id number]   CT ANGIO HEAD NECK W WO CM W PERF (CODE STROKE) Result Date: 03/28/2024 CLINICAL DATA:  48 year old female code stroke presentation. EXAM: CT ANGIOGRAPHY HEAD AND NECK CT PERFUSION BRAIN TECHNIQUE: Multidetector CT imaging of the head and neck was performed using the standard protocol during bolus administration of intravenous contrast. Multiplanar CT image reconstructions and MIPs were obtained to evaluate the vascular anatomy. Carotid stenosis measurements (when applicable) are obtained utilizing NASCET criteria, using the distal internal carotid diameter as the denominator. Multiphase CT imaging of the brain was performed following IV bolus contrast injection. Subsequent parametric perfusion maps were calculated using RAPID software. RADIATION DOSE REDUCTION: This exam was performed according to the departmental dose-optimization program which includes automated exposure control, adjustment of the mA and/or kV according to patient size and/or use of iterative reconstruction technique. CONTRAST:  OMNIPAQUE  IOHEXOL  350 MG/ML SOLN COMPARISON:  Plain head CT today 0550 hours. FINDINGS: CT Brain Perfusion Findings: ASPECTS: Unclear tumor versus infarct right middle frontal gyrus. CBF (<30%) Volume: Zero, with no parameter abnormality detected. Perfusion (Tmax>6.0s) volume: Zero, with no parameter abnormality detected. Mismatch Volume: Not applicablemL Infarction Location:Not applicable CTA NECK Skeleton: Carious dentition. No acute or suspicious osseous lesion identified. Upper chest: Negative. Other neck: Nonvascular neck  soft tissue spaces are within normal limits, no convincing neck mass or lymphadenopathy. Aortic arch: 4 vessel arch. Non dominant left vertebral artery arises directly from the arch. Mild calcified arch atherosclerosis is visible. Right carotid system: Streak artifact dense right subclavian venous contrast obscures the brachiocephalic artery and right CCA origin. Beginning in the right Common carotid artery at the level of the larynx there is abundant adherent low-density thrombus along the medial wall (series 12, image 110). Large volume of clot continues to the right carotid bifurcation, a distance of about 4 cm (series 14, image 52 with clot centered in the vessel on series 12, image 92. The thrombus however primarily extends into the right ECA at the bifurcation (series 12, image 88). Superimposed calcified posterior right ICA origin atherosclerosis. Right ICA is patent without stenosis to the skull base. Left carotid system: Negative left CCA origin. Positive for distal left CCA thrombus extending along a segment of about 2 cm beginning at the hypopharynx on series 2, image 97. This abates at the left carotid bifurcation.  And left ICA origin is patent without stenosis. Vertebral arteries: Obscured proximal right subclavian artery. Normal right vertebral artery origin. Dominant right vertebral artery is patent and negative to the skull base. Non dominant left vertebral artery is diminutive and arises directly from the arch. Late entry into the left transverse foramen. Left vertebral remains highly diminutive but patent to the skull base. CTA HEAD Posterior circulation: Diminutive left vertebral artery terminates in PICA. Right vertebral artery with patent PICA origin supplies the basilar. Both the right V4 segment and the basilar are diminutive but patent. No basilar stenosis identified. Fetal type PCA origins, especially the right. The basilar artery appears to terminates in patent SCA origins. Bilateral  posterior communicating arteries and bilateral PCA branches are patent and within normal limits. Anterior circulation: Both ICA siphons are patent with no plaque or stenosis. The right siphon appears somewhat dominant. Both posterior communicating artery origins are normal. Patent carotid termini, MCA and ACA origins. Mildly dominant right ACA A1 segment. Diminutive or absent anterior communicating artery. Bilateral ACA branches are within normal limits. Left MCA M1 segment and bifurcation appear patent without stenosis. No left MCA branch occlusion identified. Contralateral right MCA M1 segment and bifurcation also appear patent and relatively symmetrically enhancing (series 6, image 25). No right MCA branch occlusion is identified. And bilateral MCA enhancing branch density appears fairly symmetric. Venous sinuses: Early contrast timing, the superior sagittal sinus and dominant left transverse and sigmoid venous sinuses appear patent along with the left IJ bulb. Anatomic variants: Diminutive vertebrobasilar system with dominant right and diminutive left vertebral arteries, the left arises directly from the arch and terminates in PICA. Other findings: Delayed postcontrast images of the brain are provided and demonstrate confluent patchy gyriform type enhancement affecting much of the abnormal right middle frontal gyrus (series 10, image 25 and series 16, image 37) in an area of about 4.5 cm. This abnormal enhancement continues inferiorly to the right frontal operculum (series 10, image 19). And there is similar abnormal patchy in gyriform enhancement at the posterior right temporal lobe, junction with the inferior right parietal lobe. Study discussed by telephone with Dr. Aisha Seals on 03/28/2024 at 06:22 . Review of the MIP images confirms the above findings IMPRESSION: 1. Constellation of acute thrombus in BOTH common carotid arteries (large volume on the Right), But no intracranial LVO. And multifocal  abnormal enhancement in the right cerebral hemisphere - which is not entirely specific - but suggestive of enhancing subacute infarcts. Underlying chronic posterior right MCA territory infarct. And noncontrast Head CT suggesting petechial hemorrhage in the presume subacute right middle frontal gyrus area. 2. Differential diagnosis discussion by telephone with Dr. Aisha Seals including septic emboli (such as Endocarditis related), hypercoagulable state from malignancy, versus congenital or other benign acquired coagulopathy. Also, he advises history of DVT, so large paradoxical emboli such as from cardiac septal defect are a consideration. 3. Negative CTP. Negative visible nonvascular spaces of the neck and upper chest. Electronically Signed   By: VEAR Hurst M.D.   On: 03/28/2024 06:27   CT HEAD CODE STROKE WO CONTRAST Result Date: 03/28/2024 CLINICAL DATA:  Code stroke.  48 year old female.  Possible seizure. EXAM: CT HEAD WITHOUT CONTRAST TECHNIQUE: Contiguous axial images were obtained from the base of the skull through the vertex without intravenous contrast. RADIATION DOSE REDUCTION: This exam was performed according to the departmental dose-optimization program which includes automated exposure control, adjustment of the mA and/or kV according to patient size and/or use of iterative reconstruction  technique. COMPARISON:  None Available. FINDINGS: Brain: Abnormal right frontal lobe, MCA territory. Posterior right MCA chronic appearing cortical encephalomalacia (series 2, image 22). Anterior to that abnormal mixed density obscuration of the normal right middle frontal gyrus architecture in an area of about 4 cm, series 2, image 20 and coronal image 42. Gray and white matter affected, although vasogenic edema there is not excluded. No midline shift, ventriculomegaly, intracranial mass effect. Basilar cisterns are patent. Outside of those areas gray-white matter differentiation appears normal. Vascular: No  suspicious intracranial vascular hyperdensity. Skull: Intact.  No acute osseous abnormality identified. Sinuses/Orbits: Visualized paranasal sinuses and mastoids are clear. Other: No gaze deviation. Visualized orbits and scalp soft tissues are within normal limits. ASPECTS [facility] Stroke Program Early CT Score) Total score (0-10 with 10 being normal): Unclear whether tumor versus heterogeneous infarct right middle frontal gyrus. IMPRESSION: Abnormal Right frontal lobe, with a combination of chronic posterior right MCA territory infarct (chronic encephalomalacia), but superimposed more anterior middle frontal gyrus 4 cm area of abnormal mixed density. And top differential considerations are recurrent right MCA infarct with hyperdense petechial hemorrhage, versus tumor with vasogenic edema. CTA is pending and delayed postcontrast images have been requested on that exam. Otherwise brain MRI without and with contrast would best characterize further. Study discussed by telephone with Dr. Dr. Aisha Seals on 03/28/2024 at 05:57 . Electronically Signed   By: VEAR Hurst M.D.   On: 03/28/2024 06:00     PHYSICAL EXAM  Temp:  [97.9 F (36.6 C)-98.7 F (37.1 C)] 97.9 F (36.6 C) (08/05 1200) Pulse Rate:  [89-111] 89 (08/05 1400) Resp:  [12-30] 20 (08/05 1400) BP: (118-142)/(77-93) 134/82 (08/05 1400) SpO2:  [95 %-100 %] 100 % (08/05 1400)  General - Well nourished, well developed, in no apparent distress.  Ophthalmologic - fundi not visualized due to noncooperation.  Cardiovascular - Regular rhythm and rate.  Mental Status -  Level of arousal and orientation to time, place, and person were intact. Language including expression, naming, repetition, comprehension was assessed and found intact. Fund of Knowledge was assessed and was intact.  Cranial Nerves II - XII - II - Visual field intact OU. III, IV, VI - Extraocular movements intact. V - Facial sensation decreased on the left VII - Facial  movement decreased on the left VIII - Hearing & vestibular intact bilaterally. X - Palate elevates symmetrically. XI - Chin turning & shoulder shrug intact bilaterally. XII - Tongue protrusion intact.  Motor Strength - The patient's strength was normal in RUE and RLE, however, left upper and lower extremity 4/5.  Bulk was normal and fasciculations were absent.   Motor Tone - Muscle tone was assessed at the neck and appendages and was normal.  Reflexes - The patient's reflexes were symmetrical in all extremities and she had no pathological reflexes.  Sensory - Light touch, temperature/pinprick were assessed and were decreased on the left.    Coordination - The patient had normal movements in the hands with no ataxia or dysmetria, however slow on the left.  Tremor was absent.  Gait and Station - deferred.   ASSESSMENT/PLAN Ms. RASHAUNDA RAHL is a 48 y.o. female with history of uncontrolled diabetes, depression, migraine, DVT in 2012 was on Coumadin  admitted for left-sided weakness.  Had witnessed seizure in CT.  No TNK given due to symptom resolved.    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  Multifocal abnormal enhancement in the right cerebral hemisphere, suggestive of enhancing subacute infarcts with epidural hemorrhage CT head repeat stable MRI with and without contrast Multiple foci of T2 hyperintensity and cortical enhancement in the right MCA territory with associated petechial hemorrhage and some restricted diffusion, consistent with subacute infarcts.  2D Echo EF 70 to 75% TEE pending LDL 132, TG 425 HgbA1c pending UDS positive for THC Autoimmune lab pending Hypercoagulable workup pending Heparin  IV for VTE prophylaxis No antithrombotic prior to admission, now on heparin  IV.   Ongoing aggressive stroke risk factor management Therapy recommendations: CIR Disposition: Pending  Seizure Witnessed seizure in CT On Keppra  1 g twice daily EEG normal with excessive beta activity likely due to benzo Seizure precautions  History of DVT ?  Hypercoagulable state History of left lower extremity DVT in 2012, thought due to smoking with OCP use at that time.  Was on Coumadin  at that time, off after several months. 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  No malignancy seen  Uncontrolled diabetes Hyperglycemia HgbA1c pending goal < 7.0 Uncontrolled Currently on insulin  drip CBG monitoring DM education and close PCP follow up  Hypertension Stable Long term BP goal normotensive  Hyperlipidemia Home meds: None LDL 132, TG 425, goal LDL < 70 Put on lipitor  80 Continue statin on discharge  Tobacco abuse Current occasional smoker Smoking cessation counseling provided Pt is willing to quit  Other Stroke Risk Factors Migraines Obstructive sleep apnea, on CPAP at home  Other Active Problems Leukocytosis, WBC 14.0--6.1 Depression Hypokalemia K 3.2 - supplement  Hospital day # 1  This patient is critically ill due to stroke with bilateral CCA thrombus, seizure and at significant risk of neurological worsening, death form recurrent stroke, hemorrhagic conversion, status epilepticus. This patient's care requires constant monitoring of vital signs, hemodynamics, respiratory and cardiac monitoring, review of multiple databases, neurological assessment, discussion with family, other specialists and medical decision making of high complexity. I spent 35 minutes of neurocritical care time in the care of this patient.  Ary Cummins, MD PhD Stroke Neurology 03/29/2024 2:36 PM    To contact Stroke Continuity provider, please refer to [email or web address]. After hours, contact  General Neurology

## 2024-03-29 NOTE — H&P (Signed)
 NAME:  Terri [name], MRN:  [id number], DOB:  [birth year], LOS: 1 ADMISSION DATE:  03/28/2024, CONSULTATION DATE: 8/4 REFERRING MD: Dr. Albertina EDP, CHIEF COMPLAINT: Stroke  History of Present Illness:  48 year old female with PMH as below, which is significant for DVT, Bipolar, Schizoaffective disorder, and migraines presented to [facility] ED 8/4 with complaints of weakness and AMS. Onset of symptoms was approximately 8/3 around 2100. There were also witnessed episodes of seizure like activity (body tightening and arms jumping). Upon arrival to the ED she was found to have flaccid paralysis of the left upper and lower extremities. She was treated as a Code Stroke and was taken immediately to the CT scanner, which showed now LVO, but was positive for acute thrombus in both common carotid arteries as well as enhancement in the right cerebral hemisphere concerning for subacute infarcts. Petechial hemorrhage also seen. She was evaluated by neurology and had a witnessed seizure which was successfully abated with IV ativan . Heparin  infusion started by neurology after weighing risks considering petechial hemorrhage. PCCM asked to evaluate for ICU admission.   Pertinent  Medical History   has a past medical history of DEPRESSION (10/18/2009), ENDOMETRIOSIS (10/18/2009), GERD (10/18/2009), History of HPV infection, MIGRAINE HEADACHE (10/18/2009), and UNSPECIFIED PERIPHERAL VASCULAR DISEASE (10/18/2009).   Significant Hospital Events: Including procedures, antibiotic start and stop dates in addition to other pertinent events   03/28/2024 - ADMIT  Interim History / Subjective:   8/5 pn room air. Sugars high and insulin  scale adjuted. On heparin  gtt. Aferbrile. MRSA PCR neg.  PT noted that patien it is aware of situation and deficits and wants ti improve.  Inpatient rehab being recommended  MRI today Multiple foci of T2 hyperintensity and cortical enhancement in the right MCA territory with associated  petechial hemorrhage and some restricted diffusion, consistent with subacute infarcts. The lack of restricted diffusion and extensive enhancement suggests these infarctions are greater than 48 hours old. 2. More remote infarct and Encephalomalacia of the right parietal lobe. 3. No midline shift.  Objective    Blood pressure 121/89, pulse 98, temperature 97.9 F (36.6 C), temperature source Oral, resp. rate 16, height 5' 2 (1.575 m), weight 62.1 kg, SpO2 99%.        Intake/Output Summary (Last 24 hours) at 03/29/2024 1126 Last data filed at 03/29/2024 0800 Gross per 24 hour  Intake 247.18 ml  Output 2725 ml  Net -2477.82 ml   Filed Weights   03/28/24 0515  Weight: 62.1 kg    Examination: General Appearance:  Looks much bette4 Head:  Normocephalic, without obvious abnormality, atraumatic Eyes:  PERRL - yes, conjunctiva/corneas - mudd     Ears:  Normal external ear canals, both ears Nose:  G tube - no Throat:  ETT TUBE - no , OG tube - no Neck:  Supple,  No enlargement/tenderness/nodules Lungs: Clear to auscultation bilaterally Heart:  S1 and S2 normal, no murmur, CVP - no.  Pressors - no Abdomen:  Soft, no masses, no organomegaly Genitalia / Rectal:  Not done Extremities:  Extremities- intact Skin:  ntact in exposed areas . Sacral area - not examine Neurologic:  Sedation - none -> RASS - +1 . Moves all 4s - yes mild LUE weakness bbut no major deficits. CAM-ICU - neg . Orientation - x3+     CT CTA head 8/4: acute thrombus in both common carotid arteries (larger on right), no intracranial LVO. Multifocal abnormal enhancement in the R cerebral hemisphere. Petechial hemorrhage in  the presumed subacute right middle frontal gyrus area.  CTA chest: No evidence of PE. RLL edema vs atelectasis. Hazy opacity left lung base  CT venogram abd/pelvis:  L superficial femoral artery occlusion. Periumbilical fat-containing hernia.   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 LUE Weak. Bubble sstudy negative . ECHO - Mild intercavitary gradient noted (<36mmHg) due to hyperdynamic and  underfilled LV. L   PLAN - per neurology/stroke service - heparin  infusion -- Blood cultures, echo to rule out IE.   Seizure  - none since admit  Pllan Pe neuro   Hyperglycemia with history of DM (A1C 8.5)  Plan   - insulin  protocol   L superior femoral artery occlusion - Heparin  - Vascular surgery consulted by EDP - repagd 03/29/24  Lactic acidosis: likely Dt sz - trend lactic.   Leukocytosis - BC pending - Defer ABX for now  Nutrition  Plan  - diet advanced  Schizoaffective Bipolar THC use  PLAN - Holding home olanzapine   (seems she was not taking  Best Practice (right click and Reselect all SmartList Selections daily)   Diet/type:  advancing diet and po DVT prophylaxis systemic heparin  Pressure ulcer(s): pressure ulcer assessment deferred  GI prophylaxis: N/A Lines: N/A Foley:  N/A Code Status:  full code Last date of multidisciplinary goals of care discussion []    8/5 -patient and mom updated  Disp0 d/w Dr Celinda and Jerri  -move to 3W floor and ccm off from 03/30/24    ATTESTATION & SIGNATURE     Dr. Dorethia Cave, M.D., [facility].C.P Pulmonary and Critical Care Medicine Staff Physician, [facility] Pager: [phone], If no answer or between  15:00h - 7:00h: call [phone]  03/29/2024 11:26 AM    LABS    PULMONARY Recent Labs  Lab 03/28/24 0546 03/28/24 0548  HCO3  --  27.2  TCO2 27 28  O2SAT  --  72    CBC Recent Labs  Lab 03/28/24 0538 03/28/24 0546 03/28/24 0548 03/29/24 0718  HGB 13.7 16.0* 15.3* 13.0  HCT 40.9 47.0* 45.0 38.0  WBC 14.0*  --   --  10.4  PLT 341  --   --  335    COAGULATION Recent Labs   Lab 03/28/24 0538  INR 0.9    CARDIAC  No results for input(s): TROPONINI in the last 168 hours. No results for input(s): PROBNP in the last 168 hours.   CHEMISTRY Recent Labs  Lab 03/28/24 0524 03/28/24 0546 03/28/24 0548 03/29/24 0718  NA 136 138 137 136  K 3.5 3.7 3.7 3.2*  CL 99 98  --  104  CO2 24  --   --  24  GLUCOSE 504* 500*  --  238*  BUN 6 7  --  <5*  CREATININE 0.95 0.80  --  0.49  CALCIUM  9.3  --   --  8.8*  MG  --   --   --  1.7  PHOS  --   --   --  4.4   Estimated Creatinine Clearance: 77 mL/min (by C-G formula based on SCr of 0.49 mg/dL).   LIVER Recent Labs  Lab 03/28/24 0524 03/28/24 0538  AST 22  --   ALT 10  --   ALKPHOS 93  --   BILITOT  0.7  --   PROT 6.8  --   ALBUMIN 3.2*  --   INR  --  0.9     INFECTIOUS Recent Labs  Lab 03/28/24 0548 03/29/24 0718  LATICACIDVEN 5.1* 1.1  PROCALCITON  --  <0.10     ENDOCRINE CBG (last 3)  Recent Labs    03/29/24 0103 03/29/24 0345 03/29/24 0747  GLUCAP 123* 148* 274*         IMAGING x48h  - image(s) personally visualized  -   highlighted in bold MR BRAIN W WO CONTRAST Result Date: 03/29/2024 EXAM: MRI BRAIN WITH AND WITHOUT CONTRAST 03/28/2024 09:20:00 PM TECHNIQUE: Multiplanar multisequence MRI of the head/brain was performed with and without the administration of intravenous contrast. 7mL gadobutrol  (GADAVIST ) 1 MMOL/ML injection 7 mL GADOBUTROL  1 MMOL/ML IV SOLN. COMPARISON: CT head without contrast 03/28/2024. CT angio head and neck 03/28/2024. CT perfusion 03/28/2024. CLINICAL HISTORY: Left-sided weakness, abnormal coordination, and seizures. Stroke, follow up. FINDINGS: BRAIN AND VENTRICLES: Multiple foci of T2 hyperintensity and cortical enhancement are present in the right MCA territory. An area of more chronic encephalomalacia is present in the right parietal lobe with adjacent T2 signal hyperintensity. Petechial hemorrhage is demonstrated within each of the areas of  cortical enhancement. Foci are present within the right middle frontal gyrus, along the central sulcus and more inferiorly in the right temporal and parietal lobes. The diffusion-weighted images demonstrate some restricted diffusion within the areas of the right parietal lobe. The largest area, the right middle frontal gyrus lesion, demonstrates no definite restricted diffusion. Effacement of the sulci adjacent to the areas of enhancement is noted. No midline shift is present. The left hemisphere is unremarkable. ORBITS: No acute abnormality. SINUSES: No acute abnormality. BONES AND SOFT TISSUES: Normal bone marrow signal and enhancement. No acute soft tissue abnormality. IMPRESSION: 1. Multiple foci of T2 hyperintensity and cortical enhancement in the right MCA territory with associated petechial hemorrhage and some restricted diffusion, consistent with subacute infarcts. The lack of restricted diffusion and extensive enhancement suggests these infarctions are greater than 48 hours old. 2. More remote infarct and Encephalomalacia of the right parietal lobe. 3. No midline shift. Electronically signed by: Lonni Necessary MD 03/29/2024 04:40 AM EDT RP Workstation: [id number]   ECHOCARDIOGRAM LIMITED BUBBLE STUDY Result Date: 03/28/2024    ECHOCARDIOGRAM LIMITED REPORT   Patient Name:   Terri [name] Date of Exam: 03/28/2024 Medical Rec #:  [id number]           Height:       62.0 in Accession #:    [id number]          Weight:       137.0 lb Date of Birth:  Dec 29, 1975          BSA:          1.628 m Patient Age:    47 years            BP:           132/81 mmHg Patient Gender: F                   HR:           102 bpm. Exam Location:  Inpatient Procedure: Limited Echo (Both Spectral and Color Flow Doppler were utilized            during procedure). Indications:    patent foramen ovale  History:        Patient has prior history  of Echocardiogram examinations, most                 recent 03/28/2024. Risk  Factors:Current Smoker.  Sonographer:    Tinnie Barefoot RDCS Referring Phys: 28 Aquiles Ruffini  Conclusion(s)/Recommendation(s): Limited study to assess for intercardiac shunting. Limited visualization, but no large shunt seen. Consider TEE if further evaluation needed. FINDINGS  Left Ventricle: IAS/Shunts: Agitated saline contrast was given intravenously to evaluate for intracardiac shunting. Morene Brownie Electronically signed by Morene Brownie Signature Date/Time: 03/28/2024/4:45:10 PM    Final    CT HEAD WO CONTRAST ( ) Result Date: 03/28/2024 EXAM: CT HEAD WITHOUT 03/28/2024 12:50:40 PM TECHNIQUE: CT of the head was performed without the administration of intravenous contrast. Automated exposure control, iterative reconstruction, and/or weight based adjustment of the mA/kV was utilized to reduce the radiation dose to as low as reasonably achievable. COMPARISON: CT angiogram of the head and neck dated 03/28/2024 and CT of the head dated 03/28/2024. CLINICAL HISTORY: Stroke, follow up. FINDINGS: BRAIN AND VENTRICLES: No acute intracranial hemorrhage. No mass effect or midline shift. No extra-axial fluid collection. No hydrocephalus. Encephalomalacia changes again demonstrated within the right parietal lobe. There is increased attenuation within the cortex of the right frontal lobe and obscuration of the cerebral sulci suggesting contrast staining from either regional cerebritis or ischemic tissue. ORBITS: No acute abnormality. SINUSES AND MASTOIDS: No acute abnormality. SOFT TISSUES AND SKULL: No acute skull fracture. No acute soft tissue abnormality. IMPRESSION: 1. Encephalomalacia changes in the right parietal lobe. 2. Increased attenuation in the right frontal lobe cortex and obscuration of the cerebral sulci, suggestive of contrast staining from either regional cerebritis or ischemic tissue. Electronically signed by: evalene coho 03/28/2024 01:09 PM EDT RP Workstation: [id number]   EEG  adult Result Date: 03/28/2024 Gregg Lek, MD     03/28/2024 11:42 AM Patient Name: ANIELA CANIGLIA MRN: [id number] Epilepsy Attending: Lek Gregg Referring Physician/Provider: No ref. provider found     Date: 03/28/2024 Duration: 23 minutes Patient history:  48 y.o. 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 EEG to rule out subclinical seizures Level of alertness: Drowsy, sleep, lethargic AEDs during EEG study: Technical aspects: This EEG study was done with scalp electrodes positioned according to the 10-20 International system of electrode placement. Electrical activity was reviewed with band pass filter of 1-70Hz , sensitivity of 7 uV/mm, display speed of 109mm/sec with a 60Hz  notched filter applied as appropriate. EEG data were recorded continuously and digitally stored.  Video monitoring was available and reviewed as appropriate. Description: The posterior dominant rhythm consists of 12-13 Hz activity of moderate voltage (25-35 uV) seen predominantly in posterior head regions, symmetric and reactive to eye opening and eye closing. Drowsiness was characterized by attenuation of the posterior background rhythm. Sleep was characterized by vertex waves, sleep spindles (12 to 14 Hz), maximal frontocentral region.  There is an excessive amount of 15 to 18 Hz, 2-3 uV beta activity with irregular morphology distributed symmetrically and diffusely. Hyperventilation and photic stimulation were not performed.   ABNORMALITY - Excessive beta, generalized IMPRESSION: This study is within normal limits. No seizures or epileptiform discharges were seen throughout the recording. The excessive beta activity seen in the background is most likely due to the effect of benzodiazepine and is a benign EEG pattern. A normal interictal EEG does not exclude nor support the diagnosis of epilepsy. Lek Gregg  MD  Neurology    ECHOCARDIOGRAM COMPLETE Result Date: 03/28/2024    ECHOCARDIOGRAM REPORT   Patient Name:   MIRI JOSE Date of Exam: 03/28/2024 Medical Rec #:  [id number]           Height:       62.0 in Accession #:    [id number]          Weight:       137.0 lb Date of Birth:  February 15, 1976          BSA:          1.628 m Patient Age:    47 years            BP:           141/119 mmHg Patient Gender: F                   HR:           120 bpm. Exam Location:  Inpatient Procedure: 2D Echo, Cardiac Doppler and Color Doppler (Both Spectral and Color            Flow Doppler were utilized during procedure). Indications:    Stroke  History:        Patient has no prior history of Echocardiogram examinations. No                 relevant cardiac history.  Sonographer:    Therisa Crouch Referring Phys: [id number] MADISON KOMMOR  Sonographer Comments: Technically difficult study due to poor echo windows. IMPRESSIONS  1. Mild intercavitary gradient noted (<42mmHg) due to hyperdynamic and underfilled LV. Left ventricular ejection fraction, by estimation, is 70 to 75%. The left ventricle has hyperdynamic function. The left ventricle has no regional wall motion abnormalities. Indeterminate diastolic filling due to E-A fusion.  2. Right ventricular systolic function is normal. The right ventricular size is normal.  3. The mitral valve is normal in structure. No evidence of mitral valve regurgitation. No evidence of mitral stenosis.  4. The aortic valve is tricuspid. Aortic valve regurgitation is not visualized. Aortic valve sclerosis is present, with no evidence of aortic valve stenosis.  5. The inferior vena cava is normal in size with greater than 50% respiratory variability, suggesting right atrial pressure of 3 mmHg. FINDINGS  Left Ventricle: Mild intercavitary gradient noted (<46mmHg) due to hyperdynamic and underfilled LV. Left ventricular ejection fraction, by estimation, is 70 to 75%. The left ventricle has hyperdynamic  function. The left ventricle has no regional wall motion abnormalities. The left ventricular internal cavity size was normal in size. There is no left ventricular hypertrophy. Indeterminate diastolic filling due to E-A fusion. Right Ventricle: The right ventricular size is normal. No increase in right ventricular wall thickness. Right ventricular systolic function is normal. Left Atrium: Left atrial size was normal in size. Right Atrium: Right atrial size was normal in size. Pericardium: There is no evidence of pericardial effusion. Mitral Valve: The mitral valve is normal in structure. No evidence of mitral valve regurgitation. No evidence of mitral valve stenosis. Tricuspid Valve: The tricuspid valve is normal in structure. Tricuspid valve regurgitation is not demonstrated. No evidence of tricuspid stenosis. Aortic Valve: The aortic valve is tricuspid. Aortic valve regurgitation is not visualized. Aortic valve sclerosis is present, with no evidence of aortic valve stenosis. Aortic valve mean gradient measures 3.0 mmHg. Aortic valve peak gradient measures 6.1  mmHg. Aortic valve area, by VTI measures 1.84 cm. Pulmonic Valve: The pulmonic valve was normal in  structure. Pulmonic valve regurgitation is not visualized. No evidence of pulmonic stenosis. Aorta: The aortic root is normal in size and structure. Venous: The inferior vena cava is normal in size with greater than 50% respiratory variability, suggesting right atrial pressure of 3 mmHg. IAS/Shunts: The interatrial septum was not well visualized.  LEFT VENTRICLE PLAX 2D LVIDd:         4.40 cm LVIDs:         3.10 cm LV PW:         0.90 cm LV IVS:        1.00 cm LVOT diam:     1.80 cm LV SV:         35 LV SV Index:   21 LVOT Area:     2.54 cm  IVC IVC diam: 0.90 cm LEFT ATRIUM             Index LA diam:        2.70 cm 1.66 cm/m LA Vol (A2C):   21.5 ml 13.21 ml/m LA Vol (A4C):   27.7 ml 17.02 ml/m LA Biplane Vol: 26.2 ml 16.10 ml/m  AORTIC VALVE AV Area  (Vmax):    1.81 cm AV Area (Vmean):   1.88 cm AV Area (VTI):     1.84 cm AV Vmax:           123.00 cm/s AV Vmean:          86.900 cm/s AV VTI:            0.189 m AV Peak Grad:      6.1 mmHg AV Mean Grad:      3.0 mmHg LVOT Vmax:         87.50 cm/s LVOT Vmean:        64.200 cm/s LVOT VTI:          0.137 m LVOT/AV VTI ratio: 0.72  AORTA Ao Root diam: 3.00 cm Ao Asc diam:  2.60 cm  SHUNTS Systemic VTI:  0.14 m Systemic Diam: 1.80 cm Morene Brownie Electronically signed by Morene Brownie Signature Date/Time: 03/28/2024/8:49:23 AM    Final    CT VENOGRAM ABD/PELVIS/LOWER EXT BILAT Addendum Date: 03/28/2024 rigan 03/28/2024 07:45 AM EDT RP Workstation: [id number]   ITIES WITH CONTRAST 03/28/2024 07:09:15 AM TECHNIQUE: CTA images of the abdomen, pelvis and lower extremities with intravenous contrast. Three-dimensional MIP/volume rendered formations were performed. Automated exposure control, iterative reconstruction, and/or weight based adjustment of the mA/kV was utilized to reduce the radiation dose to as low as reasonably achievable. COMPARISON: None available. CLINICAL HISTORY: Deep venous thrombosis (DVT). FINDINGS: VASCULATURE: AORTA: No acute finding. No abdominal aortic aneurysm. No dissection. There is moderate calcific atheromatous disease within the aortoiliac arteries. CELIAC TRUNK: No acute finding. No occlusion or significant stenosis. SUPERIOR MESENTERIC ARTERY: No acute finding. No occlusion or significant stenosis. The mesenteric arteries are patent. INFERIOR MESENTERIC ARTERY: No acute finding. No occlusion or significant stenosis. RENAL ARTERIES: No acute finding. No occlusion or significant stenosis. The renal arteries are patent. RIGHT ILIAC ARTERIES: No acute finding. No occlusion or significant stenosis. RIGHT FEMORAL ARTERIES: No acute finding. No occlusion or significant stenosis. RIGHT POPLITEAL ARTERY: No acute finding. No occlusion or significant stenosis. RIGHT CALF ARTERIES: No acute  finding. No occlusion or significant stenosis. LEFT ILIAC ARTERIES: No acute finding. No occlusion or significant stenosis. LEFT FEMORAL ARTERIES: The left superficial femoral artery is completely occluded. LEFT POPLITEAL ARTERY: No acute finding. No occlusion or significant stenosis. LEFT CALF ARTERIES: No acute  finding. No occlusion or significant stenosis. ABDOMEN AND PELVIS: LOWER CHEST: There are ground-glass and streaky opacities present within the right lower lobe and ground-glass opacities present dependently within the left lower lobe. LIVER: The liver is unremarkable. GALLBLADDER AND BILE DUCTS: Gallbladder is unremarkable. No biliary ductal dilatation. SPLEEN: The spleen is unremarkable. PANCREAS: The pancreas is unremarkable. ADRENAL GLANDS: Bilateral adrenal glands demonstrate no acute abnormality. KIDNEYS, URETERS AND BLADDER: No stones in the kidneys or ureters. No hydronephrosis. No evidence of perinephric or periureteral stranding. Urinary bladder is unremarkable. There is left renal cortical atrophy and scarring. GI AND Bowel: Stomach and duodenal sweep demonstrate no acute abnormality. There is no bowel obstruction. No abnormal bowel wall thickening or distension. The patient is status post partial colectomy. Anastomosis sutures are noted in the right pelvis. REPRODUCTIVE: Reproductive organs are unremarkable. PERITONEUM AND RETRPERITONEUM: No ascites or free air. A periumbilical fat-containing hernia is present. LYMPH NODES: No evidence of lymphadenopathy. BONES AND SOFT TISSUES: No acute abnormality of the bones. No acute soft tissue abnormality. VASCULATURE: The inferior vena cava and iliac veins are patent as are the femoral veins. IMPRESSION: 1. Left superficial femoral artery occlusion. 2. 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 3. Ground-glass and streaky opacities in the right lower lobe and ground-glass opacities in the left lower lobe. 4. Left renal  cortical atrophy and scarring. 5. Periumbilical fat-containing hernia. 6. Status post partial colectomy with anastomosis sutures in the right pelvis. Electronically signed by: evalene coho 03/28/2024 07:33 AM EDT RP Workstation: [id number]   DG Chest Portable 1 View Result Date: 03/28/2024 EXAM: 1 VIEW XRAY OF THE CHEST 03/28/2024 07:22:00 AM COMPARISON: 07/06/2009 CLINICAL HISTORY: Concern for PNA. FINDINGS: LUNGS AND PLEURA: Vascular low lung volumes with some crowding of perihilar and bibasilar bronchovascular structures. No focal pulmonary opacity. No pulmonary edema. No pleural effusion. No pneumothorax. HEART AND MEDIASTINUM: No acute abnormality of the cardiac and mediastinal silhouettes. BONES AND SOFT TISSUES: No acute osseous abnormality. IMPRESSION: 1. No acute findings. 2. Low lung volumes with crowding of perihilar and bibasilar bronchovascular structures. Electronically signed by: Katheleen Faes MD 03/28/2024 07:27 AM EDT RP Workstation: [id number]   CT Angio Chest PE W and/or Wo Contrast Result Date: 03/28/2024 EXAM: CTA of the Chest with contrast for PE 03/28/2024 07:02:57 AM TECHNIQUE: CTA of the chest was performed after the administration of intravenous contrast. Multiplanar reformatted images are provided for review. MIP images are provided for review. Automated exposure control, iterative reconstruction, and/or weight based adjustment of the mA/kV was utilized to reduce the radiation dose to as low as reasonably achievable. COMPARISON: PA and lateral radiographs of chest dated 07/06/2009. CLINICAL HISTORY: Pulmonary embolism (PE) suspected, high prob. FINDINGS: PULMONARY ARTERIES: Pulmonary arteries are adequately opacified for evaluation. No pulmonary embolism. Main pulmonary artery is normal in caliber. MEDIASTINUM: The heart and pericardium demonstrate no acute abnormality. There is no acute abnormality of the thoracic aorta. LYMPH NODES: No mediastinal, hilar or axillary  lymphadenopathy. LUNGS AND PLEURA: There are hazy and streaky opacities present within the base of the right lower lobe, likely representing a combination of edema and atelectasis. There are also hazy opacities present in the left lung base. The nondependent lungs are clear. No pleural effusion or pneumothorax. UPPER ABDOMEN: Limited images of the upper abdomen are unremarkable. SOFT TISSUES AND BONES: No acute bone or soft tissue abnormality. IMPRESSION: 1. No evidence of pulmonary embolism. 2. Hazy and streaky opacities in the right lower lobe base, likely representing a combination of  edema and atelectasis. 3. Hazy opacities in the left lung base. Electronically signed by: evalene coho 03/28/2024 07:19 AM EDT RP Workstation: [id number]   CT ANGIO HEAD NECK W WO CM W PERF (CODE STROKE) Result Date: 03/28/2024 CLINICAL DATA:  48 year old female code stroke presentation. EXAM: CT ANGIOGRAPHY HEAD AND NECK CT PERFUSION BRAIN TECHNIQUE: Multidetector CT imaging of the head and neck was performed using the standard protocol during bolus administration of intravenous contrast. Multiplanar CT image reconstructions and MIPs were obtained to evaluate the vascular anatomy. Carotid stenosis measurements (when applicable) are obtained utilizing NASCET criteria, using the distal internal carotid diameter as the denominator. Multiphase CT imaging of the brain was performed following IV bolus contrast injection. Subsequent parametric perfusion maps were calculated using RAPID software. RADIATION DOSE REDUCTION: This exam was performed according to the departmental dose-optimization program which includes automated exposure control, adjustment of the mA and/or kV according to patient size and/or use of iterative reconstruction technique. CONTRAST:  OMNIPAQUE  IOHEXOL  350 MG/ML SOLN COMPARISON:  Plain head CT today 0550 hours. FINDINGS: CT Brain Perfusion Findings: ASPECTS: Unclear tumor versus infarct right middle  frontal gyrus. CBF (<30%) Volume: Zero, with no parameter abnormality detected. Perfusion (Tmax>6.0s) volume: Zero, with no parameter abnormality detected. Mismatch Volume: Not applicablemL Infarction Location:Not applicable CTA NECK Skeleton: Carious dentition. No acute or suspicious osseous lesion identified. Upper chest: Negative. Other neck: Nonvascular neck soft tissue spaces are within normal limits, no convincing neck mass or lymphadenopathy. Aortic arch: 4 vessel arch. Non dominant left vertebral artery arises directly from the arch. Mild calcified arch atherosclerosis is visible. Right carotid system: Streak artifact dense right subclavian venous contrast obscures the brachiocephalic artery and right CCA origin. Beginning in the right Common carotid artery at the level of the larynx there is abundant adherent low-density thrombus along the medial wall (series 12, image 110). Large volume of clot continues to the right carotid bifurcation, a distance of about 4 cm (series 14, image 52 with clot centered in the vessel on series 12, image 92. The thrombus however primarily extends into the right ECA at the bifurcation (series 12, image 88). Superimposed calcified posterior right ICA origin atherosclerosis. Right ICA is patent without stenosis to the skull base. Left carotid system: Negative left CCA origin. Positive for distal left CCA thrombus extending along a segment of about 2 cm beginning at the hypopharynx on series 2, image 97. This abates at the left carotid bifurcation. And left ICA origin is patent without stenosis. Vertebral arteries: Obscured proximal right subclavian artery. Normal right vertebral artery origin. Dominant right vertebral artery is patent and negative to the skull base. Non dominant left vertebral artery is diminutive and arises directly from the arch. Late entry into the left transverse foramen. Left vertebral remains highly diminutive but patent to the skull base. CTA HEAD  Posterior circulation: Diminutive left vertebral artery terminates in PICA. Right vertebral artery with patent PICA origin supplies the basilar. Both the right V4 segment and the basilar are diminutive but patent. No basilar stenosis identified. Fetal type PCA origins, especially the right. The basilar artery appears to terminates in patent SCA origins. Bilateral posterior communicating arteries and bilateral PCA branches are patent and within normal limits. Anterior circulation: Both ICA siphons are patent with no plaque or stenosis. The right siphon appears somewhat dominant. Both posterior communicating artery origins are normal. Patent carotid termini, MCA and ACA origins. Mildly dominant right ACA A1 segment. Diminutive or absent anterior communicating artery. Bilateral ACA branches are  within normal limits. Left MCA M1 segment and bifurcation appear patent without stenosis. No left MCA branch occlusion identified. Contralateral right MCA M1 segment and bifurcation also appear patent and relatively symmetrically enhancing (series 6, image 25). No right MCA branch occlusion is identified. And bilateral MCA enhancing branch density appears fairly symmetric. Venous sinuses: Early contrast timing, the superior sagittal sinus and dominant left transverse and sigmoid venous sinuses appear patent along with the left IJ bulb. Anatomic variants: Diminutive vertebrobasilar system with dominant right and diminutive left vertebral arteries, the left arises directly from the arch and terminates in PICA. Other findings: Delayed postcontrast images of the brain are provided and demonstrate confluent patchy gyriform type enhancement affecting much of the abnormal right middle frontal gyrus (series 10, image 25 and series 16, image 37) in an area of about 4.5 cm. This abnormal enhancement continues inferiorly to the right frontal operculum (series 10, image 19). And there is similar abnormal patchy in gyriform enhancement at  the posterior right temporal lobe, junction with the inferior right parietal lobe. Study discussed by telephone with Dr. Aisha Seals on 03/28/2024 at 06:22 . Review of the MIP images confirms the above findings IMPRESSION: 1. Constellation of acute thrombus in BOTH common carotid arteries (large volume on the Right), But no intracranial LVO. And multifocal abnormal enhancement in the right cerebral hemisphere - which is not entirely specific - but suggestive of enhancing subacute infarcts. Underlying chronic posterior right MCA territory infarct. And noncontrast Head CT suggesting petechial hemorrhage in the presume subacute right middle frontal gyrus area. 2. Differential diagnosis discussion by telephone with Dr. Aisha Seals including septic emboli (such as Endocarditis related), hypercoagulable state from malignancy, versus congenital or other benign acquired coagulopathy. Also, he advises history of DVT, so large paradoxical emboli such as from cardiac septal defect are a consideration. 3. Negative CTP. Negative visible nonvascular spaces of the neck and upper chest. Electronically Signed   By: VEAR Hurst M.D.   On: 03/28/2024 06:27   CT HEAD CODE STROKE WO CONTRAST Result Date: 03/28/2024 CLINICAL DATA:  Code stroke.  48 year old female.  Possible seizure. EXAM: CT HEAD WITHOUT CONTRAST TECHNIQUE: Contiguous axial images were obtained from the base of the skull through the vertex without intravenous contrast. RADIATION DOSE REDUCTION: This exam was performed according to the departmental dose-optimization program which includes automated exposure control, adjustment of the mA and/or kV according to patient size and/or use of iterative reconstruction technique. COMPARISON:  None Available. FINDINGS: Brain: Abnormal right frontal lobe, MCA territory. Posterior right MCA chronic appearing cortical encephalomalacia (series 2, image 22). Anterior to that abnormal mixed density obscuration of the normal  right middle frontal gyrus architecture in an area of about 4 cm, series 2, image 20 and coronal image 42. Gray and white matter affected, although vasogenic edema there is not excluded. No midline shift, ventriculomegaly, intracranial mass effect. Basilar cisterns are patent. Outside of those areas gray-white matter differentiation appears normal. Vascular: No suspicious intracranial vascular hyperdensity. Skull: Intact.  No acute osseous abnormality identified. Sinuses/Orbits: Visualized paranasal sinuses and mastoids are clear. Other: No gaze deviation. Visualized orbits and scalp soft tissues are within normal limits. ASPECTS [facility] Stroke Program Early CT Score) Total score (0-10 with 10 being normal): Unclear whether tumor versus heterogeneous infarct right middle frontal gyrus. IMPRESSION: Abnormal Right frontal lobe, with a combination of chronic posterior right MCA territory infarct (chronic encephalomalacia), but superimposed more anterior middle frontal gyrus 4 cm area of abnormal mixed density. And top  differential considerations are recurrent right MCA infarct with hyperdense petechial hemorrhage, versus tumor with vasogenic edema. CTA is pending and delayed postcontrast images have been requested on that exam. Otherwise brain MRI without and with contrast would best characterize further. Study discussed by telephone with Dr. Dr. Aisha Seals on 03/28/2024 at 05:57 . Electronically Signed   By: VEAR Hurst M.D.   On: 03/28/2024 06:00

## 2024-03-29 NOTE — Progress Notes (Signed)
   [facility] has been requested to perform a transesophageal echocardiogram on Terri [name] for CVA.  After careful review of history and examination, the risks and benefits of transesophageal echocardiogram have been explained including risks of esophageal damage, perforation (1:10,000 risk), bleeding, pharyngeal hematoma as well as other potential complications associated with conscious sedation including aspiration, arrhythmia, respiratory failure and death. Alternatives to treatment were discussed, questions were answered. Patient is willing to proceed.    Plan for 08/06. Thom LITTIE Sluder, PA-C  03/29/2024 2:10 PM

## 2024-03-30 ENCOUNTER — Inpatient Hospital Stay (HOSPITAL_COMMUNITY): Payer: Self-pay | Admitting: Anesthesiology

## 2024-03-30 ENCOUNTER — Encounter (HOSPITAL_COMMUNITY)
Admission: EM | Disposition: A | Discharge status: Rehabilitation Facility | Payer: Self-pay | Source: Home / Self Care | Attending: Internal Medicine

## 2024-03-30 ENCOUNTER — Encounter (HOSPITAL_COMMUNITY): Payer: Self-pay | Admitting: Internal Medicine

## 2024-03-30 ENCOUNTER — Inpatient Hospital Stay (HOSPITAL_COMMUNITY): Payer: Self-pay

## 2024-03-30 DIAGNOSIS — I639 Cerebral infarction, unspecified: Secondary | ICD-10-CM | POA: Diagnosis not present

## 2024-03-30 DIAGNOSIS — I749 Embolism and thrombosis of unspecified artery: Secondary | ICD-10-CM | POA: Insufficient documentation

## 2024-03-30 DIAGNOSIS — I63511 Cerebral infarction due to unspecified occlusion or stenosis of right middle cerebral artery: Secondary | ICD-10-CM

## 2024-03-30 DIAGNOSIS — E1165 Type 2 diabetes mellitus with hyperglycemia: Secondary | ICD-10-CM | POA: Insufficient documentation

## 2024-03-30 DIAGNOSIS — F32A Depression, unspecified: Secondary | ICD-10-CM

## 2024-03-30 DIAGNOSIS — I621 Nontraumatic extradural hemorrhage: Secondary | ICD-10-CM

## 2024-03-30 DIAGNOSIS — I63231 Cerebral infarction due to unspecified occlusion or stenosis of right carotid arteries: Secondary | ICD-10-CM

## 2024-03-30 DIAGNOSIS — I739 Peripheral vascular disease, unspecified: Secondary | ICD-10-CM

## 2024-03-30 DIAGNOSIS — F17200 Nicotine dependence, unspecified, uncomplicated: Secondary | ICD-10-CM | POA: Insufficient documentation

## 2024-03-30 DIAGNOSIS — F17210 Nicotine dependence, cigarettes, uncomplicated: Secondary | ICD-10-CM

## 2024-03-30 HISTORY — PX: TRANSESOPHAGEAL ECHOCARDIOGRAM (CATH LAB): EP1270

## 2024-03-30 LAB — CBC
HCT: 38.5 % (ref 36.0–46.0)
Hemoglobin: 12.9 g/dL (ref 12.0–15.0)
MCH: 29.1 pg (ref 26.0–34.0)
MCHC: 33.5 g/dL (ref 30.0–36.0)
MCV: 86.9 fL (ref 80.0–100.0)
Platelets: 324 K/uL (ref 150–400)
RBC: 4.43 MIL/uL (ref 3.87–5.11)
RDW: 13.1 % (ref 11.5–15.5)
WBC: 9.4 K/uL (ref 4.0–10.5)
nRBC: 0 % (ref 0.0–0.2)

## 2024-03-30 LAB — GLUCOSE, CAPILLARY
Glucose-Capillary: 172 mg/dL — ABNORMAL HIGH (ref 70–99)
Glucose-Capillary: 184 mg/dL — ABNORMAL HIGH (ref 70–99)
Glucose-Capillary: 201 mg/dL — ABNORMAL HIGH (ref 70–99)
Glucose-Capillary: 216 mg/dL — ABNORMAL HIGH (ref 70–99)
Glucose-Capillary: 222 mg/dL — ABNORMAL HIGH (ref 70–99)
Glucose-Capillary: 225 mg/dL — ABNORMAL HIGH (ref 70–99)

## 2024-03-30 LAB — HOMOCYSTEINE: Homocysteine: 8.2 umol/L (ref 0.0–14.5)

## 2024-03-30 LAB — COMPREHENSIVE METABOLIC PANEL WITH GFR
ALT: 12 U/L (ref 0–44)
AST: 17 U/L (ref 15–41)
Albumin: 2.7 g/dL — ABNORMAL LOW (ref 3.5–5.0)
Alkaline Phosphatase: 67 U/L (ref 38–126)
Anion gap: 8 (ref 5–15)
BUN: <5 mg/dL — ABNORMAL LOW (ref 6–20)
CO2: 21 mmol/L — ABNORMAL LOW (ref 22–32)
Calcium: 8.8 mg/dL — ABNORMAL LOW (ref 8.9–10.3)
Chloride: 108 mmol/L (ref 98–111)
Creatinine, Ser: 0.65 mg/dL (ref 0.44–1.00)
GFR, Estimated: >60 mL/min (ref >60)
Glucose, Bld: 212 mg/dL — ABNORMAL HIGH (ref 70–99)
Potassium: 4.2 mmol/L (ref 3.5–5.1)
Sodium: 137 mmol/L (ref 135–145)
Total Bilirubin: 0.6 mg/dL (ref 0.0–1.2)
Total Protein: 6.2 g/dL — ABNORMAL LOW (ref 6.5–8.1)

## 2024-03-30 LAB — BETA-2-GLYCOPROTEIN I ABS, IGG/M/A
Beta-2 Glyco I IgG: <9 GPI IgG units (ref 0–20)
Beta-2-Glycoprotein I IgA: <9 GPI IgA units (ref 0–25)
Beta-2-Glycoprotein I IgM: <9 GPI IgM units (ref 0–32)

## 2024-03-30 LAB — HEPARIN LEVEL (UNFRACTIONATED)
Heparin Unfractionated: 0.33 [IU]/mL (ref 0.30–0.70)
Heparin Unfractionated: 0.25 [IU]/mL — ABNORMAL LOW (ref 0.30–0.70)

## 2024-03-30 LAB — MAGNESIUM: Magnesium: 2 mg/dL (ref 1.7–2.4)

## 2024-03-30 LAB — PHOSPHORUS: Phosphorus: 4.3 mg/dL (ref 2.5–4.6)

## 2024-03-30 LAB — ECHO TEE

## 2024-03-30 LAB — RHEUMATOID FACTOR: Rheumatoid fact SerPl-aCnc: 10.2 [IU]/mL (ref <14.0)

## 2024-03-30 LAB — PROTEIN S, TOTAL: Protein S Ag, Total: 163 % — ABNORMAL HIGH (ref 60–150)

## 2024-03-30 LAB — PROTEIN C ACTIVITY: Protein C Activity: 167 % (ref 73–180)

## 2024-03-30 LAB — PROTEIN S ACTIVITY: Protein S Activity: 98 % (ref 63–140)

## 2024-03-30 SURGERY — TRANSESOPHAGEAL ECHOCARDIOGRAM (TEE) (CATHLAB)
Anesthesia: Monitor Anesthesia Care

## 2024-03-30 MED ORDER — INSULIN ASPART 100 UNIT/ML IJ SOLN
3.0000 [IU] | Freq: Three times a day (TID) | INTRAMUSCULAR | Status: DC
  Administered 2024-03-30 – 2024-03-31 (×3): 3 [IU] via SUBCUTANEOUS

## 2024-03-30 MED ORDER — PROPOFOL 10 MG/ML IV BOLUS
INTRAVENOUS | Status: DC | PRN
  Administered 2024-03-30 (×2): 20 mg via INTRAVENOUS
  Administered 2024-03-30 (×2): 10 mg via INTRAVENOUS
  Administered 2024-03-30: 20 mg via INTRAVENOUS
  Administered 2024-03-30 (×2): 10 mg via INTRAVENOUS

## 2024-03-30 MED ORDER — LIDOCAINE 2% (20 MG/ML) 5 ML SYRINGE
INTRAMUSCULAR | Status: DC | PRN
  Administered 2024-03-30: 60 mg via INTRAVENOUS
  Administered 2024-03-30: 40 mg via INTRAVENOUS

## 2024-03-30 MED ORDER — APIXABAN 5 MG PO TABS
5.0000 mg | ORAL_TABLET | Freq: Two times a day (BID) | ORAL | Status: DC
  Administered 2024-03-30 – 2024-03-31 (×2): 5 mg via ORAL
  Filled 2024-03-30 (×2): qty 1

## 2024-03-30 MED ORDER — INSULIN GLARGINE-YFGN 100 UNIT/ML ~~LOC~~ SOLN
12.0000 [IU] | Freq: Two times a day (BID) | SUBCUTANEOUS | Status: DC
  Administered 2024-03-30 – 2024-03-31 (×2): 12 [IU] via SUBCUTANEOUS
  Filled 2024-03-30 (×3): qty 0.12

## 2024-03-30 MED ORDER — PROPOFOL 500 MG/50ML IV EMUL
INTRAVENOUS | Status: DC | PRN
  Administered 2024-03-30: 150 ug/kg/min via INTRAVENOUS

## 2024-03-30 NOTE — Progress Notes (Signed)
 Venous duplex lower ext  has been completed. Refer to [facility] under chart review to view preliminary results.   03/30/2024  1:00 PM Qaadir Kent, Ricka BIRCH

## 2024-03-30 NOTE — Anesthesia Postprocedure Evaluation (Signed)
 Anesthesia Post Note  Patient: Terri [name]  Procedure(s) Performed: TRANSESOPHAGEAL ECHOCARDIOGRAM     Patient location during evaluation: Cath Lab Anesthesia Type: MAC Level of consciousness: awake and alert Pain management: pain level controlled Vital Signs Assessment: post-procedure vital signs reviewed and stable Respiratory status: spontaneous breathing, nonlabored ventilation, respiratory function stable and patient connected to nasal cannula oxygen Cardiovascular status: stable and blood pressure returned to baseline Postop Assessment: no apparent nausea or vomiting Anesthetic complications: no   No notable events documented.  Last Vitals:  Vitals:   03/30/24 0950 03/30/24 0955  BP: 117/88   Pulse: 97 (!) 102  Resp: (!) 26 (!) 23  Temp:    SpO2: 96% 98%    Last Pain:  Vitals:   03/30/24 0955  TempSrc:   PainSc: 0-No pain                 Jerris Fleer L Emslee Lopezmartinez

## 2024-03-30 NOTE — Progress Notes (Signed)
 STROKE TEAM PROGRESS NOTE   SUBJECTIVE (INTERVAL HISTORY) Her mom is at the bedside.  Pt reclining in bed, no acute event overnight.  Had TEE today showed no LV thrombus or cardioembolic source.  Switch heparin  IV to Eliquis .  OBJECTIVE Temp:  [97.8 F (36.6 C)-99.1 F (37.3 C)] 98.1 F (36.7 C) (08/06 1608) Pulse Rate:  [87-102] 90 (08/06 1608) Cardiac Rhythm: Normal sinus rhythm (08/06 0955) Resp:  [15-26] 16 (08/06 1608) BP: (100-131)/(77-93) 111/81 (08/06 1608) SpO2:  [95 %-100 %] 100 % (08/06 1608)  Recent Labs  Lab 03/30/24 0005 03/30/24 0413 03/30/24 0947 03/30/24 1059 03/30/24 1605  GLUCAP 222* 184* 216* 201* 225*   Recent Labs  Lab 03/28/24 0524 03/28/24 0546 03/28/24 0548 03/29/24 0718 03/30/24 0453  NA 136 138 137 136 137  K 3.5 3.7 3.7 3.2* 4.2  CL 99 98  --  104 108  CO2 24  --   --  24 21*  GLUCOSE 504* 500*  --  238* 212*  BUN 6 7  --  <5* <5*  CREATININE 0.95 0.80  --  0.49 0.65  CALCIUM  9.3  --   --  8.8* 8.8*  MG  --   --   --  1.7 2.0  PHOS  --   --   --  4.4 4.3   Recent Labs  Lab 03/28/24 0524 03/30/24 0453  AST 22 17  ALT 10 12  ALKPHOS 93 67  BILITOT 0.7 0.6  PROT 6.8 6.2*  ALBUMIN 3.2* 2.7*   Recent Labs  Lab 03/28/24 0538 03/28/24 0546 03/28/24 0548 03/29/24 0718 03/30/24 0453  WBC 14.0*  --   --  10.4 9.4  NEUTROABS 9.6*  --   --   --   --   HGB 13.7 16.0* 15.3* 13.0 12.9  HCT 40.9 47.0* 45.0 38.0 38.5  MCV 88.1  --   --  86.0 86.9  PLT 341  --   --  335 324   No results for input(s): CKTOTAL, CKMB, CKMBINDEX, TROPONINI in the last 168 hours. Recent Labs    03/28/24 0538  LABPROT 13.0  INR 0.9   Recent Labs    03/28/24 0542  COLORURINE STRAW*  LABSPEC 1.031*  PHURINE 7.0  GLUCOSEU >=500*  HGBUR MODERATE*  BILIRUBINUR NEGATIVE  KETONESUR NEGATIVE  PROTEINUR 30*  NITRITE NEGATIVE  LEUKOCYTESUR NEGATIVE       Component Value Date/Time   CHOL 233 (H) 03/29/2024 0718   TRIG 425 (H) 03/29/2024 0718    HDL 38 (L) 03/29/2024 0718   CHOLHDL 6.1 03/29/2024 0718   VLDL UNABLE TO CALCULATE IF TRIGLYCERIDE OVER 400 mg/dL 91/94/7974 9281   LDLCALC UNABLE TO CALCULATE IF TRIGLYCERIDE OVER 400 mg/dL 91/94/7974 9281   Lab Results  Component Value Date   HGBA1C 13.7 (H) 03/29/2024      Component Value Date/Time   LABOPIA NONE DETECTED 03/28/2024 0542   COCAINSCRNUR NONE DETECTED 03/28/2024 0542   LABBENZ NONE DETECTED 03/28/2024 0542   AMPHETMU NONE DETECTED 03/28/2024 0542   THCU POSITIVE (A) 03/28/2024 0542   LABBARB NONE DETECTED 03/28/2024 0542    Recent Labs  Lab 03/28/24 0524  ETH <15    I have personally reviewed the radiological images below and agree with the radiology interpretations.  ECHOCARDIOGRAM LIMITED BUBBLE STUDY Result Date: 03/28/2024    ECHOCARDIOGRAM LIMITED REPORT   Patient Name:   LENNAN MALONE Date of Exam: 03/28/2024 Medical Rec #:  [id number]  Height:       62.0 in Accession #:    [id number]          Weight:       137.0 lb Date of Birth:  1975/08/28          BSA:          1.628 m Patient Age:    47 years            BP:           132/81 mmHg Patient Gender: F                   HR:           102 bpm. Exam Location:  Inpatient Procedure: Limited Echo (Both Spectral and Color Flow Doppler were utilized            during procedure). Indications:    patent foramen ovale  History:        Patient has prior history of Echocardiogram examinations, most                 recent 03/28/2024. Risk Factors:Current Smoker.  Sonographer:    Tinnie Barefoot RDCS Referring Phys: 21 MURALI RAMASWAMY  Conclusion(s)/Recommendation(s): Limited study to assess for intercardiac shunting. Limited visualization, but no large shunt seen. Consider TEE if further evaluation needed. FINDINGS  Left Ventricle: IAS/Shunts: Agitated saline contrast was given intravenously to evaluate for intracardiac shunting. Morene Brownie Electronically signed by Morene Brownie Signature Date/Time:  03/28/2024/4:45:10 PM    Final    CT HEAD WO CONTRAST ( ) Result Date: 03/28/2024 EXAM: CT HEAD WITHOUT 03/28/2024 12:50:40 PM TECHNIQUE: CT of the head was performed without the administration of intravenous contrast. Automated exposure control, iterative reconstruction, and/or weight based adjustment of the mA/kV was utilized to reduce the radiation dose to as low as reasonably achievable. COMPARISON: CT angiogram of the head and neck dated 03/28/2024 and CT of the head dated 03/28/2024. CLINICAL HISTORY: Stroke, follow up. FINDINGS: BRAIN AND VENTRICLES: No acute intracranial hemorrhage. No mass effect or midline shift. No extra-axial fluid collection. No hydrocephalus. Encephalomalacia changes again demonstrated within the right parietal lobe. There is increased attenuation within the cortex of the right frontal lobe and obscuration of the cerebral sulci suggesting contrast staining from either regional cerebritis or ischemic tissue. ORBITS: No acute abnormality. SINUSES AND MASTOIDS: No acute abnormality. SOFT TISSUES AND SKULL: No acute skull fracture. No acute soft tissue abnormality. IMPRESSION: 1. Encephalomalacia changes in the right parietal lobe. 2. Increased attenuation in the right frontal lobe cortex and obscuration of the cerebral sulci, suggestive of contrast staining from either regional cerebritis or ischemic tissue. Electronically signed by: evalene coho 03/28/2024 01:09 PM EDT RP Workstation: [id number]   EEG adult Result Date: 03/28/2024 Gregg Lek, MD     03/28/2024 11:42 AM Patient Name: BRIHANY BUTCH MRN: [id number] Epilepsy Attending: Lek Gregg Referring Physician/Provider: No ref. provider found     Date: 03/28/2024 Duration: 23 minutes Patient history:  48 y.o. 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 EEG to rule out subclinical seizures Level of  alertness: Drowsy, sleep, lethargic AEDs during EEG study: Technical aspects: This EEG study was done with scalp electrodes positioned according to the 10-20 International system of electrode placement. Electrical activity was reviewed with band pass filter of 1-70Hz , sensitivity of 7 uV/mm, display speed of 84mm/sec with  a 60Hz  notched filter applied as appropriate. EEG data were recorded continuously and digitally stored.  Video monitoring was available and reviewed as appropriate. Description: The posterior dominant rhythm consists of 12-13 Hz activity of moderate voltage (25-35 uV) seen predominantly in posterior head regions, symmetric and reactive to eye opening and eye closing. Drowsiness was characterized by attenuation of the posterior background rhythm. Sleep was characterized by vertex waves, sleep spindles (12 to 14 Hz), maximal frontocentral region.  There is an excessive amount of 15 to 18 Hz, 2-3 uV beta activity with irregular morphology distributed symmetrically and diffusely. Hyperventilation and photic stimulation were not performed.   ABNORMALITY - Excessive beta, generalized IMPRESSION: This study is within normal limits. No seizures or epileptiform discharges were seen throughout the recording. The excessive beta activity seen in the background is most likely due to the effect of benzodiazepine and is a benign EEG pattern. A normal interictal EEG does not exclude nor support the diagnosis of epilepsy. Pastor Falling MD Neurology    ECHOCARDIOGRAM COMPLETE Result Date: 03/28/2024    ECHOCARDIOGRAM REPORT   Patient Name:   NONNIE PICKNEY Date of Exam: 03/28/2024 Medical Rec #:  [id number]           Height:       62.0 in Accession #:    [id number]          Weight:       137.0 lb Date of Birth:  [birth year]          BSA:          1.628 m Patient Age:    47 years            BP:           141/119 mmHg Patient Gender: F                   HR:           120 bpm. Exam Location:  Inpatient Procedure:  2D Echo, Cardiac Doppler and Color Doppler (Both Spectral and Color            Flow Doppler were utilized during procedure). Indications:    Stroke  History:        Patient has no prior history of Echocardiogram examinations. No                 relevant cardiac history.  Sonographer:    Therisa Crouch Referring Phys: [id number] MADISON KOMMOR  Sonographer Comments: Technically difficult study due to poor echo windows. IMPRESSIONS  1. Mild intercavitary gradient noted (<14mmHg) due to hyperdynamic and underfilled LV. Left ventricular ejection fraction, by estimation, is 70 to 75%. The left ventricle has hyperdynamic function. The left ventricle has no regional wall motion abnormalities. Indeterminate diastolic filling due to E-A fusion.  2. Right ventricular systolic function is normal. The right ventricular size is normal.  3. The mitral valve is normal in structure. No evidence of mitral valve regurgitation. No evidence of mitral stenosis.  4. The aortic valve is tricuspid. Aortic valve regurgitation is not visualized. Aortic valve sclerosis is present, with no evidence of aortic valve stenosis.  5. The inferior vena cava is normal in size with greater than 50% respiratory variability, suggesting right atrial pressure of 3 mmHg. FINDINGS  Left Ventricle: Mild intercavitary gradient noted (<59mmHg) due to hyperdynamic and underfilled LV. Left ventricular ejection fraction, by estimation, is 70 to 75%. The left ventricle has hyperdynamic function. The left ventricle has no  regional wall motion abnormalities. The left ventricular internal cavity size was normal in size. There is no left ventricular hypertrophy. Indeterminate diastolic filling due to E-A fusion. Right Ventricle: The right ventricular size is normal. No increase in right ventricular wall thickness. Right ventricular systolic function is normal. Left Atrium: Left atrial size was normal in size. Right Atrium: Right atrial size was normal in size. Pericardium:  There is no evidence of pericardial effusion. Mitral Valve: The mitral valve is normal in structure. No evidence of mitral valve regurgitation. No evidence of mitral valve stenosis. Tricuspid Valve: The tricuspid valve is normal in structure. Tricuspid valve regurgitation is not demonstrated. No evidence of tricuspid stenosis. Aortic Valve: The aortic valve is tricuspid. Aortic valve regurgitation is not visualized. Aortic valve sclerosis is present, with no evidence of aortic valve stenosis. Aortic valve mean gradient measures 3.0 mmHg. Aortic valve peak gradient measures 6.1  mmHg. Aortic valve area, by VTI measures 1.84 cm. Pulmonic Valve: The pulmonic valve was normal in structure. Pulmonic valve regurgitation is not visualized. No evidence of pulmonic stenosis. Aorta: The aortic root is normal in size and structure. Venous: The inferior vena cava is normal in size with greater than 50% respiratory variability, suggesting right atrial pressure of 3 mmHg. IAS/Shunts: The interatrial septum was not well visualized.  LEFT VENTRICLE PLAX 2D LVIDd:         4.40 cm LVIDs:         3.10 cm LV PW:         0.90 cm LV IVS:        1.00 cm LVOT diam:     1.80 cm LV SV:         35 LV SV Index:   21 LVOT Area:     2.54 cm  IVC IVC diam: 0.90 cm LEFT ATRIUM             Index LA diam:        2.70 cm 1.66 cm/m LA Vol (A2C):   21.5 ml 13.21 ml/m LA Vol (A4C):   27.7 ml 17.02 ml/m LA Biplane Vol: 26.2 ml 16.10 ml/m  AORTIC VALVE AV Area (Vmax):    1.81 cm AV Area (Vmean):   1.88 cm AV Area (VTI):     1.84 cm AV Vmax:           123.00 cm/s AV Vmean:          86.900 cm/s AV VTI:            0.189 m AV Peak Grad:      6.1 mmHg AV Mean Grad:      3.0 mmHg LVOT Vmax:         87.50 cm/s LVOT Vmean:        64.200 cm/s LVOT VTI:          0.137 m LVOT/AV VTI ratio: 0.72  AORTA Ao Root diam: 3.00 cm Ao Asc diam:  2.60 cm  SHUNTS Systemic VTI:  0.14 m Systemic Diam: 1.80 cm Morene Brownie Electronically signed by Morene Brownie  Signature Date/Time: 03/28/2024/8:49:23 AM    Final    CT VENOGRAM ABD/PELVIS/LOWER EXT BILAT Addendum Date: 03/28/2024 ADDENDUM #1 ADDENDUM: The above findings were discussed with Dr. Simon at 07:41 am 03/28/2024. ---------------------------------------------------- Electronically signed by: evalene berrigan 03/28/2024 07:45 AM EDT RP Workstation: [id number]   Result Date: 03/28/2024 ORIGINAL REPORT  EXAM: CTA ABDOMEN AND PELVIS WITH CONTRAST AND RUNOFF CTA OF THE LOWER EXTREMITIES WITH CONTRAST 03/28/2024 07:09:15  AM TECHNIQUE: CTA images of the abdomen, pelvis and lower extremities with intravenous contrast. Three-dimensional MIP/volume rendered formations were performed. Automated exposure control, iterative reconstruction, and/or weight based adjustment of the mA/kV was utilized to reduce the radiation dose to as low as reasonably achievable. COMPARISON: None available. CLINICAL HISTORY: Deep venous thrombosis (DVT). FINDINGS: VASCULATURE: AORTA: No acute finding. No abdominal aortic aneurysm. No dissection. There is moderate calcific atheromatous disease within the aortoiliac arteries. CELIAC TRUNK: No acute finding. No occlusion or significant stenosis. SUPERIOR MESENTERIC ARTERY: No acute finding. No occlusion or significant stenosis. The mesenteric arteries are patent. INFERIOR MESENTERIC ARTERY: No acute finding. No occlusion or significant stenosis. RENAL ARTERIES: No acute finding. No occlusion or significant stenosis. The renal arteries are patent. RIGHT ILIAC ARTERIES: No acute finding. No occlusion or significant stenosis. RIGHT FEMORAL ARTERIES: No acute finding. No occlusion or significant stenosis. RIGHT POPLITEAL ARTERY: No acute finding. No occlusion or significant stenosis. RIGHT CALF ARTERIES: No acute finding. No occlusion or significant stenosis. LEFT ILIAC ARTERIES: No acute finding. No occlusion or significant stenosis. LEFT FEMORAL ARTERIES: The left superficial femoral artery is  completely occluded. LEFT POPLITEAL ARTERY: No acute finding. No occlusion or significant stenosis. LEFT CALF ARTERIES: No acute finding. No occlusion or significant stenosis. ABDOMEN AND PELVIS: LOWER CHEST: There are ground-glass and streaky opacities present within the right lower lobe and ground-glass opacities present dependently within the left lower lobe. LIVER: The liver is unremarkable. GALLBLADDER AND BILE DUCTS: Gallbladder is unremarkable. No biliary ductal dilatation. SPLEEN: The spleen is unremarkable. PANCREAS: The pancreas is unremarkable. ADRENAL GLANDS: Bilateral adrenal glands demonstrate no acute abnormality. KIDNEYS, URETERS AND BLADDER: No stones in the kidneys or ureters. No hydronephrosis. No evidence of perinephric or periureteral stranding. Urinary bladder is unremarkable. There is left renal cortical atrophy and scarring. GI AND Bowel: Stomach and duodenal sweep demonstrate no acute abnormality. There is no bowel obstruction. No abnormal bowel wall thickening or distension. The patient is status post partial colectomy. Anastomosis sutures are noted in the right pelvis. REPRODUCTIVE: Reproductive organs are unremarkable. PERITONEUM AND RETRPERITONEUM: No ascites or free air. A periumbilical fat-containing hernia is present. LYMPH NODES: No evidence of lymphadenopathy. BONES AND SOFT TISSUES: No acute abnormality of the bones. No acute soft tissue abnormality. VASCULATURE: The inferior vena cava and iliac veins are patent as are the femoral veins. IMPRESSION: 1. Left superficial femoral artery occlusion. 2. 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 3. Ground-glass and streaky opacities in the right lower lobe and ground-glass opacities in the left lower lobe. 4. Left renal cortical atrophy and scarring. 5. Periumbilical fat-containing hernia. 6. Status post partial colectomy with anastomosis sutures in the right pelvis. Electronically signed by: evalene coho  03/28/2024 07:33 AM EDT RP Workstation: [id number]   DG Chest Portable 1 View Result Date: 03/28/2024 EXAM: 1 VIEW XRAY OF THE CHEST 03/28/2024 07:22:00 AM COMPARISON: 07/06/2009 CLINICAL HISTORY: Concern for PNA. FINDINGS: LUNGS AND PLEURA: Vascular low lung volumes with some crowding of perihilar and bibasilar bronchovascular structures. No focal pulmonary opacity. No pulmonary edema. No pleural effusion. No pneumothorax. HEART AND MEDIASTINUM: No acute abnormality of the cardiac and mediastinal silhouettes. BONES AND SOFT TISSUES: No acute osseous abnormality. IMPRESSION: 1. No acute findings. 2. Low lung volumes with crowding of perihilar and bibasilar bronchovascular structures. Electronically signed by: Katheleen Faes MD 03/28/2024 07:27 AM EDT RP Workstation: [id number]   CT Angio Chest PE W and/or Wo Contrast Result Date: 03/28/2024 EXAM: CTA of the Chest with contrast  for PE 03/28/2024 07:02:57 AM TECHNIQUE: CTA of the chest was performed after the administration of intravenous contrast. Multiplanar reformatted images are provided for review. MIP images are provided for review. Automated exposure control, iterative reconstruction, and/or weight based adjustment of the mA/kV was utilized to reduce the radiation dose to as low as reasonably achievable. COMPARISON: PA and lateral radiographs of chest dated 07/06/2009. CLINICAL HISTORY: Pulmonary embolism (PE) suspected, high prob. FINDINGS: PULMONARY ARTERIES: Pulmonary arteries are adequately opacified for evaluation. No pulmonary embolism. Main pulmonary artery is normal in caliber. MEDIASTINUM: The heart and pericardium demonstrate no acute abnormality. There is no acute abnormality of the thoracic aorta. LYMPH NODES: No mediastinal, hilar or axillary lymphadenopathy. LUNGS AND PLEURA: There are hazy and streaky opacities present within the base of the right lower lobe, likely representing a combination of edema and atelectasis. There are also hazy  opacities present in the left lung base. The nondependent lungs are clear. No pleural effusion or pneumothorax. UPPER ABDOMEN: Limited images of the upper abdomen are unremarkable. SOFT TISSUES AND BONES: No acute bone or soft tissue abnormality. IMPRESSION: 1. No evidence of pulmonary embolism. 2. Hazy and streaky opacities in the right lower lobe base, likely representing a combination of edema and atelectasis. 3. Hazy opacities in the left lung base. Electronically signed by: evalene coho 03/28/2024 07:19 AM EDT RP Workstation: [id number]   CT ANGIO HEAD NECK W WO CM W PERF (CODE STROKE) Result Date: 03/28/2024 CLINICAL DATA:  48 year old female code stroke presentation. EXAM: CT ANGIOGRAPHY HEAD AND NECK CT PERFUSION BRAIN TECHNIQUE: Multidetector CT imaging of the head and neck was performed using the standard protocol during bolus administration of intravenous contrast. Multiplanar CT image reconstructions and MIPs were obtained to evaluate the vascular anatomy. Carotid stenosis measurements (when applicable) are obtained utilizing NASCET criteria, using the distal internal carotid diameter as the denominator. Multiphase CT imaging of the brain was performed following IV bolus contrast injection. Subsequent parametric perfusion maps were calculated using RAPID software. RADIATION DOSE REDUCTION: This exam was performed according to the departmental dose-optimization program which includes automated exposure control, adjustment of the mA and/or kV according to patient size and/or use of iterative reconstruction technique. CONTRAST:  OMNIPAQUE  IOHEXOL  350 MG/ML SOLN COMPARISON:  Plain head CT today 0550 hours. FINDINGS: CT Brain Perfusion Findings: ASPECTS: Unclear tumor versus infarct right middle frontal gyrus. CBF (<30%) Volume: Zero, with no parameter abnormality detected. Perfusion (Tmax>6.0s) volume: Zero, with no parameter abnormality detected. Mismatch Volume: Not applicablemL Infarction  Location:Not applicable CTA NECK Skeleton: Carious dentition. No acute or suspicious osseous lesion identified. Upper chest: Negative. Other neck: Nonvascular neck soft tissue spaces are within normal limits, no convincing neck mass or lymphadenopathy. Aortic arch: 4 vessel arch. Non dominant left vertebral artery arises directly from the arch. Mild calcified arch atherosclerosis is visible. Right carotid system: Streak artifact dense right subclavian venous contrast obscures the brachiocephalic artery and right CCA origin. Beginning in the right Common carotid artery at the level of the larynx there is abundant adherent low-density thrombus along the medial wall (series 12, image 110). Large volume of clot continues to the right carotid bifurcation, a distance of about 4 cm (series 14, image 52 with clot centered in the vessel on series 12, image 92. The thrombus however primarily extends into the right ECA at the bifurcation (series 12, image 88). Superimposed calcified posterior right ICA origin atherosclerosis. Right ICA is patent without stenosis to the skull base. Left carotid system: Negative left CCA  origin. Positive for distal left CCA thrombus extending along a segment of about 2 cm beginning at the hypopharynx on series 2, image 97. This abates at the left carotid bifurcation. And left ICA origin is patent without stenosis. Vertebral arteries: Obscured proximal right subclavian artery. Normal right vertebral artery origin. Dominant right vertebral artery is patent and negative to the skull base. Non dominant left vertebral artery is diminutive and arises directly from the arch. Late entry into the left transverse foramen. Left vertebral remains highly diminutive but patent to the skull base. CTA HEAD Posterior circulation: Diminutive left vertebral artery terminates in PICA. Right vertebral artery with patent PICA origin supplies the basilar. Both the right V4 segment and the basilar are diminutive but  patent. No basilar stenosis identified. Fetal type PCA origins, especially the right. The basilar artery appears to terminates in patent SCA origins. Bilateral posterior communicating arteries and bilateral PCA branches are patent and within normal limits. Anterior circulation: Both ICA siphons are patent with no plaque or stenosis. The right siphon appears somewhat dominant. Both posterior communicating artery origins are normal. Patent carotid termini, MCA and ACA origins. Mildly dominant right ACA A1 segment. Diminutive or absent anterior communicating artery. Bilateral ACA branches are within normal limits. Left MCA M1 segment and bifurcation appear patent without stenosis. No left MCA branch occlusion identified. Contralateral right MCA M1 segment and bifurcation also appear patent and relatively symmetrically enhancing (series 6, image 25). No right MCA branch occlusion is identified. And bilateral MCA enhancing branch density appears fairly symmetric. Venous sinuses: Early contrast timing, the superior sagittal sinus and dominant left transverse and sigmoid venous sinuses appear patent along with the left IJ bulb. Anatomic variants: Diminutive vertebrobasilar system with dominant right and diminutive left vertebral arteries, the left arises directly from the arch and terminates in PICA. Other findings: Delayed postcontrast images of the brain are provided and demonstrate confluent patchy gyriform type enhancement affecting much of the abnormal right middle frontal gyrus (series 10, image 25 and series 16, image 37) in an area of about 4.5 cm. This abnormal enhancement continues inferiorly to the right frontal operculum (series 10, image 19). And there is similar abnormal patchy in gyriform enhancement at the posterior right temporal lobe, junction with the inferior right parietal lobe. Study discussed by telephone with Dr. Aisha Seals on 03/28/2024 at 06:22 . Review of the MIP images confirms the above  findings IMPRESSION: 1. Constellation of acute thrombus in BOTH common carotid arteries (large volume on the Right), But no intracranial LVO. And multifocal abnormal enhancement in the right cerebral hemisphere - which is not entirely specific - but suggestive of enhancing subacute infarcts. Underlying chronic posterior right MCA territory infarct. And noncontrast Head CT suggesting petechial hemorrhage in the presume subacute right middle frontal gyrus area. 2. Differential diagnosis discussion by telephone with Dr. Aisha Seals including septic emboli (such as Endocarditis related), hypercoagulable state from malignancy, versus congenital or other benign acquired coagulopathy. Also, he advises history of DVT, so large paradoxical emboli such as from cardiac septal defect are a consideration. 3. Negative CTP. Negative visible nonvascular spaces of the neck and upper chest. Electronically Signed   By: VEAR Hurst M.D.   On: 03/28/2024 06:27   CT HEAD CODE STROKE WO CONTRAST Result Date: 03/28/2024 CLINICAL DATA:  Code stroke.  48 year old female.  Possible seizure. EXAM: CT HEAD WITHOUT CONTRAST TECHNIQUE: Contiguous axial images were obtained from the base of the skull through the vertex without intravenous contrast. RADIATION DOSE  REDUCTION: This exam was performed according to the departmental dose-optimization program which includes automated exposure control, adjustment of the mA and/or kV according to patient size and/or use of iterative reconstruction technique. COMPARISON:  None Available. FINDINGS: Brain: Abnormal right frontal lobe, MCA territory. Posterior right MCA chronic appearing cortical encephalomalacia (series 2, image 22). Anterior to that abnormal mixed density obscuration of the normal right middle frontal gyrus architecture in an area of about 4 cm, series 2, image 20 and coronal image 42. Gray and white matter affected, although vasogenic edema there is not excluded. No midline shift,  ventriculomegaly, intracranial mass effect. Basilar cisterns are patent. Outside of those areas gray-white matter differentiation appears normal. Vascular: No suspicious intracranial vascular hyperdensity. Skull: Intact.  No acute osseous abnormality identified. Sinuses/Orbits: Visualized paranasal sinuses and mastoids are clear. Other: No gaze deviation. Visualized orbits and scalp soft tissues are within normal limits. ASPECTS [facility] Stroke Program Early CT Score) Total score (0-10 with 10 being normal): Unclear whether tumor versus heterogeneous infarct right middle frontal gyrus. IMPRESSION: Abnormal Right frontal lobe, with a combination of chronic posterior right MCA territory infarct (chronic encephalomalacia), but superimposed more anterior middle frontal gyrus 4 cm area of abnormal mixed density. And top differential considerations are recurrent right MCA infarct with hyperdense petechial hemorrhage, versus tumor with vasogenic edema. CTA is pending and delayed postcontrast images have been requested on that exam. Otherwise brain MRI without and with contrast would best characterize further. Study discussed by telephone with Dr. Dr. Aisha Seals on 03/28/2024 at 05:57 . Electronically Signed   By: VEAR Hurst M.D.   On: 03/28/2024 06:00     PHYSICAL EXAM  Temp:  [97.8 F (36.6 C)-99.1 F (37.3 C)] 98.1 F (36.7 C) (08/06 1608) Pulse Rate:  [87-102] 90 (08/06 1608) Resp:  [15-26] 16 (08/06 1608) BP: (100-131)/(77-93) 111/81 (08/06 1608) SpO2:  [95 %-100 %] 100 % (08/06 1608)  General - Well nourished, well developed, in no apparent distress.  Ophthalmologic - fundi not visualized due to noncooperation.  Cardiovascular - Regular rhythm and rate.  Mental Status -  Level of arousal and orientation to time, place, and person were intact. Language including expression, naming, repetition, comprehension was assessed and found intact. Fund of Knowledge was assessed and was  intact.  Cranial Nerves II - XII - II - Visual field intact OU. III, IV, VI - Extraocular movements intact. V - Facial sensation decreased on the left VII - Facial movement decreased on the left VIII - Hearing & vestibular intact bilaterally. X - Palate elevates symmetrically. XI - Chin turning & shoulder shrug intact bilaterally. XII - Tongue protrusion intact.  Motor Strength - The patient's strength was normal in RUE and RLE, however, left upper and lower extremity 4/5.  Bulk was normal and fasciculations were absent.   Motor Tone - Muscle tone was assessed at the neck and appendages and was normal.  Reflexes - The patient's reflexes were symmetrical in all extremities and she had no pathological reflexes.  Sensory - Light touch, temperature/pinprick were assessed and were decreased on the left.    Coordination - The patient had normal movements in the hands with no ataxia or dysmetria, however slow on the left.  Tremor was absent.  Gait and Station - deferred.   ASSESSMENT/PLAN Ms. SPECIAL RANES is a 48 y.o. female with history of uncontrolled diabetes, depression, migraine, DVT in 2012 was on Coumadin  admitted for left-sided weakness.  Had witnessed seizure in CT.  No  TNK given due to symptom resolved.    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 Hypercoagulable state from severely uncontrolled DM- CT head right frontal chronic and subacute infarcts with mild hemorrhagic transformation CT head and neck bilateral CCA thrombus, right more than left.  Multifocal abnormal enhancement in the right cerebral hemisphere, suggestive of enhancing subacute infarcts with epidural hemorrhage CT head repeat stable MRI with and without contrast Multiple foci of T2 hyperintensity and cortical enhancement in the right MCA territory with associated petechial hemorrhage and some restricted diffusion, consistent with subacute infarcts.  2D Echo  EF 70 to 75% TEE no LV thrombus, no LA/LAA thrombus.  Agitated saline contrast bubble study was positive with shunting observed after >6 cardiac cycles suggestive of intrapulmonary shunting. (CTA chest no pulmonary AVM) LDL 132, TG 425 HgbA1c 13.7 UDS positive for THC Autoimmune lab negative so far Hypercoagulable workup negative so far, some still pending Heparin  IV for VTE prophylaxis No antithrombotic prior to admission, was on heparin  IV. Now transition to eliquis . Will need repeat CTA head and neck in 2-3 months, if carotid thrombus resolves, AC can switch to antiplatelet.  Ongoing aggressive stroke risk factor management Therapy recommendations: CIR Disposition: Pending  Seizure Witnessed seizure in CT On Keppra  1 g twice daily, continue on discharge EEG normal with excessive beta activity likely due to benzo Seizure precautions Follow up at [facility] in 4 weeks post discharge  History of DVT ?  Hypercoagulable state History of left lower extremity DVT in 2012, thought due to smoking with OCP use at that time.  Was on Coumadin  at that time, off after several months. 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  No malignancy seen  Uncontrolled diabetes Hyperglycemia HgbA1c 13.7, goal < 7.0 Uncontrolled Currently on insulin  drip CBG monitoring DM education and close PCP follow up May related to hypercoagulable state due to severely uncontrolled DM  Hypertension Stable Long term BP goal normotensive  Hyperlipidemia Home meds: None LDL 132, TG 425, goal LDL < 70 Put on lipitor  80 Continue statin on discharge  Tobacco abuse Current occasional smoker Smoking cessation counseling provided Pt is willing to quit  Other Stroke Risk Factors Migraines Obstructive sleep apnea, on CPAP at home  Other Active Problems Leukocytosis,  WBC 14.0--6.1--9.4 Depression Hypokalemia K 3.2 - supplement--4.2  Hospital day # 2  Neurology will sign off. Please call with questions. Pt will follow up with stroke clinic NP at [facility] in about 4 weeks. Thanks for the consult.  Ary Cummins, MD PhD Stroke Neurology 03/30/2024 5:27 PM    To contact Stroke Continuity provider, please refer to [email or web address]. After hours, contact General Neurology

## 2024-03-30 NOTE — Progress Notes (Signed)
 PHARMACY - ANTICOAGULATION CONSULT NOTE  Pharmacy Consult for heparin  Indication: stroke  Labs: Recent Labs    03/28/24 0524 03/28/24 0538 03/28/24 0538 03/28/24 0546 03/28/24 0548 03/28/24 1433 03/29/24 0718 03/29/24 1905 03/30/24 0453  HGB  --  13.7   < > 16.0* 15.3*  --  13.0  --  12.9  HCT  --  40.9   < > 47.0* 45.0  --  38.0  --  38.5  PLT  --  341  --   --   --   --  335  --  324  APTT  --  29  --   --   --   --   --   --   --   LABPROT  --  13.0  --   --   --   --   --   --   --   INR  --  0.9  --   --   --   --   --   --   --   HEPARINUNFRC  --   --   --   --   --    < > 0.19* 0.14* 0.33  CREATININE 0.95  --   --  0.80  --   --  0.49  --   --   TROPONINIHS  --   --   --   --   --   --  9  --   --    < > = values in this interval not displayed.   Assessment/Plan:  48yo female therapeutic on heparin  after rate change. Will continue infusion at current rate of 1150 units/hr and confirm stable with additional level.  Marvetta Dauphin, PharmD, BCPS 03/30/2024 5:30 AM

## 2024-03-30 NOTE — Progress Notes (Signed)
 PHARMACY - ANTICOAGULATION CONSULT NOTE  Pharmacy Consult for heparin  Indication: stroke  Allergies  Allergen Reactions   Dye Fdc Blue [Brilliant Blue Fcf (Fd&C Blue #1)] Hives    MRI   Other Nausea And Vomiting    Zole    Patient Measurements: Height: 5' 2 (157.5 cm) Weight: 62.1 kg (137 lb) IBW/kg (Calculated) : 50.1 HEPARIN  DW (KG): 62.1  Vital Signs: Temp: 98 F (36.7 C) (08/06 0810) Temp Source: Temporal (08/06 0810) BP: 117/88 (08/06 0950) Pulse Rate: 102 (08/06 0955)  Labs: Recent Labs    03/28/24 0538 03/28/24 0546 03/28/24 0548 03/28/24 1433 03/29/24 0718 03/29/24 1905 03/30/24 0453  HGB 13.7 16.0* 15.3*  --  13.0  --  12.9  HCT 40.9 47.0* 45.0  --  38.0  --  38.5  PLT 341  --   --   --  335  --  324  APTT 29  --   --   --   --   --   --   LABPROT 13.0  --   --   --   --   --   --   INR 0.9  --   --   --   --   --   --   HEPARINUNFRC  --   --   --    < > 0.19* 0.14* 0.33  CREATININE  --  0.80  --   --  0.49  --  0.65  TROPONINIHS  --   --   --   --  9  --   --    < > = values in this interval not displayed.    Estimated Creatinine Clearance: 77 mL/min (by C-G formula based on SCr of 0.65 mg/dL).   Assessment: Terri [name] presents to ED for dizziness and weakness starting ~1wk ago, called as code stroke; CT and CTA reveal acute thrombus in both common carotid arteries and possible subacute infarcts as well as petechial hemorrhage >> to start heparin  infusion given risk<benefit given size and location of intraluminal thrombi.  Heparin  level within goal range 0.33. CBC stable, platelets 324. No issues bleeding reported  Goal of Therapy:  Heparin  level 0.3-0.5 units/ml Monitor platelets by anticoagulation protocol: Yes   Plan:  Continue heparin  infusion at 1150 units/hr Check heparin  level in 8 hours and daily while on heparin  Continue to monitor H&H and platelets F/u plans to transition to oral agent when able  Thank you for allowing pharmacy  to be a part of this patient's care.  Terri [name], PharmD, BCPS Clinical Pharmacist

## 2024-03-30 NOTE — Plan of Care (Signed)

## 2024-03-30 NOTE — Interval H&P Note (Signed)
 History and Physical Interval Note:  03/30/2024 8:48 AM  Terri [name]  has presented today for surgery, with the diagnosis of CVA.  The various methods of treatment have been discussed with the patient and family. After consideration of risks, benefits and other options for treatment, the patient has consented to  Procedure(s): TRANSESOPHAGEAL ECHOCARDIOGRAM (N/A) as a surgical intervention.  The patient's history has been reviewed, patient examined, no change in status, stable for surgery.  I have reviewed the patient's chart and labs.  Questions were answered to the patient's satisfaction.     Npo for TEE for stroke.   Signed, Darryle DASEN. Barbaraann, MD, [facility]  [facility]  [address] [place], [place] [address] [phone]  8:48 AM

## 2024-03-30 NOTE — Progress Notes (Signed)
 Echocardiogram Echocardiogram Transesophageal has been performed.  Terri [name] 03/30/2024, 9:51 AM

## 2024-03-30 NOTE — Anesthesia Preprocedure Evaluation (Addendum)
 Anesthesia Evaluation  Patient identified by MRN, date of birth, ID band Patient awake    Reviewed: Allergy & Precautions, NPO status , Patient's Chart, lab work & pertinent test results  Airway Mallampati: II  TM Distance: >3 FB Neck ROM: Full    Dental no notable dental hx. (+) Dental Advisory Given, Poor Dentition, Chipped, Missing,    Pulmonary Current Smoker   Pulmonary exam normal breath sounds clear to auscultation       Cardiovascular + Peripheral Vascular Disease  Normal cardiovascular exam Rhythm:Regular Rate:Normal  TTE 2025 1. Mild intercavitary gradient noted (<26mmHg) due to hyperdynamic and  underfilled LV. Left ventricular ejection fraction, by estimation, is 70  to 75%. The left ventricle has hyperdynamic function. The left ventricle  has no regional wall motion  abnormalities. Indeterminate diastolic filling due to E-A fusion.   2. Right ventricular systolic function is normal. The right ventricular  size is normal.   3. The mitral valve is normal in structure. No evidence of mitral valve  regurgitation. No evidence of mitral stenosis.   4. The aortic valve is tricuspid. Aortic valve regurgitation is not  visualized. Aortic valve sclerosis is present, with no evidence of aortic  valve stenosis.   5. The inferior vena cava is normal in size with greater than 50%  respiratory variability, suggesting right atrial pressure of 3 mmHg.     Neuro/Psych  Headaches, Seizures - (seizures in setting of stroke),  PSYCHIATRIC DISORDERS  Depression Bipolar Disorder Schizophrenia  CVA (left sided weakness), Residual Symptoms    GI/Hepatic ,GERD  ,,(+)     substance abuse  marijuana use  Endo/Other  negative endocrine ROS    Renal/GU negative Renal ROS  negative genitourinary   Musculoskeletal negative musculoskeletal ROS (+)    Abdominal   Peds  Hematology negative hematology ROS (+)   Anesthesia Other  Findings   Reproductive/Obstetrics                              Anesthesia Physical Anesthesia Plan  ASA: 3  Anesthesia Plan: MAC   Post-op Pain Management:    Induction: Intravenous  PONV Risk Score and Plan: Propofol  infusion and Treatment may vary due to age or medical condition  Airway Management Planned: Natural Airway  Additional Equipment:   Intra-op Plan:   Post-operative Plan:   Informed Consent: I have reviewed the patients History and Physical, chart, labs and discussed the procedure including the risks, benefits and alternatives for the proposed anesthesia with the patient or authorized representative who has indicated his/her understanding and acceptance.     Dental advisory given  Plan Discussed with: CRNA  Anesthesia Plan Comments:          Anesthesia Quick Evaluation

## 2024-03-30 NOTE — Transfer of Care (Signed)
 Immediate Anesthesia Transfer of Care Note  Patient: Terri [name]  Procedure(s) Performed: TRANSESOPHAGEAL ECHOCARDIOGRAM  Patient Location: Cath Lab  Anesthesia Type:MAC  Level of Consciousness: awake, alert , oriented, and patient cooperative  Airway & Oxygen Therapy: Patient Spontanous Breathing  Post-op Assessment: Report given to RN, Post -op Vital signs reviewed and stable, and Patient moving all extremities X 4  Post vital signs: Reviewed and stable  Last Vitals:  Vitals Value Taken Time  BP 100/80 09:37  Temp    Pulse 95   Resp 14   SpO2 95     Last Pain:  Vitals:   03/30/24 0817  TempSrc:   PainSc: 0-No pain         Complications: No notable events documented.

## 2024-03-30 NOTE — Progress Notes (Signed)
 PHARMACY - ANTICOAGULATION CONSULT NOTE  Pharmacy Consult for heparin  ->apixaban  Indication: embolic stroke  Allergies  Allergen Reactions   Dye Fdc Blue [Brilliant Blue Fcf (Fd&C Blue #1)] Hives    MRI   Other Nausea And Vomiting    Zole    Patient Measurements: Height: 5' 2 (157.5 cm) Weight: 62.1 kg (137 lb) IBW/kg (Calculated) : 50.1 HEPARIN  DW (KG): 62.1  Vital Signs: Temp: 98.2 F (36.8 C) (08/06 1100) Temp Source: Oral (08/06 1100) BP: 124/79 (08/06 1100) Pulse Rate: 89 (08/06 1100)  Labs: Recent Labs    03/28/24 0538 03/28/24 0546 03/28/24 0548 03/28/24 1433 03/29/24 0718 03/29/24 1905 03/30/24 0453 03/30/24 1418  HGB 13.7 16.0* 15.3*  --  13.0  --  12.9  --   HCT 40.9 47.0* 45.0  --  38.0  --  38.5  --   PLT 341  --   --   --  335  --  324  --   APTT 29  --   --   --   --   --   --   --   LABPROT 13.0  --   --   --   --   --   --   --   INR 0.9  --   --   --   --   --   --   --   HEPARINUNFRC  --   --   --    < > 0.19* 0.14* 0.33 0.25*  CREATININE  --  0.80  --   --  0.49  --  0.65  --   TROPONINIHS  --   --   --   --  9  --   --   --    < > = values in this interval not displayed.    Estimated Creatinine Clearance: 77 mL/min (by C-G formula based on SCr of 0.65 mg/dL).   Assessment: 48yo female presents to ED for dizziness and weakness starting ~1wk ago, called as code stroke; CT and CTA reveal acute thrombus in both common carotid arteries and possible subacute infarcts as well as petechial hemorrhage >> to start heparin  infusion given risk<benefit given size and location of intraluminal thrombi.  Transitioning to apixaban  for longterm AC.  Goal of Therapy:  Prevention of CVA Monitor platelets by anticoagulation protocol: Yes   Plan:  Apixaban  5mg  po BID Will need copay check and education D/c heparin  when first dose of apixaban  given  Thank you for allowing pharmacy to be a part of this patient's care.  Vito Ralph, PharmD, BCPS Please  see amion for complete clinical pharmacist phone list 03/30/2024 3:55 PM

## 2024-03-30 NOTE — CV Procedure (Signed)
    TRANSESOPHAGEAL ECHOCARDIOGRAM   NAME:  Terri [name]    MRN: [id number] DOB:  1976/08/17    ADMIT DATE: 03/28/2024  INDICATIONS: Stroke  PROCEDURE:   Informed consent was obtained prior to the procedure. The risks, benefits and alternatives for the procedure were discussed and the patient comprehended these risks.  Risks include, but are not limited to, cough, sore throat, vomiting, nausea, somnolence, esophageal and stomach trauma or perforation, bleeding, low blood pressure, aspiration, pneumonia, infection, trauma to the teeth and death.    Procedural time out performed. The oropharynx was anesthetized with topical 1% benzocaine.    Anesthesia was administered by Dr. Niels.  The patient was administered 500 mg of propofol  and 100 mg of lidocaine  to achieve and maintain moderate conscious sedation.  The patient's heart rate, blood pressure, and oxygen saturation are monitored continuously during the procedure. The period of conscious sedation is 12 minutes, of which I was present face-to-face 100% of this time.   The transesophageal probe was inserted in the esophagus and stomach without difficulty and multiple views were obtained.   COMPLICATIONS:    There were no immediate complications.  KEY FINDINGS:  No LA/LAA thrombus.  Bubble study consistent with pulmonary shunt.  No intracardiac source of embolism identified.  LVEF 70-75%.   Full report to follow. Further management per primary team.   Signed, Darryle DASEN. Barbaraann, MD, [facility]  [facility]  [address] [place], [place] [address] [phone]  9:30 AM

## 2024-03-30 NOTE — Progress Notes (Signed)
 PT Cancellation Note  Patient Details Name: TAMECA JEREZ MRN: [id number] DOB: 1975/11/12   Cancelled Treatment:    Reason Eval/Treat Not Completed: Patient at procedure or test/unavailable (PT will continue with attempts)  Randine Essex, PT, MPT  Randine LULLA Essex 03/30/2024, 2:47 PM

## 2024-03-30 NOTE — Progress Notes (Signed)
 Speech Language Pathology Treatment: Cognitive-Linguistic  Patient Details Name: Terri [name] MRN: [id number] DOB: 18-Nov-1975 Today's Date: 03/30/2024 Time: 1640-1700 SLP Time Calculation (min) (ACUTE ONLY): 20 min  Assessment / Plan / Recommendation Clinical Impression  Patient seen by SLP for skilled treatment focused on her cognitive-linguistic function. As compared to SLP documentation from initial evaluation completed on 03/28/24, it appears that her cognition and language skills have improved. Patient demonstrated good awareness to some of her deficits, telling SLP that her memory was still foggier than I would like. She also stated that since the stroke, she isn't able to successfully complete levels on a tablet game that she had already beaten prior to stroke because it was too fast. When SLP asked some clarification questions, it seemed that her cognitive processing was slower and she couldn't keep up. When tasked with describing picture scenes, initially patient gave short, disjointed phrases a guy fishing but this improved to longer phrases/sentences she's washing the dishes and water's running over the sink. Vocal intensity was a little low but SLP had no difficulty hearing or understanding her. Voice was fairly monotone and patient's affect was initially flat, but this improved during session. Patient expressed desire to work on regaining her keyboard typing speed as well as general reaction time/ability to process information from videos, video/tablet type games as these abilities relate to her prior work experience and hobbies. SLP will continue to follow.    HPI HPI: Pt is a 48 year old female who presented on 8/4 with complaints of weakness and AMS. 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  PMH: DVT, Bipolar, Schizoaffective disorder, and migraines.      SLP Plan  Continue with current plan of care          Recommendations   SLP at next venue of care.                      Intermittent Supervision/Assistance Cognitive communication deficit (R41.841)     Continue with current plan of care     Norleen IVAR Blase, MA, CCC-SLP Speech Therapy

## 2024-03-30 NOTE — PMR Pre-admission (Signed)
 PMR Admission Coordinator Pre-Admission Assessment  Patient: Terri [name] is an 48 y.o., female MRN: [id number] DOB: 02-21-1976 Height: 5' 2 (157.5 cm) Weight: 62.1 kg  Insurance Information HMO:     PPO:      PCP:      IPA:      80/20:      OTHER:  PRIMARY: Uninsured  SECONDARY:       Policy#:      Phone#:   Artist:       Phone#:   The Engineer, materials Information Summary" for patients in Inpatient Rehabilitation Facilities with attached "Privacy Act Statement-Health Care Records" was provided and verbally reviewed with: N/A  Emergency Contact Information Contact Information     Name Relation Home Work Mobile   Gibbins,Carolyn Mother [phone]  [phone]      Other Contacts   None on File     Current Medical History  Patient Admitting Diagnosis: CVA History of Present Illness: Pt. Is a 48 year old female with PMH as significant for DVT, Bipolar, Schizoaffective disorder, and migraines who  presented to [name] 8/4/25with complaints of weakness and AMS. Onset of symptoms was approximately 8/3 around 2100. There were also witnessed episodes of seizure like activity (body tightening and arms jumping). Upon arrival to the ED she was found to have flaccid paralysis of the left upper and lower extremities. She was treated as a Code Stroke and was taken immediately to the CT scanner, which showed no LVO, but was positive for acute thrombus in both common carotid arteries as well as enhancement in the right cerebral hemisphere concerning for subacute infarcts. Petechial hemorrhage also seen. She was evaluated by neurology and had a witnessed seizure which was successfully abated with IV ativan . Heparin  infusion started by neurology after weighing risks considering petechial hemorrhage. CT head right frontal chronic and subacute infarcts with mild hemorrhagic transformation CT head and neck showed bilateral CCA thrombus, right more than left.  Multifocal  abnormal enhancement in the right cerebral hemisphere, suggestive of enhancing subacute infarcts with epidural hemorrhage. MRI with and without contrast showed Multiple foci of T2 hyperintensity and cortical enhancement in the right MCA territory with associated petechial hemorrhage and some restricted diffusion, consistent with subacute infarcts.  2D Echo EF 70 to 75%. Pt. Seen by PT/OT/SLP and they recommend CIR to assist return to PLOF.  Complete NIHSS TOTAL: 3  Patient's medical record from [facility]  has been reviewed by the rehabilitation admission coordinator and physician.  Past Medical History  Past Medical History:  Diagnosis Date   DEPRESSION 10/18/2009   ENDOMETRIOSIS 10/18/2009   GERD 10/18/2009   History of HPV infection    MIGRAINE HEADACHE 10/18/2009   UNSPECIFIED PERIPHERAL VASCULAR DISEASE 10/18/2009    Has the patient had major surgery during 100 days prior to admission? No  Family History   family history includes Diabetes in her father and mother; Heart disease (age of onset: 40) in her father; Hyperlipidemia in her mother; Hypertension in her father; Kidney disease in her father; Prostate cancer in her father.  Current Medications  Current Facility-Administered Medications:    acetaminophen  (TYLENOL ) tablet 650 mg, 650 mg, Oral, Q6H PRN, Waddell Aquas A, NP, 650 mg at 03/30/24 1147   atorvastatin  (LIPITOR ) tablet 80 mg, 80 mg, Oral, Daily, Jerri Pfeiffer, MD, 80 mg at 03/30/24 1018   Chlorhexidine  Gluconate Cloth 2 % PADS 6 each, 6 each, Topical, Daily, Rosan Deward ORN, NP, 6 each at 03/29/24  9078   heparin  ADULT infusion 100 units/mL (25000 units/250mL), 1,150 Units/hr, Intravenous, Continuous, Billy Rocky SAUNDERS, RPH, Last Rate: 11.5 mL/hr at 03/30/24 1327, 1,150 Units/hr at 03/30/24 1327   insulin  aspart (novoLOG ) injection 0-20 Units, 0-20 Units, Subcutaneous, Q4H, Ramaswamy, Murali, MD, 7 Units at 03/30/24 1141   insulin  aspart (novoLOG ) injection 3  Units, 3 Units, Subcutaneous, TID WC, Gonfa, Taye T, MD, 3 Units at 03/30/24 1142   insulin  glargine-yfgn (SEMGLEE ) injection 8 Units, 8 Units, Subcutaneous, BID, Hoffman, Paul W, NP, 8 Units at 03/30/24 1018   levETIRAcetam  (KEPPRA ) tablet 1,000 mg, 1,000 mg, Oral, BID, Geronimo Amel, MD, 1,000 mg at 03/30/24 1018   ondansetron  (ZOFRAN ) injection 4 mg, 4 mg, Intravenous, Q6H PRN, Jerri Pfeiffer, MD, 4 mg at 03/29/24 1824   polyethylene glycol (MIRALAX  / GLYCOLAX ) packet 17 g, 17 g, Oral, Daily PRN, Rosan Deward ORN, NP  Patients Current Diet:  Diet Order             Diet regular Room service appropriate? Yes; Fluid consistency: Thin  Diet effective now                   Precautions / Restrictions Precautions Precautions: Fall Restrictions Weight Bearing Restrictions Per Provider Order: No   Has the patient had 2 or more falls or a fall with injury in the past year? Yes  Prior Activity Level Community (5-7x/wk): Pt. active in the community PTA  Prior Functional Level Self Care: Did the patient need help bathing, dressing, using the toilet or eating? Independent  Indoor Mobility: Did the patient need assistance with walking from room to room (with or without device)? Independent  Stairs: Did the patient need assistance with internal or external stairs (with or without device)? Independent  Functional Cognition: Did the patient need help planning regular tasks such as shopping or remembering to take medications? Independent  Patient Information Are you of Hispanic, Latino/a,or Spanish origin?: A. No, not of Hispanic, Latino/a, or Spanish origin What is your race?: B. Black or African American Do you need or want an interpreter to communicate with a doctor or health care staff?: 1. Yes  Patient's Response To:  Health Literacy and Transportation Is the patient able to respond to health literacy and transportation needs?: Yes Health Literacy - How often do you need to have  someone help you when you read instructions, pamphlets, or other written material from your doctor or pharmacy?: Never In the past 12 months, has lack of transportation kept you from medical appointments or from getting medications?: No In the past 12 months, has lack of transportation kept you from meetings, work, or from getting things needed for daily living?: No  Home Assistive Devices / Equipment Home Equipment: Shower seat  Prior Device Use: Indicate devices/aids used by the patient prior to current illness, exacerbation or injury? None of the above  Current Functional Level Cognition  Arousal/Alertness: Awake/alert Overall Cognitive Status: Impaired/Different from baseline Orientation Level: Oriented X4 Attention: Focused, Sustained Focused Attention: Appears intact Sustained Attention: Impaired Sustained Attention Impairment: Verbal complex Memory: Impaired Memory Impairment: Retrieval deficit, Decreased recall of new information (Immediate: 5/5; delayed: 0/5; with cues: 2/5) Awareness: Impaired Awareness Impairment: Intellectual impairment Problem Solving: Impaired Problem Solving Impairment: Verbal complex (0/2) Executive Function: Sequencing, Adult nurse, Reasoning Reasoning: Impaired Reasoning Impairment: Verbal complex Sequencing: Impaired Sequencing Impairment: Verbal complex Organizing: Impaired Organizing Impairment: Verbal complex    Extremity Assessment (includes Sensation/Coordination)  Upper Extremity Assessment: LUE deficits/detail LUE Deficits / Details: 3+/5  compared to RUE, decr coordinaton  Lower Extremity Assessment: Defer to PT evaluation LLE Deficits / Details: grossly 3-/5, impaired coordination    ADLs  Overall ADL's : Needs assistance/impaired Eating/Feeding: Set up Grooming: Contact guard assist, Standing, Oral care Upper Body Bathing: Minimal assistance, Sitting, Standing Lower Body Bathing: Moderate assistance, Sit to/from stand,  Sitting/lateral leans Upper Body Dressing : Minimal assistance, Sitting, Standing Lower Body Dressing: Moderate assistance, Sitting/lateral leans, Sit to/from stand Toilet Transfer: Minimal assistance, Ambulation Functional mobility during ADLs: Minimal assistance    Mobility  Overal bed mobility: Needs Assistance Bed Mobility: Supine to Sit Supine to sit: Min assist General bed mobility comments: increased time, minA for trunk, pt guarded, assist for line management    Transfers  Overall transfer level: Needs assistance Equipment used: 1 person hand held assist Transfers: Sit to/from Stand Sit to Stand: Min assist General transfer comment: minA to power up and steady, pt guarded and cautious reporting i'm definately unsteady, wide base of support    Ambulation / Gait / Stairs / Wheelchair Mobility  Ambulation/Gait Ambulation/Gait assistance: Min assist, Mod assist Gait Distance (Feet): 100 Feet Assistive device: IV Pole Gait Pattern/deviations: Step-to pattern, Decreased stride length, Decreased dorsiflexion - left, Decreased weight shift to left, Knee flexed in stance - left General Gait Details: pt with noted L knee instability, decreased step length, difficulty keeping L hand on IV pole due to weak grip. Pt with L sided antalgia, pt with increased L knee flexion during ambulation with onset of fatigue Gait velocity: dec Gait velocity interpretation: <1.8 ft/sec, indicate of risk for recurrent falls    Posture / Balance Balance Overall balance assessment: Needs assistance Sitting-balance support: Feet supported, No upper extremity supported Sitting balance-Leahy Scale: Fair Standing balance support: Bilateral upper extremity supported, During functional activity, Reliant on assistive device for balance Standing balance-Leahy Scale: Fair Standing balance comment: static standing without AD    Special considerations/life events  Behavioral consideration hx of schizoaffective  and biploar disorders   Previous Home Environment (from acute therapy documentation) Living Arrangements: Parent (mom)  Lives With: Family Available Help at Discharge: Family, Available 24 hours/day Type of Home: House Home Layout: One level Home Access: Stairs to enter Entrance Stairs-Rails: None Entrance Stairs-Number of Steps: 3 Bathroom Shower/Tub: Associate Professor: Yes How Accessible: Accessible via walker Home Care Services: No Additional Comments: unsure of accuracy of home situation as patient states that sometimes she lives with her mom but yet her mom can stay with her all the time if needed  Discharge Living Setting Plans for Discharge Living Setting: Patient's home Type of Home at Discharge: House Discharge Home Layout: One level Discharge Home Access: Stairs to enter Entrance Stairs-Rails: None Entrance Stairs-Number of Steps: 3 Discharge Bathroom Shower/Tub: Tub/shower unit Discharge Bathroom Toilet: Standard Discharge Bathroom Accessibility: Yes How Accessible: Accessible via walker Does the patient have any problems obtaining your medications?: No  Social/Family/Support Systems Patient Roles: Parent Contact Information: [phone] Anticipated Caregiver's Contact Information: 24/7 min A Caregiver Availability: 24/7 Discharge Plan Discussed with Primary Caregiver: Yes Is Caregiver In Agreement with Plan?: No Does Caregiver/Family have Issues with Lodging/Transportation while Pt is in Rehab?: No  Goals Patient/Family Goal for Rehab: PT/OT/SLP Supervision Expected length of stay: 10-12 days Pt/Family Agrees to Admission and willing to participate: Yes Program Orientation Provided & Reviewed with Pt/Caregiver Including Roles  & Responsibilities: Yes  Decrease burden of Care through IP rehab admission: not anticipated  Possible need for SNF placement upon  discharge: Not anticipated  Patient Condition: I have  reviewed medical records [facility], spoken with CM, and family member. I met with patient at the bedside for inpatient rehabilitation assessment.  Patient will benefit from ongoing PT, OT, and SLP, can actively participate in 3 hours of therapy a day 5 days of the week, and can make measurable gains during the admission.  Patient will also benefit from the coordinated team approach during an Inpatient Acute Rehabilitation admission.  The patient will receive intensive therapy as well as Rehabilitation physician, nursing, social worker, and care management interventions.  Due to safety, skin/wound care, disease management, medication administration, pain management, and patient education the patient requires 24 hour a day rehabilitation nursing.  The patient is currently min A  with mobility and basic ADLs.  Discharge setting and therapy post discharge at home with home health is anticipated.  Patient has agreed to participate in the Acute Inpatient Rehabilitation Program and will admit today.  Preadmission Screen Completed By:  Leita KATHEE Kleine, 03/30/2024 1:35 PM ______________________________________________________________________   Discussed status with Dr. Audry Kauzlarich on 03/31/24 at 900 and received approval for admission today.  Admission Coordinator:  Leita KATHEE Kleine, CCC-SLP, time 1110/Date 03/31/24   Assessment/Plan: Diagnosis: R MCA infarct Does the need for close, 24 hr/day Medical supervision in concert with the patient's rehab needs make it unreasonable for this patient to be served in a less intensive setting? Yes Co-Morbidities requiring supervision/potential complications:  Schizoaffective d/o; Bipolar d/o;  migraines, DVT; seizures Due to bladder management, bowel management, safety, skin/wound care, disease management, medication administration, pain management, and patient education, does the patient require 24 hr/day rehab nursing? Yes Does the patient require coordinated  care of a physician, rehab nurse, PT, OT, and SLP to address physical and functional deficits in the context of the above medical diagnosis(es)? Yes Addressing deficits in the following areas: balance, endurance, locomotion, strength, transferring, bowel/bladder control, bathing, dressing, feeding, grooming, toileting, cognition, speech, and language Can the patient actively participate in an intensive therapy program of at least 3 hrs of therapy 5 days a week? Yes The potential for patient to make measurable gains while on inpatient rehab is good Anticipated functional outcomes upon discharge from inpatient rehab: supervision PT, supervision OT, supervision SLP Estimated rehab length of stay to reach the above functional goals is: 10-12 days Anticipated discharge destination: Home 10. Overall Rehab/Functional Prognosis: good   MD Signature:

## 2024-03-30 NOTE — Inpatient Diabetes Management (Signed)
 Inpatient Diabetes Program Recommendations  AACE/ADA: New Consensus Statement on Inpatient Glycemic Control (2015)  Target Ranges:  Prepandial:   less than 140 mg/dL      Peak postprandial:   less than 180 mg/dL (1-2 hours)      Critically ill patients:  140 - 180 mg/dL   Lab Results  Component Value Date   GLUCAP 201 (H) 03/30/2024   HGBA1C 13.7 (H) 03/29/2024    Review of Glycemic Control  Latest Reference Range & Units 03/29/24 07:47 03/29/24 11:42 03/29/24 15:58 03/29/24 19:42 03/30/24 00:05 03/30/24 04:13 03/30/24 09:47 03/30/24 10:59  Glucose-Capillary 70 - 99 mg/dL 725 (H) 779 (H) 775 (H) 264 (H) 222 (H) 184 (H) 216 (H) 201 (H)   Diabetes history: New Diabetes Diagnosis per pt  Current orders for Inpatient glycemic control:  Semglee  8 units bid Novolog  0-20 units Q4 Novolog  3 units tid meal coverage  Left sided weakness but can grip insulin  pen and pen needle  A1c 13.7% on 8/5  Spoke with pt at bedside regarding A1c of 13.7% and new diagnosis of Diabetes. Pt reports she had hypoglycemia when she was younger but never a Diagnosis of Diabetes or pre diabetes. Pt is familiar with and has experience giving insulin  to others via vial and syringe but never insulin  pen. Pt also familiar with how to check a glucose. Right now pt does not have insurance but will have pending medicaid. Will follow pt and see tomorrow 8/7 (show operation of insulin  pen and review CBG, will also cover lifestyle modification and glucose and A1c goals, will discuss hypo/hyperglycemia s/s and treatment for both). Pt is considering CIR. Will follow pt for education and discharge needs.  Thanks,  Clotilda Bull RN, MSN, BC-ADM Inpatient Diabetes Coordinator Team Pager [phone] (8a-5p)

## 2024-03-30 NOTE — Progress Notes (Signed)
 PROGRESS NOTE  Terri [name] FMW:[id number] DOB: 1975/11/14   PCP: Patient, No Pcp Per  Patient is from: Home.  DOA: 03/28/2024 LOS: 2  Chief complaints Chief Complaint  Patient presents with   Weakness     Brief Narrative / Interim history: 48 year old F with PMH of poorly controlled DM-2, DVT, PAD, HTN, HLD, tobacco use disorder, migraine headache, depression and bipolar disorder presenting with left-sided weakness, altered mental status and seizure-like activity, and admitted to ICU on 8/5 with right MCA CVA, bilateral ICA thrombosis and new onset seizure.  She had witnessed seizure in ED.  Neurology consulted.  CT head with right frontal chronic and subacute infarcts with mild hemorrhagic transformation.  Did not receive TNK. CT head and neck bilateral CCA thrombus, right more than left, and multifocal abnormal enhancement in the right cerebral hemisphere, suggestive of enhancing subacute infarcts with epidural hemorrhage.  Repeat CT head stable.  MRI brain with and without contrast consistent with multiple foci of subacute infarcts in right MCA territory.  TTE without significant finding.  LDL 132.  TG 425.  A1c 13.7%.  UDS positive for THC.  Patient was started on IV heparin .  TEE, hypercoagulable labs and autoimmune labs ordered.  Therapy recommended CIR.  Patient was transferred to hospitalist service on 8/6.  TEE consistent with intrapulmonary shunt    Subjective: Seen and examined earlier this morning.  No major events overnight or this morning.  Reports feeling better.  He can feel sore from the bite she had during seizure.  Reports improvement in left-sided weakness and soreness.  Objective: Vitals:   03/30/24 0940 03/30/24 0945 03/30/24 0950 03/30/24 0955  BP: 112/83 115/81 117/88   Pulse: 99 (!) 101 97 (!) 102  Resp: 20 20 (!) 26 (!) 23  Temp:      TempSrc:      SpO2: 95% 98% 96% 98%  Weight:      Height:        Examination:  GENERAL: No apparent  distress.  Nontoxic. HEENT: MMM.  Vision and hearing grossly intact.  NECK: Supple.  No apparent JVD.  RESP:  No IWOB.  Fair aeration bilaterally. CVS:  RRR. Heart sounds normal.  ABD/GI/GU: BS+. Abd soft, NTND.  MSK/EXT:  Moves extremities. No apparent deformity. No edema.  SKIN: no apparent skin lesion or wound NEURO: AA.  Oriented appropriately.  PERRL.  Clear speech.  Motor 4/5 in the left and 5/5 in the right.  Left-sided paresthesia.  Some left-sided pronator drift and dysmetria PSYCH: Calm. Normal affect.   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  Was on warfarin in 2012.  No longer on AC.  TEE shows intrapulmonary shunting.  CVA workup as above. -Check lower extremity venous Doppler -Continue IV heparin  for anticoagulation -Follow further neurology recommendation -Continue Lipitor  -Therapy recommended CIR.  Bilateral CCA thrombus: Noted on CT angio head and neck. -Anticoagulation as above  New onset seizure: Due to CVA?  Witnessed seizure in CT.  EEG normal. -Continue Keppra  1 g twice daily -Seizure precaution   History of provoked DVT-in 2012.  Was on warfarin.  Thought to be due to smoking and OCP at that time.  CT angio chest negative for PE.  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 -Follow hypercoagulable labs -Check  lower extremity venous Doppler -Continue IV heparin   Poorly controlled NIDDM-2 with hyperglycemia: A1c 13.7%.  Not on medications. Recent Labs  Lab 03/29/24 1558 03/29/24 1942 03/30/24 0005 03/30/24 0413 03/30/24 0947  GLUCAP 224* 264* 222* 184* 216*  - Continue SSI-resistant scale -Continue Semglee  8 units twice daily -Add NovoLog  3 units 3 times daily with  meals -Consult diabetic coordinator.  Mood disorder/history of depression and BPD: Stable.  Does not seem to be on meds. -Outpatient follow-up  Essential hypertension? Normotensive.  Not on meds.   Hyperlipidemia: LDL 132.  TG 425. -Now on Lipitor  80 mg daily   Tobacco use disorder: Current occasional smoker -Smoking cessation counseling provided -Pt is willing to quit   OSA on CPAP -Continue nightly CPAP   Leukocytosis/bandemia: Resolved.  Hypokalemia -Monitor replenish K and Mg as appropriate   Body mass index is 25.06 kg/m.          DVT prophylaxis:  On full dose anticoagulation  Code Status: Full code Family Communication: Updated patient's mother at bedside. Level of care: Telemetry Medical Status is: Inpatient Remains inpatient appropriate because: Acute CVA   Final disposition: CIR   55 minutes with more than 50% spent in reviewing records, counseling patient/family and coordinating care.   Sch Meds:  Scheduled Meds:  atorvastatin   80 mg Oral Daily   Chlorhexidine  Gluconate Cloth  6 each Topical Daily   insulin  aspart  0-20 Units Subcutaneous Q4H   insulin  glargine-yfgn  8 Units Subcutaneous BID   levETIRAcetam   1,000 mg Oral BID   Continuous Infusions:  heparin  1,150 Units/hr (03/30/24 0901)   PRN Meds:.acetaminophen , ondansetron  (ZOFRAN ) IV, polyethylene glycol  Antimicrobials: Anti-infectives (From admission, onward)    None        I have personally reviewed the following labs and images: CBC: Recent Labs  Lab 03/28/24 0538 03/28/24 0546 03/28/24 0548 03/29/24 0718 03/30/24 0453  WBC 14.0*  --   --  10.4 9.4  NEUTROABS 9.6*  --   --   --   --   HGB 13.7 16.0* 15.3* 13.0 12.9  HCT 40.9 47.0* 45.0 38.0 38.5  MCV 88.1  --   --  86.0 86.9  PLT 341  --   --  335 324   BMP &GFR Recent Labs  Lab 03/28/24 0524 03/28/24 0546 03/28/24 0548 03/29/24 0718 03/30/24 0453  NA 136 138 137 136 137  K 3.5 3.7 3.7 3.2* 4.2  CL 99  98  --  104 108  CO2 24  --   --  24 21*  GLUCOSE 504* 500*  --  238* 212*  BUN 6 7  --  <5* <5*  CREATININE 0.95 0.80  --  0.49 0.65  CALCIUM  9.3  --   --  8.8* 8.8*  MG  --   --   --  1.7 2.0  PHOS  --   --   --  4.4 4.3   Estimated Creatinine Clearance: 77 mL/min (by C-G formula based on SCr of 0.65 mg/dL). Liver & Pancreas: Recent Labs  Lab 03/28/24 0524 03/30/24 0453  AST 22 17  ALT 10 12  ALKPHOS 93 67  BILITOT 0.7 0.6  PROT 6.8 6.2*  ALBUMIN 3.2* 2.7*   No results for input(s): LIPASE, AMYLASE in the last 168 hours. No results for input(s): AMMONIA in the last 168 hours. Diabetic: Recent Labs    03/29/24 0718  HGBA1C 13.7*   Recent Labs  Lab 03/29/24 1558 03/29/24 1942 03/30/24 0005  03/30/24 0413 03/30/24 0947  GLUCAP 224* 264* 222* 184* 216*   Cardiac Enzymes: No results for input(s): CKTOTAL, CKMB, CKMBINDEX, TROPONINI in the last 168 hours. No results for input(s): PROBNP in the last 8760 hours. Coagulation Profile: Recent Labs  Lab 03/28/24 0538  INR 0.9   Thyroid Function Tests: No results for input(s): TSH, T4TOTAL, FREET4, T3FREE, THYROIDAB in the last 72 hours. Lipid Profile: Recent Labs    03/29/24 0718  CHOL 233*  HDL 38*  LDLCALC UNABLE TO CALCULATE IF TRIGLYCERIDE OVER 400 mg/dL  TRIG 574*  CHOLHDL 6.1  LDLDIRECT 132*   Anemia Panel: No results for input(s): VITAMINB12, FOLATE, FERRITIN, TIBC, IRON, RETICCTPCT in the last 72 hours. Urine analysis:    Component Value Date/Time   COLORURINE STRAW (A) 03/28/2024 0542   APPEARANCEUR CLEAR 03/28/2024 0542   LABSPEC 1.031 (H) 03/28/2024 0542   PHURINE 7.0 03/28/2024 0542   GLUCOSEU >=500 (A) 03/28/2024 0542   HGBUR MODERATE (A) 03/28/2024 0542   BILIRUBINUR NEGATIVE 03/28/2024 0542   BILIRUBINUR n 07/22/2011 1110   KETONESUR NEGATIVE 03/28/2024 0542   PROTEINUR 30 (A) 03/28/2024 0542   UROBILINOGEN 0.2 07/22/2011 1110   NITRITE NEGATIVE  03/28/2024 0542   LEUKOCYTESUR NEGATIVE 03/28/2024 0542   Sepsis Labs: Invalid input(s): PROCALCITONIN, LACTICIDVEN  Microbiology: Recent Results (from the past 240 hours)  Blood culture (routine x 2)     Status: None (Preliminary result)   Collection Time: 03/28/24  6:31 AM   Specimen: BLOOD  Result Value Ref Range Status   Specimen Description BLOOD RIGHT ANTECUBITAL  Final   Special Requests   Final    BOTTLES DRAWN AEROBIC AND ANAEROBIC Blood Culture adequate volume   Culture   Final    NO GROWTH 2 DAYS Performed at [facility], [address], [place], [place] [address]    Report Status PENDING  Incomplete  MRSA Next Gen by PCR, Nasal     Status: None   Collection Time: 03/28/24  9:24 AM   Specimen: Nasal Mucosa; Nasal Swab  Result Value Ref Range Status   MRSA by PCR Next Gen NOT DETECTED NOT DETECTED Final    Comment: (NOTE) The GeneXpert MRSA Assay (FDA approved for NASAL specimens only), is one component of a comprehensive MRSA colonization surveillance program. It is not intended to diagnose MRSA infection nor to guide or monitor treatment for MRSA infections. Test performance is not FDA approved in patients less than 24 years old. Performed at [facility], [address], [place], [place] [address]   Blood culture (routine x 2)     Status: None (Preliminary result)   Collection Time: 03/28/24  9:33 AM   Specimen: BLOOD LEFT ARM  Result Value Ref Range Status   Specimen Description BLOOD LEFT ARM  Final   Special Requests   Final    BOTTLES DRAWN AEROBIC AND ANAEROBIC Blood Culture results may not be optimal due to an inadequate volume of blood received in culture bottles   Culture   Final    NO GROWTH 2 DAYS Performed at [facility], [address], [place], [place] [address]    Report Status PENDING  Incomplete    Radiology Studies: ECHO TEE Result Date: 03/30/2024    TRANSESOPHOGEAL ECHO REPORT   Patient Name:   Terri [name] Date of Exam: 03/30/2024 Medical Rec #:  [id number]           Height:  62.0 in Accession #:    [id number]          Weight:       137.0 lb Date of Birth:  Apr 13, 1976          BSA:          1.628 m Patient Age:    47 years            BP:           118/98 mmHg Patient Gender: F                   HR:           104 bpm. Exam Location:  Inpatient Procedure: Transesophageal Echo, Cardiac Doppler, Color Doppler and 3D Echo            (Both Spectral and Color Flow Doppler were utilized during            procedure). Indications:     Stroke  History:         Patient has prior history of Echocardiogram examinations, most                  recent 03/28/2024. Stroke and Migraine.  Sonographer:     Thea Norlander RCS Referring Phys:  [id number] THOM CROME HALEY Diagnosing Phys: Darryle Decent MD PROCEDURE: After discussion of the risks and benefits of a TEE, an informed consent was obtained from the patient. TEE procedure time was 12 minutes. The transesophogeal probe was passed without difficulty through the esophogus of the patient. Imaged were obtained with the patient in a left lateral decubitus position. Sedation performed by different physician. The patient was monitored while under deep sedation. Anesthestetic sedation was provided intravenously by Anesthesiology: 491.23mg  of Propofol , 100mg  of Lidocaine . Image quality was excellent. The patient's vital signs; including heart rate, blood pressure, and oxygen saturation; remained stable throughout the procedure. The patient developed no complications during the procedure.  IMPRESSIONS  1. Bubble study shows crossing >6 cardiac cycles which is more consistent with intrapulmonary shunt.  2. Left ventricular ejection fraction, by estimation, is 65 to 70%. The left ventricle has normal function.  3. Right ventricular systolic function is normal. The right ventricular size is normal.  4. No left atrial/left atrial appendage thrombus was detected. The LAA emptying velocity  was 67 cm/s.  5. The mitral valve is grossly normal. No evidence of mitral valve regurgitation. No evidence of mitral stenosis.  6. The aortic valve is tricuspid. Aortic valve regurgitation is not visualized. No aortic stenosis is present.  7. Agitated saline contrast bubble study was positive with shunting observed after >6 cardiac cycles suggestive of intrapulmonary shunting.  8. 3D performed of the mitral valve and demonstrates 3D mitral valve was normal. Conclusion(s)/Recommendation(s): No LA/LAA thrombus identified. Negative bubble study for interatrial shunt. No intracardiac source of embolism detected on this on this transesophageal echocardiogram. FINDINGS  Left Ventricle: Left ventricular ejection fraction, by estimation, is 65 to 70%. The left ventricle has normal function. The left ventricular internal cavity size was normal in size. Right Ventricle: The right ventricular size is normal. No increase in right ventricular wall thickness. Right ventricular systolic function is normal. Left Atrium: Left atrial size was normal in size. No left atrial/left atrial appendage thrombus was detected. The LAA emptying velocity was 67 cm/s. Right Atrium: Right atrial size was normal in size. Pericardium: There is no evidence of pericardial effusion. Mitral Valve: The mitral valve is grossly normal. No evidence  of mitral valve regurgitation. No evidence of mitral valve stenosis. Tricuspid Valve: The tricuspid valve is grossly normal. Tricuspid valve regurgitation is trivial. No evidence of tricuspid stenosis. Aortic Valve: The aortic valve is tricuspid. Aortic valve regurgitation is not visualized. No aortic stenosis is present. Pulmonic Valve: The pulmonic valve was normal in structure. Pulmonic valve regurgitation is not visualized. Aorta: The aortic root and ascending aorta are structurally normal, with no evidence of dilitation. Venous: The left upper pulmonary vein, right lower pulmonary vein, right upper  pulmonary vein and left lower pulmonary vein are normal. IAS/Shunts: No atrial level shunt detected by color flow Doppler. Agitated saline contrast was given intravenously to evaluate for intracardiac shunting. Agitated saline contrast bubble study was positive with shunting observed after >6 cardiac cycles suggestive of intrapulmonary shunting. Additional Comments: 3D was performed not requiring image post processing on an independent workstation and was normal. Spectral Doppler performed. LEFT VENTRICLE PLAX 2D LVOT diam:     2.02 cm LVOT Area:     3.20 cm   AORTA Ao Root diam: 2.90 cm Ao Asc diam:  2.72 cm  SHUNTS Systemic Diam: 2.02 cm Darryle Decent MD Electronically signed by Darryle Decent MD Signature Date/Time: 03/30/2024/10:26:55 AM    Final    EP STUDY Result Date: 03/30/2024 See surgical note for result.     Lorna Strother T. Cienna Dumais [facility]  If 7PM-7AM, please contact night-coverage [email or web address] 03/30/2024, 10:37 AM

## 2024-03-30 NOTE — Progress Notes (Signed)
 Inpatient Rehab Admissions Coordinator:    I met with Pt. To discuss CIR. She is interested and mother was present and states she can provide 24/7 support. Pt. Is uninsured and cost estimate reviewed.   Leita Kleine, MS, CCC-SLP Rehab Admissions Coordinator  [phone] (celll) [phone] (office)

## 2024-03-31 ENCOUNTER — Other Ambulatory Visit (HOSPITAL_COMMUNITY): Payer: Self-pay

## 2024-03-31 ENCOUNTER — Other Ambulatory Visit: Payer: Self-pay

## 2024-03-31 ENCOUNTER — Encounter (HOSPITAL_COMMUNITY): Payer: Self-pay | Admitting: Internal Medicine

## 2024-03-31 ENCOUNTER — Encounter (HOSPITAL_COMMUNITY): Payer: Self-pay | Admitting: Physical Medicine & Rehabilitation

## 2024-03-31 ENCOUNTER — Inpatient Hospital Stay (HOSPITAL_COMMUNITY)
Admission: AD | Admit: 2024-03-31 | Discharge: 2024-04-07 | DRG: 057 | Disposition: A | Discharge status: Home / Self Care | Payer: Self-pay | Source: Intra-hospital | Attending: Physical Medicine & Rehabilitation | Admitting: Physical Medicine & Rehabilitation

## 2024-03-31 DIAGNOSIS — L732 Hidradenitis suppurativa: Secondary | ICD-10-CM | POA: Diagnosis present

## 2024-03-31 DIAGNOSIS — F259 Schizoaffective disorder, unspecified: Secondary | ICD-10-CM | POA: Diagnosis present

## 2024-03-31 DIAGNOSIS — Z9049 Acquired absence of other specified parts of digestive tract: Secondary | ICD-10-CM

## 2024-03-31 DIAGNOSIS — Z91041 Radiographic dye allergy status: Secondary | ICD-10-CM

## 2024-03-31 DIAGNOSIS — E785 Hyperlipidemia, unspecified: Secondary | ICD-10-CM | POA: Insufficient documentation

## 2024-03-31 DIAGNOSIS — Z5971 Insufficient health insurance coverage: Secondary | ICD-10-CM

## 2024-03-31 DIAGNOSIS — Z833 Family history of diabetes mellitus: Secondary | ICD-10-CM

## 2024-03-31 DIAGNOSIS — E119 Type 2 diabetes mellitus without complications: Secondary | ICD-10-CM

## 2024-03-31 DIAGNOSIS — F17210 Nicotine dependence, cigarettes, uncomplicated: Secondary | ICD-10-CM | POA: Diagnosis present

## 2024-03-31 DIAGNOSIS — F319 Bipolar disorder, unspecified: Secondary | ICD-10-CM | POA: Diagnosis present

## 2024-03-31 DIAGNOSIS — I63511 Cerebral infarction due to unspecified occlusion or stenosis of right middle cerebral artery: Principal | ICD-10-CM

## 2024-03-31 DIAGNOSIS — F17200 Nicotine dependence, unspecified, uncomplicated: Secondary | ICD-10-CM | POA: Diagnosis present

## 2024-03-31 DIAGNOSIS — Z79899 Other long term (current) drug therapy: Secondary | ICD-10-CM

## 2024-03-31 DIAGNOSIS — Z841 Family history of disorders of kidney and ureter: Secondary | ICD-10-CM

## 2024-03-31 DIAGNOSIS — Z7901 Long term (current) use of anticoagulants: Secondary | ICD-10-CM

## 2024-03-31 DIAGNOSIS — K219 Gastro-esophageal reflux disease without esophagitis: Secondary | ICD-10-CM | POA: Diagnosis present

## 2024-03-31 DIAGNOSIS — E1165 Type 2 diabetes mellitus with hyperglycemia: Secondary | ICD-10-CM

## 2024-03-31 DIAGNOSIS — I749 Embolism and thrombosis of unspecified artery: Secondary | ICD-10-CM

## 2024-03-31 DIAGNOSIS — Z83438 Family history of other disorder of lipoprotein metabolism and other lipidemia: Secondary | ICD-10-CM

## 2024-03-31 DIAGNOSIS — Z8249 Family history of ischemic heart disease and other diseases of the circulatory system: Secondary | ICD-10-CM

## 2024-03-31 DIAGNOSIS — I69354 Hemiplegia and hemiparesis following cerebral infarction affecting left non-dominant side: Principal | ICD-10-CM

## 2024-03-31 DIAGNOSIS — Z86718 Personal history of other venous thrombosis and embolism: Secondary | ICD-10-CM

## 2024-03-31 DIAGNOSIS — G4089 Other seizures: Secondary | ICD-10-CM | POA: Diagnosis present

## 2024-03-31 DIAGNOSIS — E1151 Type 2 diabetes mellitus with diabetic peripheral angiopathy without gangrene: Secondary | ICD-10-CM | POA: Diagnosis present

## 2024-03-31 LAB — GLUCOSE, CAPILLARY
Glucose-Capillary: 129 mg/dL — ABNORMAL HIGH (ref 70–99)
Glucose-Capillary: 141 mg/dL — ABNORMAL HIGH (ref 70–99)
Glucose-Capillary: 157 mg/dL — ABNORMAL HIGH (ref 70–99)
Glucose-Capillary: 193 mg/dL — ABNORMAL HIGH (ref 70–99)
Glucose-Capillary: 195 mg/dL — ABNORMAL HIGH (ref 70–99)
Glucose-Capillary: 243 mg/dL — ABNORMAL HIGH (ref 70–99)

## 2024-03-31 LAB — LUPUS ANTICOAGULANT PANEL
DRVVT: 44.8 s (ref 0.0–47.0)
PTT Lupus Anticoagulant: 27.8 s (ref 0.0–43.5)

## 2024-03-31 LAB — MAGNESIUM: Magnesium: 1.9 mg/dL (ref 1.7–2.4)

## 2024-03-31 LAB — CARDIOLIPIN ANTIBODIES, IGG, IGM, IGA
Anticardiolipin IgA: <9 U/mL (ref 0–11)
Anticardiolipin IgG: <9 GPL U/mL (ref 0–14)
Anticardiolipin IgM: <9 [MPL'U]/mL (ref 0–12)

## 2024-03-31 LAB — RENAL FUNCTION PANEL
Albumin: 2.7 g/dL — ABNORMAL LOW (ref 3.5–5.0)
Anion gap: 8 (ref 5–15)
BUN: 5 mg/dL — ABNORMAL LOW (ref 6–20)
CO2: 24 mmol/L (ref 22–32)
Calcium: 8.8 mg/dL — ABNORMAL LOW (ref 8.9–10.3)
Chloride: 106 mmol/L (ref 98–111)
Creatinine, Ser: 0.67 mg/dL (ref 0.44–1.00)
GFR, Estimated: >60 mL/min (ref >60)
Glucose, Bld: 147 mg/dL — ABNORMAL HIGH (ref 70–99)
Phosphorus: 5.1 mg/dL — ABNORMAL HIGH (ref 2.5–4.6)
Potassium: 4.5 mmol/L (ref 3.5–5.1)
Sodium: 138 mmol/L (ref 135–145)

## 2024-03-31 LAB — C-PEPTIDE: C-Peptide: 1.6 ng/mL (ref 1.1–4.4)

## 2024-03-31 LAB — PROTEIN C, TOTAL: Protein C, Total: 139 % (ref 60–150)

## 2024-03-31 MED ORDER — ACETAMINOPHEN 325 MG PO TABS
325.0000 mg | ORAL_TABLET | ORAL | Status: DC | PRN
  Administered 2024-03-31 – 2024-04-07 (×24): 650 mg via ORAL
  Filled 2024-03-31 (×17): qty 2

## 2024-03-31 MED ORDER — LEVETIRACETAM 500 MG PO TABS
1000.0000 mg | ORAL_TABLET | Freq: Two times a day (BID) | ORAL | Status: DC
  Administered 2024-03-31 – 2024-04-07 (×20): 1000 mg via ORAL
  Filled 2024-03-31 (×14): qty 2

## 2024-03-31 MED ORDER — INSULIN ASPART 100 UNIT/ML IJ SOLN
0.0000 [IU] | Freq: Three times a day (TID) | INTRAMUSCULAR | Status: DC
  Administered 2024-03-31: 5 [IU] via SUBCUTANEOUS

## 2024-03-31 MED ORDER — BISACODYL 10 MG RE SUPP: 10.0000 mg | Freq: Every day | RECTAL | Status: DC | PRN

## 2024-03-31 MED ORDER — ATORVASTATIN CALCIUM 80 MG PO TABS
80.0000 mg | ORAL_TABLET | Freq: Every day | ORAL | Status: DC
  Administered 2024-04-01 – 2024-04-07 (×10): 80 mg via ORAL
  Filled 2024-03-31 (×7): qty 1

## 2024-03-31 MED ORDER — TRAZODONE HCL 50 MG PO TABS
25.0000 mg | ORAL_TABLET | Freq: Every evening | ORAL | Status: DC | PRN
  Administered 2024-04-02: 50 mg via ORAL
  Filled 2024-03-31: qty 1

## 2024-03-31 MED ORDER — TOPIRAMATE 25 MG PO TABS
25.0000 mg | ORAL_TABLET | Freq: Every day | ORAL | Status: DC
  Administered 2024-03-31: 25 mg via ORAL
  Filled 2024-03-31: qty 1

## 2024-03-31 MED ORDER — PROCHLORPERAZINE MALEATE 5 MG PO TABS
5.0000 mg | ORAL_TABLET | Freq: Four times a day (QID) | ORAL | Status: DC | PRN
  Administered 2024-04-02 – 2024-04-03 (×2): 5 mg via ORAL
  Administered 2024-04-04 (×2): 10 mg via ORAL
  Filled 2024-03-31: qty 2
  Filled 2024-03-31 (×2): qty 1

## 2024-03-31 MED ORDER — INSULIN ASPART 100 UNIT/ML IJ SOLN: 0.0000 [IU] | Freq: Three times a day (TID) | INTRAMUSCULAR | Status: DC

## 2024-03-31 MED ORDER — GUAIFENESIN-DM 100-10 MG/5ML PO SYRP
5.0000 mL | ORAL_SOLUTION | Freq: Four times a day (QID) | ORAL | Status: DC | PRN
  Administered 2024-04-01 – 2024-04-06 (×8): 10 mL via ORAL
  Filled 2024-03-31 (×6): qty 10

## 2024-03-31 MED ORDER — POLYETHYLENE GLYCOL 3350 17 G PO PACK: 17.0000 g | PACK | Freq: Every day | ORAL | Status: DC | PRN

## 2024-03-31 MED ORDER — APIXABAN 5 MG PO TABS
5.0000 mg | ORAL_TABLET | Freq: Two times a day (BID) | ORAL | 0 refills | Status: DC
  Filled 2024-03-31 – 2024-04-06 (×2): qty 60, 30d supply, fill #0

## 2024-03-31 MED ORDER — INSULIN GLARGINE-YFGN 100 UNIT/ML ~~LOC~~ SOLN
15.0000 [IU] | Freq: Two times a day (BID) | SUBCUTANEOUS | Status: DC
  Filled 2024-03-31: qty 0.15

## 2024-03-31 MED ORDER — FLEET ENEMA RE ENEM
1.0000 | ENEMA | Freq: Once | RECTAL | Status: DC | PRN
Start: 2024-03-31 — End: 2024-04-07

## 2024-03-31 MED ORDER — PROCHLORPERAZINE 25 MG RE SUPP: 12.5000 mg | Freq: Four times a day (QID) | RECTAL | Status: DC | PRN

## 2024-03-31 MED ORDER — SACCHAROMYCES BOULARDII 250 MG PO CAPS
250.0000 mg | ORAL_CAPSULE | Freq: Two times a day (BID) | ORAL | Status: DC
  Administered 2024-03-31 – 2024-04-07 (×20): 250 mg via ORAL
  Filled 2024-03-31 (×14): qty 1

## 2024-03-31 MED ORDER — APIXABAN 5 MG PO TABS: 5.0000 mg | ORAL_TABLET | Freq: Two times a day (BID) | ORAL | Status: DC

## 2024-03-31 MED ORDER — PROCHLORPERAZINE EDISYLATE 10 MG/2ML IJ SOLN: 5.0000 mg | Freq: Four times a day (QID) | INTRAMUSCULAR | Status: DC | PRN

## 2024-03-31 MED ORDER — INSULIN ASPART 100 UNIT/ML IJ SOLN
0.0000 [IU] | Freq: Three times a day (TID) | INTRAMUSCULAR | Status: DC
  Administered 2024-03-31 – 2024-04-01 (×2): 2 [IU] via SUBCUTANEOUS
  Administered 2024-04-01 (×2): 3 [IU] via SUBCUTANEOUS
  Administered 2024-04-02 – 2024-04-05 (×9): 2 [IU] via SUBCUTANEOUS
  Administered 2024-04-05: 1 [IU] via SUBCUTANEOUS
  Administered 2024-04-05: 2 [IU] via SUBCUTANEOUS
  Administered 2024-04-05: 1 [IU] via SUBCUTANEOUS
  Administered 2024-04-06 (×4): 2 [IU] via SUBCUTANEOUS
  Administered 2024-04-06 (×2): 1 [IU] via SUBCUTANEOUS
  Administered 2024-04-07: 2 [IU] via SUBCUTANEOUS

## 2024-03-31 MED ORDER — INSULIN ASPART 100 UNIT/ML IJ SOLN
0.0000 [IU] | Freq: Every day | INTRAMUSCULAR | Status: DC
  Administered 2024-04-01: 2 [IU] via SUBCUTANEOUS

## 2024-03-31 MED ORDER — ALUM & MAG HYDROXIDE-SIMETH 200-200-20 MG/5ML PO SUSP
30.0000 mL | ORAL | Status: DC | PRN
Start: 2024-03-31 — End: 2024-04-07
  Administered 2024-04-03 – 2024-04-07 (×4): 30 mL via ORAL
  Filled 2024-03-31 (×3): qty 30

## 2024-03-31 MED ORDER — INSULIN ASPART 100 UNIT/ML IJ SOLN: 5.0000 [IU] | Freq: Three times a day (TID) | INTRAMUSCULAR | Status: DC

## 2024-03-31 MED ORDER — INSULIN ASPART 100 UNIT/ML IJ SOLN: 4.0000 [IU] | Freq: Three times a day (TID) | INTRAMUSCULAR | Status: DC

## 2024-03-31 MED ORDER — INSULIN ASPART 100 UNIT/ML IJ SOLN
4.0000 [IU] | Freq: Three times a day (TID) | INTRAMUSCULAR | Status: DC
  Administered 2024-03-31: 4 [IU] via SUBCUTANEOUS

## 2024-03-31 MED ORDER — TOPIRAMATE 25 MG PO TABS
25.0000 mg | ORAL_TABLET | Freq: Every day | ORAL | Status: DC
  Administered 2024-04-01 – 2024-04-06 (×9): 25 mg via ORAL
  Filled 2024-03-31 (×6): qty 1

## 2024-03-31 MED ORDER — INSULIN GLARGINE-YFGN 100 UNIT/ML ~~LOC~~ SOLN: 15.0000 [IU] | Freq: Two times a day (BID) | SUBCUTANEOUS | Status: DC

## 2024-03-31 MED ORDER — ACETAMINOPHEN 325 MG PO TABS: 650.0000 mg | ORAL_TABLET | Freq: Four times a day (QID) | ORAL | Status: AC | PRN

## 2024-03-31 MED ORDER — APIXABAN 5 MG PO TABS: 5.0000 mg | ORAL_TABLET | Freq: Two times a day (BID) | ORAL | 0 refills | Status: DC

## 2024-03-31 MED ORDER — INSULIN ASPART 100 UNIT/ML IJ SOLN
4.0000 [IU] | Freq: Three times a day (TID) | INTRAMUSCULAR | Status: DC
  Administered 2024-03-31 – 2024-04-07 (×25): 4 [IU] via SUBCUTANEOUS

## 2024-03-31 MED ORDER — APIXABAN 5 MG PO TABS
5.0000 mg | ORAL_TABLET | Freq: Two times a day (BID) | ORAL | Status: DC
  Administered 2024-03-31 – 2024-04-07 (×20): 5 mg via ORAL
  Filled 2024-03-31 (×14): qty 1

## 2024-03-31 MED ORDER — POLYETHYLENE GLYCOL 3350 17 GM/SCOOP PO POWD: 17.0000 g | Freq: Two times a day (BID) | ORAL | Status: AC | PRN

## 2024-03-31 MED ORDER — BUSPIRONE HCL 10 MG PO TABS
5.0000 mg | ORAL_TABLET | Freq: Three times a day (TID) | ORAL | Status: DC | PRN
  Administered 2024-04-01 – 2024-04-05 (×5): 5 mg via ORAL
  Filled 2024-03-31 (×4): qty 1

## 2024-03-31 MED ORDER — LEVETIRACETAM 1000 MG PO TABS: 1000.0000 mg | ORAL_TABLET | Freq: Two times a day (BID) | ORAL | Status: DC

## 2024-03-31 MED ORDER — ATORVASTATIN CALCIUM 80 MG PO TABS: 80.0000 mg | ORAL_TABLET | Freq: Every day | ORAL | Status: DC

## 2024-03-31 NOTE — H&P (Signed)
 Physical Medicine and Rehabilitation Admission H&P        Chief Complaint  Patient presents with   Functional deficits due to stroke      HPI:  Terri [name]. Terri [name] is a 48 year R handed old female with  history of DVT, bipolar d/o, depression, migraines, dizziness and weakness for a week'\, who was admitted on 03/28/24 with witnessed seizure type episode, weakness and AMS. She was found to have flaccid left hemiplegia and CT head done showed acute thrombus in both CCA with abnormal enhancement in right hemisphere and petechial hemorrhage in right middle frontal gyrus area.  She ws loaded with Keppa 2 gram followed by 1 gram BID.  Neurology recommended ruling out cause of emboli and anticoagulation benefits outweighed risks as well as tight control of BS. She was started on IV heaprin.  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 CTA chest negative for PE and hazy opacities in RLL likely combination of edema and atelectasis. BLE dopplers were negative for DVT.  2 D echo was negative for shunt but  TEE showed intrapulmonary shunt with EF 65-70% and no thrombus seen.    MRI brain done on 08/05 revealing multiple foci of T2 hyperintensity and cortical enhancement in R-MCA territory (right middle frontal gyrus, central sulcus, right temporal and parietal lobes) with associated petechial hemorrhage, remote infarct and encephalomalacia in right parietal lobe. Autoimmune panel and Hypercoagulopathy panel  negative.  EEG negative for seizures. She has been transitioned to Eliquis  with recommendations to repeat CTA head/neck in 2-3 months for follow up and decision to transition to anti-platelets if thrombus resolved. She continues to be  limited by left sided weakness with sensory deficits and  needs cues for higher level cognitive tasks.    PT/OT has been working with patient who requires min assist with ADLs and CGA with mobility. She was independent PTA and CIR recommended due to functional decline.    Pt reports having a daily HA- 2/10 currently- tylenol  helps, but usually very bothersome.  LBM 2 days ago.  Is getting OOB to void.   Also having vague abd aching sometimes- doesn't appear to be related to food.  Sleeping well  Feels very foggy since stroke- not herself.      Review of Systems  Constitutional:  Negative for fever.       Has hot/cold flashes at night  HENT:  Negative for hearing loss.   Eyes:  Positive for blurred vision (since stroke--getting better).  Respiratory:  Negative for cough and shortness of breath.   Cardiovascular:  Negative for chest pain and palpitations.  Gastrointestinal:  Positive for heartburn. Negative for constipation.       Bloating and gas  Genitourinary:  Negative for dysuria and urgency.  Musculoskeletal:  Positive for joint pain (right knee pain due to old injury).  Neurological:  Positive for sensory change (left), weakness and headaches (constant and worse since stroke).  Psychiatric/Behavioral:  The patient does not have insomnia.   All other systems reviewed and are negative.        Past Medical History:  Diagnosis Date   DEPRESSION 10/18/2009   ENDOMETRIOSIS 10/18/2009   GERD 10/18/2009   History of HPV infection     MIGRAINE HEADACHE 10/18/2009   UNSPECIFIED PERIPHERAL VASCULAR DISEASE 10/18/2009  Past Surgical History:  Procedure Laterality Date   APPENDECTOMY       BOWEL RESECTION        r/t endometriosis   OVARIAN CYST REMOVAL        bilateral   TRANSESOPHAGEAL ECHOCARDIOGRAM (CATH LAB) N/A 03/30/2024    Procedure: TRANSESOPHAGEAL ECHOCARDIOGRAM;  Surgeon: Barbaraann Darryle Ned, MD;  Location: [facility] INVASIVE CV LAB;  Service: Cardiovascular;  Laterality: N/A;               Family History   Problem Relation Age of Onset   Hyperlipidemia Mother     Diabetes Mother     Diabetes Father     Hypertension Father     Heart disease Father 51        CABG   Kidney disease Father     Prostate cancer Father            Social History:  Single. Lives with mother and independent PTA. 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 She reports that she has been smoking cigarettes--about a pack/week. She has a 7.5 pack-year smoking history. She has never used smokeless tobacco. She does not use alcohol anymore. She reports current drug use. Drug: Marijuana.     Allergies       Allergies  Allergen Reactions   Dye Fdc Blue [Brilliant Blue Fcf (Fd&C Blue #1)] Hives      MRI   Other Nausea And Vomiting      Zole      No medications prior to admission.            Home: Home Living Family/patient expects to be discharged to:: Private residence Living Arrangements: Parent (mom) Available Help at Discharge: Family, Available 24 hours/day Type of Home: House Home Access: Stairs to enter Secretary/administrator of Steps: 3 Entrance Stairs-Rails: None Home Layout: One level Bathroom Shower/Tub: Associate Professor: Yes Home Equipment: Environmental education officer Comments: unsure of accuracy of home situation as patient states that sometimes she lives with her mom but yet her mom can stay with her all the time if needed  Lives With: Family   Functional History: Prior Function Prior Level of Function : Independent/Modified Independent Mobility Comments: no AD, doesn't drive ADLs Comments: indep, uses door dash for groceries   Functional Status:  Mobility: Bed Mobility Overal bed mobility: Needs Assistance Bed Mobility: Supine to Sit, Sit to Supine Supine to sit: Contact guard Sit to supine: Contact guard assist General bed mobility comments: increased time Transfers Overall transfer level: Needs assistance Equipment  used: None Transfers: Sit to/from Stand Sit to Stand: Contact guard assist General transfer comment: From EOB with and without UE support with CGA for safety Ambulation/Gait Ambulation/Gait assistance: Contact guard assist Gait Distance (Feet): 125 Feet Assistive device: None, Quad cane Gait Pattern/deviations: Step-through pattern, Drifts right/left, Decreased stride length, Decreased stance time - left General Gait Details: Pt demonstrates short, guarded steps with LLE weakness noted, but no buckling or LOB. Pt completes first half of trial without AD and second half with quad cane. Pt demonstrates increased difficulty with sequencing with quad cane and significant decreased pace, but improves with progressed distance. Gait velocity: dec Gait velocity interpretation: <1.8 ft/sec, indicate of risk for recurrent falls Stairs: Yes Stairs assistance: Min assist Stair Management: One rail Right, No rails Number of Stairs: 4 General stair comments: Pt requires cues for sequencing. Pt initially uses 1 rail and then HHA to simulate home environment  ADL: ADL Overall ADL's : Needs assistance/impaired Eating/Feeding: Set up Grooming: Wash/dry hands, Wash/dry face, Contact guard assist, Standing Upper Body Bathing: Minimal assistance, Sitting, Standing Lower Body Bathing: Minimal assistance, Sitting/lateral leans Lower Body Bathing Details (indicate cue type and reason): able to bathe feet seated on EOB with min assist Upper Body Dressing : Minimal assistance, Sitting, Standing Upper Body Dressing Details (indicate cue type and reason): gown for back Lower Body Dressing: Minimal assistance, Sitting/lateral leans Lower Body Dressing Details (indicate cue type and reason): able to do figure 4 and changed socks with min assist Toilet Transfer: Minimal assistance, Regular Toilet, Grab bars Toilet Transfer Details (indicate cue type and reason): cues for safety Toileting- Clothing Manipulation and  Hygiene: Minimal assistance, Sit to/from stand Toileting - Clothing Manipulation Details (indicate cue type and reason): min assist for clothing management Functional mobility during ADLs: Minimal assistance   Cognition: Cognition Overall Cognitive Status: Impaired/Different from baseline Arousal/Alertness: Awake/alert Orientation Level: Oriented X4 Year: 2025 Month: August Day of Week: Correct Attention: Focused, Sustained Focused Attention: Appears intact Sustained Attention: Impaired Sustained Attention Impairment: Verbal complex Memory: Impaired Memory Impairment: Retrieval deficit, Decreased recall of new information (Immediate: 5/5; delayed: 0/5; with cues: 2/5) Awareness: Impaired Awareness Impairment: Intellectual impairment Problem Solving: Impaired Problem Solving Impairment: Verbal complex (0/2) Executive Function: Sequencing, Adult nurse, Reasoning Reasoning: Impaired Reasoning Impairment: Verbal complex Sequencing: Impaired Sequencing Impairment: Verbal complex Organizing: Impaired Organizing Impairment: Verbal complex Cognition Arousal: Alert Behavior During Therapy: WFL for tasks assessed/performed Overall Cognitive Status: Impaired/Different from baseline     Blood pressure 121/80, pulse 95, temperature 98.2 F (36.8 C), temperature source Oral, resp. rate 16, height 5' 2 (1.575 m), weight 62.1 kg, SpO2 100%. Physical Exam Vitals and nursing note reviewed.  Constitutional:      Appearance: Normal appearance. She is normal weight.     Comments: Pt sitting up slightly in bed- vague, but answering questions; awake, alert, NAD  HENT:     Head: Normocephalic and atraumatic.     Comments: Missing 1 tooth in front Oropharynx moist No facial asymmetry Tongue midline Facial sensation decreased on R face, not L face      Right Ear: External ear normal.     Left Ear: External ear normal.     Nose: Nose normal. No congestion.     Mouth/Throat:     Mouth:  Mucous membranes are moist.     Pharynx: Oropharynx is clear. No oropharyngeal exudate.  Eyes:     General:        Right eye: No discharge.        Left eye: No discharge.     Extraocular Movements: Extraocular movements intact.     Comments: Bilateral allergic shiners.   Cardiovascular:     Rate and Rhythm: Normal rate and regular rhythm.     Heart sounds: Normal heart sounds. No murmur heard.    No gallop.  Pulmonary:     Effort: Pulmonary effort is normal. No respiratory distress.     Breath sounds: Normal breath sounds. No wheezing, rhonchi or rales.  Abdominal:     Comments: Soft, but distended somewhat-  hypoactive; NT  Musculoskeletal:     Cervical back: Neck supple.     Comments: LUE 4+/5 throughout RUE 5/5 LLE- 4/5 in HF, KE, KF DF and PF RLE 5/5  Skin:    General: Skin is warm and dry.     Comments: B/L IV's in forearms- large bruise in L upper arm- likely from blood  draw?   Neurological:     Mental Status: She is alert and oriented to person, place, and time.     Comments: Speech slow. Flat affect and able to follow simple motor commands. Had difficulty recalling medical history but oriented to date, age, DOB.  Decreased to light touch and pinprick in LUE/LLE Knows at [facility] month, date and year and naming, repetition intact However memory decreased got 1/3 with cues at 5 minutes Thought next holiday was Halloween   Psychiatric:     Comments: Extremely flat- which pt said she's noticed       Lab Results Last 48 Hours        Results for orders placed or performed during the hospital encounter of 03/28/24 (from the past 48 hours)  Glucose, capillary     Status: Abnormal    Collection Time: 03/29/24  3:58 PM  Result Value Ref Range    Glucose-Capillary 224 (H) 70 - 99 mg/dL      Comment: Glucose reference range applies only to samples taken after fasting for at least 8 hours.  Heparin  level (unfractionated)     Status: Abnormal    Collection Time: 03/29/24   7:05 PM  Result Value Ref Range    Heparin  Unfractionated 0.14 (L) 0.30 - 0.70 IU/mL      Comment: (NOTE) The clinical reportable range upper limit is being lowered to >1.10 to align with the FDA approved guidance for the current laboratory assay.   If heparin  results are below expected values, and patient dosage has  been confirmed, suggest follow up testing of antithrombin III  levels. Performed at [facility], [address], [place], [place] [address]    Glucose, capillary     Status: Abnormal    Collection Time: 03/29/24  7:42 PM  Result Value Ref Range    Glucose-Capillary 264 (H) 70 - 99 mg/dL      Comment: Glucose reference range applies only to samples taken after fasting for at least 8 hours.  Glucose, capillary     Status: Abnormal    Collection Time: 03/30/24 12:05 AM  Result Value Ref Range    Glucose-Capillary 222 (H) 70 - 99 mg/dL      Comment: Glucose reference range applies only to samples taken after fasting for at least 8 hours.    Comment 1 Document in Chart    Glucose, capillary     Status: Abnormal    Collection Time: 03/30/24  4:13 AM  Result Value Ref Range    Glucose-Capillary 184 (H) 70 - 99 mg/dL      Comment: Glucose reference range applies only to samples taken after fasting for at least 8 hours.    Comment 1 Document in Chart    Comprehensive metabolic panel with GFR     Status: Abnormal    Collection Time: 03/30/24  4:53 AM  Result Value Ref Range    Sodium 137 135 - 145 mmol/L    Potassium 4.2 3.5 - 5.1 mmol/L    Chloride 108 98 - 111 mmol/L    CO2 21 (L) 22 - 32 mmol/L    Glucose, Bld 212 (H) 70 - 99 mg/dL      Comment: Glucose reference range applies only to samples taken after fasting for at least 8 hours.    BUN <5 (L) 6 - 20 mg/dL    Creatinine, Ser 9.34 0.44 - 1.00 mg/dL    Calcium  8.8 (L) 8.9 - 10.3 mg/dL  Total Protein 6.2 (L) 6.5 - 8.1 g/dL    Albumin 2.7 (L) 3.5 - 5.0 g/dL    AST 17 15 - 41 U/L    ALT 12 0 - 44 U/L     Alkaline Phosphatase 67 38 - 126 U/L    Total Bilirubin 0.6 0.0 - 1.2 mg/dL    GFR, Estimated >39 >39 mL/min      Comment: (NOTE) Calculated using the CKD-EPI Creatinine Equation (2021)      Anion gap 8 5 - 15      Comment: Performed at [facility], [address], [place], [place] [address]  Magnesium      Status: None    Collection Time: 03/30/24  4:53 AM  Result Value Ref Range    Magnesium  2.0 1.7 - 2.4 mg/dL      Comment: Performed at [facility], [address], [place], [place] [address]  Phosphorus     Status: None    Collection Time: 03/30/24  4:53 AM  Result Value Ref Range    Phosphorus 4.3 2.5 - 4.6 mg/dL      Comment: Performed at [facility], [address], [place], [place] [address]  Heparin  level (unfractionated)     Status: None    Collection Time: 03/30/24  4:53 AM  Result Value Ref Range    Heparin  Unfractionated 0.33 0.30 - 0.70 IU/mL      Comment: (NOTE) The clinical reportable range upper limit is being lowered to >1.10 to align with the FDA approved guidance for the current laboratory assay.   If heparin  results are below expected values, and patient dosage has  been confirmed, suggest follow up testing of antithrombin III  levels. Performed at [facility], [address], [place], [place] [address]    CBC     Status: None    Collection Time: 03/30/24  4:53 AM  Result Value Ref Range    WBC 9.4 4.0 - 10.5 K/uL    RBC 4.43 3.87 - 5.11 MIL/uL    Hemoglobin 12.9 12.0 - 15.0 g/dL    HCT 61.4 63.9 - 53.9 %    MCV 86.9 80.0 - 100.0 fL    MCH 29.1 26.0 - 34.0 pg    MCHC 33.5 30.0 - 36.0 g/dL    RDW 86.8 88.4 - 84.4 %    Platelets 324 150 - 400 K/uL    nRBC 0.0 0.0 - 0.2 %      Comment: Performed at [facility], [address] [place], [place] [address]  C-peptide     Status: None    Collection Time: 03/30/24  4:53 AM  Result Value Ref Range    C-Peptide 1.6 1.1 - 4.4 ng/mL      Comment: (NOTE) C-Peptide reference  interval is for fasting patients. Performed At: [facility] [address] [place], [place] [id number] Jennette Shorter MD Ey:[id number]    Glucose, capillary     Status: Abnormal    Collection Time: 03/30/24  9:47 AM  Result Value Ref Range    Glucose-Capillary 216 (H) 70 - 99 mg/dL      Comment: Glucose reference range applies only to samples taken after fasting for at least 8 hours.  Glucose, capillary     Status: Abnormal    Collection Time: 03/30/24 10:59 AM  Result Value Ref Range    Glucose-Capillary 201 (H) 70 - 99 mg/dL      Comment: Glucose reference  range applies only to samples taken after fasting for at least 8 hours.  Heparin  level (unfractionated)     Status: Abnormal    Collection Time: 03/30/24  2:18 PM  Result Value Ref Range    Heparin  Unfractionated 0.25 (L) 0.30 - 0.70 IU/mL      Comment: (NOTE) The clinical reportable range upper limit is being lowered to >1.10 to align with the FDA approved guidance for the current laboratory assay.   If heparin  results are below expected values, and patient dosage has  been confirmed, suggest follow up testing of antithrombin III  levels. Performed at [facility], [address] [place], [place] [address]    Glucose, capillary     Status: Abnormal    Collection Time: 03/30/24  4:05 PM  Result Value Ref Range    Glucose-Capillary 225 (H) 70 - 99 mg/dL      Comment: Glucose reference range applies only to samples taken after fasting for at least 8 hours.  Glucose, capillary     Status: Abnormal    Collection Time: 03/30/24  9:04 PM  Result Value Ref Range    Glucose-Capillary 172 (H) 70 - 99 mg/dL      Comment: Glucose reference range applies only to samples taken after fasting for at least 8 hours.    Comment 1 Notify RN      Comment 2 Document in Chart    Glucose, capillary     Status: Abnormal    Collection Time: 03/31/24 12:05 AM  Result Value Ref Range    Glucose-Capillary 141 (H) 70 - 99 mg/dL       Comment: Glucose reference range applies only to samples taken after fasting for at least 8 hours.    Comment 1 Notify RN      Comment 2 Document in Chart    Renal function panel     Status: Abnormal    Collection Time: 03/31/24  3:37 AM  Result Value Ref Range    Sodium 138 135 - 145 mmol/L    Potassium 4.5 3.5 - 5.1 mmol/L    Chloride 106 98 - 111 mmol/L    CO2 24 22 - 32 mmol/L    Glucose, Bld 147 (H) 70 - 99 mg/dL      Comment: Glucose reference range applies only to samples taken after fasting for at least 8 hours.    BUN 5 (L) 6 - 20 mg/dL    Creatinine, Ser 9.32 0.44 - 1.00 mg/dL    Calcium  8.8 (L) 8.9 - 10.3 mg/dL    Phosphorus 5.1 (H) 2.5 - 4.6 mg/dL    Albumin 2.7 (L) 3.5 - 5.0 g/dL    GFR, Estimated >39 >39 mL/min      Comment: (NOTE) Calculated using the CKD-EPI Creatinine Equation (2021)      Anion gap 8 5 - 15      Comment: Performed at [facility], [address], [place], [place] [address]  Magnesium      Status: None    Collection Time: 03/31/24  3:37 AM  Result Value Ref Range    Magnesium  1.9 1.7 - 2.4 mg/dL      Comment: Performed at [facility], [address], [place], [place] [address]  Glucose, capillary     Status: Abnormal    Collection Time: 03/31/24  3:54 AM  Result Value Ref Range    Glucose-Capillary 157 (H) 70 - 99 mg/dL      Comment:  Glucose reference range applies only to samples taken after fasting for at least 8 hours.    Comment 1 Notify RN      Comment 2 Document in Chart    Glucose, capillary     Status: Abnormal    Collection Time: 03/31/24  7:18 AM  Result Value Ref Range    Glucose-Capillary 129 (H) 70 - 99 mg/dL      Comment: Glucose reference range applies only to samples taken after fasting for at least 8 hours.  Glucose, capillary     Status: Abnormal    Collection Time: 03/31/24 11:37 AM  Result Value Ref Range    Glucose-Capillary 243 (H) 70 - 99 mg/dL      Comment: Glucose reference range applies only to samples  taken after fasting for at least 8 hours.       Imaging Results (Last 48 hours)  VAS US  LOWER EXTREMITY VENOUS (DVT) Result Date: 03/30/2024  Lower Venous DVT Study Patient Name:  JELENA MALICOAT  Date of Exam:   03/30/2024 Medical Rec #: [id number]            Accession #:    [id number] Date of Birth: 1976-08-15           Patient Gender: F Patient Age:   59 years Exam Location:  [facility] Procedure:      VAS US  LOWER EXTREMITY VENOUS (DVT) Referring Phys: MIGNON GONFA --------------------------------------------------------------------------------  Indications: Stroke, and Seizures.  Risk Factors: History of DVT in 2012 and left chronic SFA occlusion. Anticoagulation: Heparin . Comparison Study: No priors. Performing Technologist: Ricka Sturdivant-Jones RDMS, RVT  Examination Guidelines: A complete evaluation includes B-mode imaging, spectral Doppler, color Doppler, and power Doppler as needed of all accessible portions of each vessel. Bilateral testing is considered an integral part of a complete examination. Limited examinations for reoccurring indications may be performed as noted. The reflux portion of the exam is performed with the patient in reverse Trendelenburg.  +---------+---------------+---------+-----------+----------+--------------+ RIGHT    CompressibilityPhasicitySpontaneityPropertiesThrombus Aging +---------+---------------+---------+-----------+----------+--------------+ CFV      Full           Yes      Yes                                 +---------+---------------+---------+-----------+----------+--------------+ SFJ      Full                                                        +---------+---------------+---------+-----------+----------+--------------+ FV Prox  Full                                                        +---------+---------------+---------+-----------+----------+--------------+ FV Mid   Full                                                         +---------+---------------+---------+-----------+----------+--------------+ FV DistalFull                                                        +---------+---------------+---------+-----------+----------+--------------+  PFV      Full                                                        +---------+---------------+---------+-----------+----------+--------------+ POP      Full           Yes      Yes                                 +---------+---------------+---------+-----------+----------+--------------+ PTV      Full                                                        +---------+---------------+---------+-----------+----------+--------------+ PERO     Full                                                        +---------+---------------+---------+-----------+----------+--------------+   +---------+---------------+---------+-----------+----------+--------------+ LEFT     CompressibilityPhasicitySpontaneityPropertiesThrombus Aging +---------+---------------+---------+-----------+----------+--------------+ CFV      Full           Yes      Yes                                 +---------+---------------+---------+-----------+----------+--------------+ SFJ      Full                                                        +---------+---------------+---------+-----------+----------+--------------+ FV Prox  Full                                                        +---------+---------------+---------+-----------+----------+--------------+ FV Mid   Full                                                        +---------+---------------+---------+-----------+----------+--------------+ FV DistalFull                                                        +---------+---------------+---------+-----------+----------+--------------+ PFV      Full                                                         +---------+---------------+---------+-----------+----------+--------------+  POP      Full           Yes      Yes                                 +---------+---------------+---------+-----------+----------+--------------+ PTV      Full                                                        +---------+---------------+---------+-----------+----------+--------------+ PERO     Full                                                        +---------+---------------+---------+-----------+----------+--------------+     Summary: BILATERAL: - No evidence of deep vein thrombosis seen in the lower extremities, bilaterally. -No evidence of popliteal cyst, bilaterally.   *See table(s) above for measurements and observations. Electronically signed by Gaile New MD on 03/30/2024 at 6:10:09 PM.    Final     ECHO TEE Result Date: 03/30/2024    TRANSESOPHOGEAL ECHO REPORT   Patient Name:   BRITTIANY WIEHE Date of Exam: 03/30/2024 Medical Rec #:  [id number]           Height:       62.0 in Accession #:    [id number]          Weight:       137.0 lb Date of Birth:  1975-12-21          BSA:          1.628 m Patient Age:    47 years            BP:           118/98 mmHg Patient Gender: F                   HR:           104 bpm. Exam Location:  Inpatient Procedure: Transesophageal Echo, Cardiac Doppler, Color Doppler and 3D Echo            (Both Spectral and Color Flow Doppler were utilized during            procedure). Indications:     Stroke  History:         Patient has prior history of Echocardiogram examinations, most                  recent 03/28/2024. Stroke and Migraine.  Sonographer:     Thea Norlander RCS Referring Phys:  [id number] THOM CROME HALEY Diagnosing Phys: Darryle Decent MD PROCEDURE: After discussion of the risks and benefits of a TEE, an informed consent was obtained from the patient. TEE procedure time was 12 minutes. The transesophogeal probe was passed without difficulty through the esophogus of the  patient. Imaged were obtained with the patient in a left lateral decubitus position. Sedation performed by different physician. The patient was monitored while under deep sedation. Anesthestetic sedation was provided intravenously by Anesthesiology: 491.23mg  of Propofol , 100mg  of Lidocaine . Image quality was excellent. The patient's vital signs; including heart rate, blood  pressure, and oxygen saturation; remained stable throughout the procedure. The patient developed no complications during the procedure.  IMPRESSIONS  1. Bubble study shows crossing >6 cardiac cycles which is more consistent with intrapulmonary shunt.  2. Left ventricular ejection fraction, by estimation, is 65 to 70%. The left ventricle has normal function.  3. Right ventricular systolic function is normal. The right ventricular size is normal.  4. No left atrial/left atrial appendage thrombus was detected. The LAA emptying velocity was 67 cm/s.  5. The mitral valve is grossly normal. No evidence of mitral valve regurgitation. No evidence of mitral stenosis.  6. The aortic valve is tricuspid. Aortic valve regurgitation is not visualized. No aortic stenosis is present.  7. Agitated saline contrast bubble study was positive with shunting observed after >6 cardiac cycles suggestive of intrapulmonary shunting.  8. 3D performed of the mitral valve and demonstrates 3D mitral valve was normal. Conclusion(s)/Recommendation(s): No LA/LAA thrombus identified. Negative bubble study for interatrial shunt. No intracardiac source of embolism detected on this on this transesophageal echocardiogram. FINDINGS  Left Ventricle: Left ventricular ejection fraction, by estimation, is 65 to 70%. The left ventricle has normal function. The left ventricular internal cavity size was normal in size. Right Ventricle: The right ventricular size is normal. No increase in right ventricular wall thickness. Right ventricular systolic function is normal. Left Atrium: Left atrial  size was normal in size. No left atrial/left atrial appendage thrombus was detected. The LAA emptying velocity was 67 cm/s. Right Atrium: Right atrial size was normal in size. Pericardium: There is no evidence of pericardial effusion. Mitral Valve: The mitral valve is grossly normal. No evidence of mitral valve regurgitation. No evidence of mitral valve stenosis. Tricuspid Valve: The tricuspid valve is grossly normal. Tricuspid valve regurgitation is trivial. No evidence of tricuspid stenosis. Aortic Valve: The aortic valve is tricuspid. Aortic valve regurgitation is not visualized. No aortic stenosis is present. Pulmonic Valve: The pulmonic valve was normal in structure. Pulmonic valve regurgitation is not visualized. Aorta: The aortic root and ascending aorta are structurally normal, with no evidence of dilitation. Venous: The left upper pulmonary vein, right lower pulmonary vein, right upper pulmonary vein and left lower pulmonary vein are normal. IAS/Shunts: No atrial level shunt detected by color flow Doppler. Agitated saline contrast was given intravenously to evaluate for intracardiac shunting. Agitated saline contrast bubble study was positive with shunting observed after >6 cardiac cycles suggestive of intrapulmonary shunting. Additional Comments: 3D was performed not requiring image post processing on an independent workstation and was normal. Spectral Doppler performed. LEFT VENTRICLE PLAX 2D LVOT diam:     2.02 cm LVOT Area:     3.20 cm   AORTA Ao Root diam: 2.90 cm Ao Asc diam:  2.72 cm  SHUNTS Systemic Diam: 2.02 cm Darryle Decent MD Electronically signed by Darryle Decent MD Signature Date/Time: 03/30/2024/10:26:55 AM    Final     EP STUDY Result Date: 03/30/2024 See surgical note for result.          Blood pressure 121/80, pulse 95, temperature 98.2 F (36.8 C), temperature source Oral, resp. rate 16, height 5' 2 (1.575 m), weight 62.1 kg, SpO2 100%.   Medical Problem List and Plan: 1.  Functional deficits secondary to Subacute R MCA stroke             -patient may  shower             -ELOS/Goals: 10-12 days Supervision  Admit to CIR 2.  H/o DVT/Antithrombotics: -DVT/anticoagulation:  Pharmaceutical: Eliquis              -antiplatelet therapy: N/A 3. Pain Management: tylenol  prn. Topamax  added for constant HA- wanted to add Elavil since less memory issues, however pt has allergy that is triggered with Pamelor or Elavil-  4. Mood/Behavior/Sleep: LCSW to follow for evaluation and support.              -antipsychotic agents:  N/A 5. Neuropsych/cognition: This patient is capable of making decisions on her own behalf. 6. Skin/Wound Care: Routine pressure relief measures.  7. Fluids/Electrolytes/Nutrition: Monitor I/O.  8. T2DM: New diagnosis w/A1C-13.7. Now on insulin              --monitor BS ac/hs and use SSI for elevated BS 9. New onset seizure: On Keppra  bid 10. Elevated lipids: Chol-233, LDL 132, Trig-425. Now on Lipitor  80 mg daily.  11. H/o Mood disorder Schizaffective d/o and Bipolar written in chart- not clear since hasn't been on meds-/BPD/Depression: Not on any medications since her 20's.  --Adjustment issues now--wants something mild maybe as needed. Buspar  added 12. GERD/Bloating: Probiotic added. 13. H/o Tobacco/Marijuana use: Educate on importance of cessation             Sharlet GORMAN Schmitz, PA-C 03/31/2024     I have personally performed a face to face diagnostic evaluation of this patient and formulated the key components of the plan.  Additionally, I have personally reviewed laboratory data, imaging studies, as well as relevant notes and concur with the physician assistant's documentation above.   The patient's status has not changed from the original H&P.  Any changes in documentation from the acute care chart have been noted above.

## 2024-03-31 NOTE — Inpatient Diabetes Management (Signed)
 Inpatient Diabetes Program Recommendations  AACE/ADA: New Consensus Statement on Inpatient Glycemic Control (2015)  Target Ranges:  Prepandial:   less than 140 mg/dL      Peak postprandial:   less than 180 mg/dL (1-2 hours)      Critically ill patients:  140 - 180 mg/dL   Lab Results  Component Value Date   GLUCAP 243 (H) 03/31/2024   HGBA1C 13.7 (H) 03/29/2024    Review of Glycemic Control  Latest Reference Range & Units 03/29/24 07:47 03/29/24 11:42 03/29/24 15:58 03/29/24 19:42 03/30/24 00:05 03/30/24 04:13 03/30/24 09:47 03/30/24 10:59  Glucose-Capillary 70 - 99 mg/dL 725 (H) 779 (H) 775 (H) 264 (H) 222 (H) 184 (H) 216 (H) 201 (H)   Diabetes history: New Diabetes Diagnosis per pt  Current orders for Inpatient glycemic control:  Semglee  8 units bid Novolog  0-20 units Q4 Novolog  3 units tid meal coverage  Left sided weakness but can grip insulin  pen and pen needle  A1c 13.7% on 8/5  Spoke with pt at bedside regarding Diabetes. Demonstrated insulin  pen use. Pt with return demonstration. Discussed possible CGM at time of d/c. Will be following pt when transferred.  Rehab MD: Please place DM coordinator consult for d/c planning with new diabetes/new insulin /possible CGM start.  Thanks,  Clotilda Bull RN, MSN, BC-ADM Inpatient Diabetes Coordinator Team Pager [phone] (8a-5p)

## 2024-03-31 NOTE — Discharge Summary (Signed)
 Physician Discharge Summary  Terri [name] FMW:[id number] DOB: 12-Aug-1976 DOA: 03/28/2024  PCP: Patient, No Pcp Per  Admit date: 03/28/2024 Discharge date: 03/31/24  Admitted From: Home Disposition: Acute inpatient rehab (CIR) Recommendations for Outpatient Follow-up:  Outpatient follow-up with neurology as below Repeat CT angio head and neck in 2 to 3 months Check CMP and CBC in 1 week Please follow up on the following pending results: None   Discharge Condition: Stable CODE STATUS: Full code Diet Orders (From admission, onward)     Start     Ordered   03/31/24 1050  Diet Carb Modified Fluid consistency: Thin; Room service appropriate? Yes  Diet effective now       Question Answer Comment  Diet-HS Snack? Nothing   Calorie Level Medium 1600-2000   Fluid consistency: Thin   Room service appropriate? Yes      03/31/24 1049             Follow-up Information     Rosemarie Eather RAMAN, MD. Schedule an appointment as soon as possible for a visit in 1 month(s).   Specialties: Neurology, Radiology Why: stroke clinic Contact information: [address] [place] [address] [phone]                 Hospital course 48 year old F with PMH of poorly controlled DM-2, DVT, PAD, HTN, HLD, tobacco use disorder, migraine headache, depression and bipolar disorder presenting with left-sided weakness, altered mental status and seizure-like activity, and admitted to ICU on 8/5 with right MCA CVA, bilateral ICA thrombosis and new onset seizure.  She had witnessed seizure in ED.  Neurology consulted.  CT head with right frontal chronic and subacute infarcts with mild hemorrhagic transformation.  Did not receive TNK. CT head and neck bilateral CCA thrombus, right more than left, and multifocal abnormal enhancement in the right cerebral hemisphere, suggestive of enhancing subacute infarcts with epidural hemorrhage.  Repeat CT head stable.  MRI brain with and without  contrast consistent with multiple foci of subacute infarcts in right MCA territory.  TTE without significant finding.  LDL 132.  TG 425.  A1c 13.7%.  UDS positive for THC.  Patient was started on IV heparin .    Hypercoagulable labs without significant finding.  TTE raised concern for intrapulmonary shunting.  Lower extremity venous Doppler negative for DVT.patient's symptoms improved tremendously.  She was transition to p.o. Eliquis  per recommendation by neurology, and discharged to CIR. neurology recommends repeat CT angio head and neck in 2 to 3 months and switching to antiplatelets if the carotid thrombus resolves.   See individual problem list below for more.   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  Was on warfarin in 2012.  No longer on AC.  CT head, CT angio head and neck and MRI brain as above.  TEE shows intrapulmonary shunting.  Lower extremity venous Doppler negative.  Hypercoagulable labs negative.  Significant improvement in the symptoms. -Continue Eliquis  -Neurology recommends repeating CT angio head and neck in 2 to 3 months, and switching anticoagulation to antiplatelet if carotid thrombus resolves.  -Continue Lipitor  and glycemic control -Encourage smoking cessation. -Continue therapy at CIR   Bilateral CCA thrombus: Noted on CT angio head and neck. -Anticoagulation as above   New onset seizure: Due to CVA?  Witnessed seizure in CT.  EEG normal. -Continue Keppra  1 g twice daily -Seizure precaution -No driving for 6 months  History of provoked DVT-in 2012.  Was on warfarin.  Thought to be due to smoking and OCP at that time.  CT angio chest negative for PE.  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  Hypercoagulable labs negative.  Lower extremity venous Doppler negative. - Anticoagulation as above   Poorly  controlled NIDDM-2 with hyperglycemia: A1c 13.7%.  C-peptide normal at 1.6.  Not on medications. -Increase Semglee  from 12 to 15 units twice daily -Increase NovoLog  from 3 to 4 units 3 times daily with meals -Decrease SSI from recent to moderate scale -Carb modified diet -Further adjustment as appropriate   Mood disorder/history of depression and BPD: Stable.  Does not seem to be on meds. -Outpatient follow-up   Essential hypertension? Normotensive.  Not on meds.    Hyperlipidemia: LDL 132.  TG 425. -Now on Lipitor  80 mg daily   Tobacco use disorder: Current occasional smoker -Smoking cessation counseling provided -Pt is willing to quit   Leukocytosis/bandemia: Resolved.   Hypokalemia: Resolved  Body mass index is 25.06 kg/m.           Consultations: Neurology  Time spent 35  minutes  Vital signs Vitals:   03/30/24 2000 03/31/24 0016 03/31/24 0354 03/31/24 0716  BP: 123/80 124/81 128/80 134/84  Pulse: 88 84 90 87  Temp: 98.3 F (36.8 C) 97.6 F (36.4 C) 98.7 F (37.1 C) 97.8 F (36.6 C)  Resp: 18 18 18 17   Height:      Weight:      SpO2: 99% 100% 100% 100%  TempSrc: Oral Oral Oral Oral  BMI (Calculated):         Discharge exam  GENERAL: No apparent distress.  Nontoxic. HEENT: MMM.  Vision and hearing grossly intact.  NECK: Supple.  No apparent JVD.  RESP:  No IWOB.  Fair aeration bilaterally. CVS:  RRR. Heart sounds normal.  ABD/GI/GU: BS+. Abd soft, NTND.  MSK/EXT:  Moves extremities. No apparent deformity. No edema.  SKIN: no apparent skin lesion or wound NEURO: Awake, alert and oriented appropriately. Speech clear. Cranial nerves II-XII grossly intact. Motor 4+/5 in left upper or lower extremities and 5/5 elsewhere. Subtle left pronator drift.   Light sensation intact in all dermatomes of upper and lower ext bilaterally. Patellar reflex symmetric. Finger to nose intact. PSYCH: Calm. Normal affect.   Discharge Instructions Discharge Instructions      Ambulatory referral to Neurology   Complete by: As directed    Follow up with Dr. Rosemarie at [facility] in 4 weeks. Too complicated for NP to follow. Thanks.   Increase activity slowly   Complete by: As directed       Allergies as of 03/31/2024       Reactions   Dye Fdc Blue [brilliant Blue Fcf (fd&c Blue #1)] Hives   MRI   Other Nausea And Vomiting   Zole        Medication List     TAKE these medications    acetaminophen  325 MG tablet Commonly known as: TYLENOL  Take 2 tablets (650 mg total) by mouth every 6 (six) hours as needed for headache.   apixaban  5 MG Tabs tablet Commonly known as: ELIQUIS  Take 1 tablet (5 mg total) by mouth 2 (two) times daily.   atorvastatin  80 MG tablet Commonly known as: LIPITOR  Take 1 tablet (80 mg total) by mouth daily. Start taking on: April 01, 2024   insulin  aspart 100 UNIT/ML injection Commonly known  as: novoLOG  Inject 0-15 Units into the skin 3 (three) times daily with meals.   insulin  aspart 100 UNIT/ML injection Commonly known as: novoLOG  Inject 4 Units into the skin 3 (three) times daily with meals.   insulin  glargine-yfgn 100 UNIT/ML injection Commonly known as: SEMGLEE  Inject 0.15 mLs (15 Units total) into the skin 2 (two) times daily.   levETIRAcetam  1000 MG tablet Commonly known as: KEPPRA  Take 1 tablet (1,000 mg total) by mouth 2 (two) times daily.   polyethylene glycol powder 17 GM/SCOOP powder Commonly known as: MiraLax  Take 17 g by mouth 2 (two) times daily as needed for moderate constipation.         Procedures/Studies:   VAS US  LOWER EXTREMITY VENOUS (DVT) Result Date: 03/30/2024  Lower Venous DVT Study Patient Name:  Terri [name]  Date of Exam:   03/30/2024 Medical Rec #: [id number]            Accession #:    [id number] Date of Birth: 1976/04/20           Patient Gender: F Patient Age:   32 years Exam Location:  [facility] Procedure:      VAS US  LOWER EXTREMITY VENOUS (DVT) Referring Phys: MIGNON  Shannara Winbush --------------------------------------------------------------------------------  Indications: Stroke, and Seizures.  Risk Factors: History of DVT in 2012 and left chronic SFA occlusion. Anticoagulation: Heparin . Comparison Study: No priors. Performing Technologist: Ricka Sturdivant-Jones RDMS, RVT  Examination Guidelines: A complete evaluation includes B-mode imaging, spectral Doppler, color Doppler, and power Doppler as needed of all accessible portions of each vessel. Bilateral testing is considered an integral part of a complete examination. Limited examinations for reoccurring indications may be performed as noted. The reflux portion of the exam is performed with the patient in reverse Trendelenburg.  +---------+---------------+---------+-----------+----------+--------------+ RIGHT    CompressibilityPhasicitySpontaneityPropertiesThrombus Aging +---------+---------------+---------+-----------+----------+--------------+ CFV      Full           Yes      Yes                                 +---------+---------------+---------+-----------+----------+--------------+ SFJ      Full                                                        +---------+---------------+---------+-----------+----------+--------------+ FV Prox  Full                                                        +---------+---------------+---------+-----------+----------+--------------+ FV Mid   Full                                                        +---------+---------------+---------+-----------+----------+--------------+ FV DistalFull                                                        +---------+---------------+---------+-----------+----------+--------------+  PFV      Full                                                        +---------+---------------+---------+-----------+----------+--------------+ POP      Full           Yes      Yes                                  +---------+---------------+---------+-----------+----------+--------------+ PTV      Full                                                        +---------+---------------+---------+-----------+----------+--------------+ PERO     Full                                                        +---------+---------------+---------+-----------+----------+--------------+   +---------+---------------+---------+-----------+----------+--------------+ LEFT     CompressibilityPhasicitySpontaneityPropertiesThrombus Aging +---------+---------------+---------+-----------+----------+--------------+ CFV      Full           Yes      Yes                                 +---------+---------------+---------+-----------+----------+--------------+ SFJ      Full                                                        +---------+---------------+---------+-----------+----------+--------------+ FV Prox  Full                                                        +---------+---------------+---------+-----------+----------+--------------+ FV Mid   Full                                                        +---------+---------------+---------+-----------+----------+--------------+ FV DistalFull                                                        +---------+---------------+---------+-----------+----------+--------------+ PFV      Full                                                        +---------+---------------+---------+-----------+----------+--------------+  POP      Full           Yes      Yes                                 +---------+---------------+---------+-----------+----------+--------------+ PTV      Full                                                        +---------+---------------+---------+-----------+----------+--------------+ PERO     Full                                                         +---------+---------------+---------+-----------+----------+--------------+     Summary: BILATERAL: - No evidence of deep vein thrombosis seen in the lower extremities, bilaterally. -No evidence of popliteal cyst, bilaterally.   *See table(s) above for measurements and observations. Electronically signed by Gaile New MD on 03/30/2024 at 6:10:09 PM.    Final    ECHO TEE Result Date: 03/30/2024    TRANSESOPHOGEAL ECHO REPORT   Patient Name:   Terri [name] Date of Exam: 03/30/2024 Medical Rec #:  [id number]           Height:       62.0 in Accession #:    [id number]          Weight:       137.0 lb Date of Birth:  1975-11-03          BSA:          1.628 m Patient Age:    47 years            BP:           118/98 mmHg Patient Gender: F                   HR:           104 bpm. Exam Location:  Inpatient Procedure: Transesophageal Echo, Cardiac Doppler, Color Doppler and 3D Echo            (Both Spectral and Color Flow Doppler were utilized during            procedure). Indications:     Stroke  History:         Patient has prior history of Echocardiogram examinations, most                  recent 03/28/2024. Stroke and Migraine.  Sonographer:     Thea Norlander RCS Referring Phys:  [id number] THOM CROME HALEY Diagnosing Phys: Darryle Decent MD PROCEDURE: After discussion of the risks and benefits of a TEE, an informed consent was obtained from the patient. TEE procedure time was 12 minutes. The transesophogeal probe was passed without difficulty through the esophogus of the patient. Imaged were obtained with the patient in a left lateral decubitus position. Sedation performed by different physician. The patient was monitored while under deep sedation. Anesthestetic sedation was provided intravenously by Anesthesiology: 491.23mg  of Propofol , 100mg  of Lidocaine . Image quality was excellent. The patient's vital signs; including heart rate, blood pressure,  and oxygen saturation; remained stable throughout the procedure. The  patient developed no complications during the procedure.  IMPRESSIONS  1. Bubble study shows crossing >6 cardiac cycles which is more consistent with intrapulmonary shunt.  2. Left ventricular ejection fraction, by estimation, is 65 to 70%. The left ventricle has normal function.  3. Right ventricular systolic function is normal. The right ventricular size is normal.  4. No left atrial/left atrial appendage thrombus was detected. The LAA emptying velocity was 67 cm/s.  5. The mitral valve is grossly normal. No evidence of mitral valve regurgitation. No evidence of mitral stenosis.  6. The aortic valve is tricuspid. Aortic valve regurgitation is not visualized. No aortic stenosis is present.  7. Agitated saline contrast bubble study was positive with shunting observed after >6 cardiac cycles suggestive of intrapulmonary shunting.  8. 3D performed of the mitral valve and demonstrates 3D mitral valve was normal. Conclusion(s)/Recommendation(s): No LA/LAA thrombus identified. Negative bubble study for interatrial shunt. No intracardiac source of embolism detected on this on this transesophageal echocardiogram. FINDINGS  Left Ventricle: Left ventricular ejection fraction, by estimation, is 65 to 70%. The left ventricle has normal function. The left ventricular internal cavity size was normal in size. Right Ventricle: The right ventricular size is normal. No increase in right ventricular wall thickness. Right ventricular systolic function is normal. Left Atrium: Left atrial size was normal in size. No left atrial/left atrial appendage thrombus was detected. The LAA emptying velocity was 67 cm/s. Right Atrium: Right atrial size was normal in size. Pericardium: There is no evidence of pericardial effusion. Mitral Valve: The mitral valve is grossly normal. No evidence of mitral valve regurgitation. No evidence of mitral valve stenosis. Tricuspid Valve: The tricuspid valve is grossly normal. Tricuspid valve regurgitation is  trivial. No evidence of tricuspid stenosis. Aortic Valve: The aortic valve is tricuspid. Aortic valve regurgitation is not visualized. No aortic stenosis is present. Pulmonic Valve: The pulmonic valve was normal in structure. Pulmonic valve regurgitation is not visualized. Aorta: The aortic root and ascending aorta are structurally normal, with no evidence of dilitation. Venous: The left upper pulmonary vein, right lower pulmonary vein, right upper pulmonary vein and left lower pulmonary vein are normal. IAS/Shunts: No atrial level shunt detected by color flow Doppler. Agitated saline contrast was given intravenously to evaluate for intracardiac shunting. Agitated saline contrast bubble study was positive with shunting observed after >6 cardiac cycles suggestive of intrapulmonary shunting. Additional Comments: 3D was performed not requiring image post processing on an independent workstation and was normal. Spectral Doppler performed. LEFT VENTRICLE PLAX 2D LVOT diam:     2.02 cm LVOT Area:     3.20 cm   AORTA Ao Root diam: 2.90 cm Ao Asc diam:  2.72 cm  SHUNTS Systemic Diam: 2.02 cm Darryle Decent MD Electronically signed by Darryle Decent MD Signature Date/Time: 03/30/2024/10:26:55 AM    Final    EP STUDY Result Date: 03/30/2024 See surgical note for result.  MR BRAIN W WO CONTRAST Result Date: 03/29/2024 EXAM: MRI BRAIN WITH AND WITHOUT CONTRAST 03/28/2024 09:20:00 PM TECHNIQUE: Multiplanar multisequence MRI of the head/brain was performed with and without the administration of intravenous contrast. 7mL gadobutrol  (GADAVIST ) 1 MMOL/ML injection 7 mL GADOBUTROL  1 MMOL/ML IV SOLN. COMPARISON: CT head without contrast 03/28/2024. CT angio head and neck 03/28/2024. CT perfusion 03/28/2024. CLINICAL HISTORY: Left-sided weakness, abnormal coordination, and seizures. Stroke, follow up. FINDINGS: BRAIN AND VENTRICLES: Multiple foci of T2 hyperintensity and cortical enhancement are present in the right  MCA territory. An  area of more chronic encephalomalacia is present in the right parietal lobe with adjacent T2 signal hyperintensity. Petechial hemorrhage is demonstrated within each of the areas of cortical enhancement. Foci are present within the right middle frontal gyrus, along the central sulcus and more inferiorly in the right temporal and parietal lobes. The diffusion-weighted images demonstrate some restricted diffusion within the areas of the right parietal lobe. The largest area, the right middle frontal gyrus lesion, demonstrates no definite restricted diffusion. Effacement of the sulci adjacent to the areas of enhancement is noted. No midline shift is present. The left hemisphere is unremarkable. ORBITS: No acute abnormality. SINUSES: No acute abnormality. BONES AND SOFT TISSUES: Normal bone marrow signal and enhancement. No acute soft tissue abnormality. IMPRESSION: 1. Multiple foci of T2 hyperintensity and cortical enhancement in the right MCA territory with associated petechial hemorrhage and some restricted diffusion, consistent with subacute infarcts. The lack of restricted diffusion and extensive enhancement suggests these infarctions are greater than 48 hours old. 2. More remote infarct and Encephalomalacia of the right parietal lobe. 3. No midline shift. Electronically signed by: Lonni Necessary MD 03/29/2024 04:40 AM EDT RP Workstation: [id number]   ECHOCARDIOGRAM LIMITED BUBBLE STUDY Result Date: 03/28/2024    ECHOCARDIOGRAM LIMITED REPORT   Patient Name:   Terri [name] Date of Exam: 03/28/2024 Medical Rec #:  [id number]           Height:       62.0 in Accession #:    [id number]          Weight:       137.0 lb Date of Birth:  06-15-1976          BSA:          1.628 m Patient Age:    47 years            BP:           132/81 mmHg Patient Gender: F                   HR:           102 bpm. Exam Location:  Inpatient Procedure: Limited Echo (Both Spectral and Color Flow Doppler were utilized             during procedure). Indications:    patent foramen ovale  History:        Patient has prior history of Echocardiogram examinations, most                 recent 03/28/2024. Risk Factors:Current Smoker.  Sonographer:    Tinnie Barefoot RDCS Referring Phys: 5 MURALI RAMASWAMY  Conclusion(s)/Recommendation(s): Limited study to assess for intercardiac shunting. Limited visualization, but no large shunt seen. Consider TEE if further evaluation needed. FINDINGS  Left Ventricle: IAS/Shunts: Agitated saline contrast was given intravenously to evaluate for intracardiac shunting. Morene Brownie Electronically signed by Morene Brownie Signature Date/Time: 03/28/2024/4:45:10 PM    Final    CT HEAD WO CONTRAST ( ) Result Date: 03/28/2024 EXAM: CT HEAD WITHOUT 03/28/2024 12:50:40 PM TECHNIQUE: CT of the head was performed without the administration of intravenous contrast. Automated exposure control, iterative reconstruction, and/or weight based adjustment of the mA/kV was utilized to reduce the radiation dose to as low as reasonably achievable. COMPARISON: CT angiogram of the head and neck dated 03/28/2024 and CT of the head dated 03/28/2024. CLINICAL HISTORY: Stroke, follow up. FINDINGS: BRAIN AND VENTRICLES: No acute intracranial hemorrhage. No mass effect or midline  shift. No extra-axial fluid collection. No hydrocephalus. Encephalomalacia changes again demonstrated within the right parietal lobe. There is increased attenuation within the cortex of the right frontal lobe and obscuration of the cerebral sulci suggesting contrast staining from either regional cerebritis or ischemic tissue. ORBITS: No acute abnormality. SINUSES AND MASTOIDS: No acute abnormality. SOFT TISSUES AND SKULL: No acute skull fracture. No acute soft tissue abnormality. IMPRESSION: 1. Encephalomalacia changes in the right parietal lobe. 2. Increased attenuation in the right frontal lobe cortex and obscuration of the cerebral sulci, suggestive of  contrast staining from either regional cerebritis or ischemic tissue. Electronically signed by: evalene coho 03/28/2024 01:09 PM EDT RP Workstation: [id number]   EEG adult Result Date: 03/28/2024 Gregg Lek, MD     03/28/2024 11:42 AM Patient Name: Terri [name] MRN: [id number] Epilepsy Attending: Lek Gregg Referring Physician/Provider: No ref. provider found     Date: 03/28/2024 Duration: 23 minutes Patient history:  48 y.o. 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 EEG to rule out subclinical seizures Level of alertness: Drowsy, sleep, lethargic AEDs during EEG study: Technical aspects: This EEG study was done with scalp electrodes positioned according to the 10-20 International system of electrode placement. Electrical activity was reviewed with band pass filter of 1-70Hz , sensitivity of 7 uV/mm, display speed of 15mm/sec with a 60Hz  notched filter applied as appropriate. EEG data were recorded continuously and digitally stored.  Video monitoring was available and reviewed as appropriate. Description: The posterior dominant rhythm consists of 12-13 Hz activity of moderate voltage (25-35 uV) seen predominantly in posterior head regions, symmetric and reactive to eye opening and eye closing. Drowsiness was characterized by attenuation of the posterior background rhythm. Sleep was characterized by vertex waves, sleep spindles (12 to 14 Hz), maximal frontocentral region.  There is an excessive amount of 15 to 18 Hz, 2-3 uV beta activity with irregular morphology distributed symmetrically and diffusely. Hyperventilation and photic stimulation were not performed.   ABNORMALITY - Excessive beta, generalized IMPRESSION: This study is within normal limits. No seizures or epileptiform discharges were seen throughout the recording. The excessive beta activity seen in the background is  most likely due to the effect of benzodiazepine and is a benign EEG pattern. A normal interictal EEG does not exclude nor support the diagnosis of epilepsy. Lek Gregg MD Neurology    ECHOCARDIOGRAM COMPLETE Result Date: 03/28/2024    ECHOCARDIOGRAM REPORT   Patient Name:   Terri [name] Date of Exam: 03/28/2024 Medical Rec #:  [id number]           Height:       62.0 in Accession #:    [id number]          Weight:       137.0 lb Date of Birth:  07-23-1976          BSA:          1.628 m Patient Age:    47 years            BP:           141/119 mmHg Patient Gender: F                   HR:           120 bpm. Exam Location:  Inpatient Procedure: 2D Echo, Cardiac Doppler and Color Doppler (Both Spectral and Color  Flow Doppler were utilized during procedure). Indications:    Stroke  History:        Patient has no prior history of Echocardiogram examinations. No                 relevant cardiac history.  Sonographer:    Therisa Crouch Referring Phys: [id number] MADISON KOMMOR  Sonographer Comments: Technically difficult study due to poor echo windows. IMPRESSIONS  1. Mild intercavitary gradient noted (<58mmHg) due to hyperdynamic and underfilled LV. Left ventricular ejection fraction, by estimation, is 70 to 75%. The left ventricle has hyperdynamic function. The left ventricle has no regional wall motion abnormalities. Indeterminate diastolic filling due to E-A fusion.  2. Right ventricular systolic function is normal. The right ventricular size is normal.  3. The mitral valve is normal in structure. No evidence of mitral valve regurgitation. No evidence of mitral stenosis.  4. The aortic valve is tricuspid. Aortic valve regurgitation is not visualized. Aortic valve sclerosis is present, with no evidence of aortic valve stenosis.  5. The inferior vena cava is normal in size with greater than 50% respiratory variability, suggesting right atrial pressure of 3 mmHg. FINDINGS  Left Ventricle: Mild intercavitary  gradient noted (<56mmHg) due to hyperdynamic and underfilled LV. Left ventricular ejection fraction, by estimation, is 70 to 75%. The left ventricle has hyperdynamic function. The left ventricle has no regional wall motion abnormalities. The left ventricular internal cavity size was normal in size. There is no left ventricular hypertrophy. Indeterminate diastolic filling due to E-A fusion. Right Ventricle: The right ventricular size is normal. No increase in right ventricular wall thickness. Right ventricular systolic function is normal. Left Atrium: Left atrial size was normal in size. Right Atrium: Right atrial size was normal in size. Pericardium: There is no evidence of pericardial effusion. Mitral Valve: The mitral valve is normal in structure. No evidence of mitral valve regurgitation. No evidence of mitral valve stenosis. Tricuspid Valve: The tricuspid valve is normal in structure. Tricuspid valve regurgitation is not demonstrated. No evidence of tricuspid stenosis. Aortic Valve: The aortic valve is tricuspid. Aortic valve regurgitation is not visualized. Aortic valve sclerosis is present, with no evidence of aortic valve stenosis. Aortic valve mean gradient measures 3.0 mmHg. Aortic valve peak gradient measures 6.1  mmHg. Aortic valve area, by VTI measures 1.84 cm. Pulmonic Valve: The pulmonic valve was normal in structure. Pulmonic valve regurgitation is not visualized. No evidence of pulmonic stenosis. Aorta: The aortic root is normal in size and structure. Venous: The inferior vena cava is normal in size with greater than 50% respiratory variability, suggesting right atrial pressure of 3 mmHg. IAS/Shunts: The interatrial septum was not well visualized.  LEFT VENTRICLE PLAX 2D LVIDd:         4.40 cm LVIDs:         3.10 cm LV PW:         0.90 cm LV IVS:        1.00 cm LVOT diam:     1.80 cm LV SV:         35 LV SV Index:   21 LVOT Area:     2.54 cm  IVC IVC diam: 0.90 cm LEFT ATRIUM             Index LA  diam:        2.70 cm 1.66 cm/m LA Vol (A2C):   21.5 ml 13.21 ml/m LA Vol (A4C):   27.7 ml 17.02 ml/m LA Biplane Vol: 26.2 ml  16.10 ml/m  AORTIC VALVE AV Area (Vmax):    1.81 cm AV Area (Vmean):   1.88 cm AV Area (VTI):     1.84 cm AV Vmax:           123.00 cm/s AV Vmean:          86.900 cm/s AV VTI:            0.189 m AV Peak Grad:      6.1 mmHg AV Mean Grad:      3.0 mmHg LVOT Vmax:         87.50 cm/s LVOT Vmean:        64.200 cm/s LVOT VTI:          0.137 m LVOT/AV VTI ratio: 0.72  AORTA Ao Root diam: 3.00 cm Ao Asc diam:  2.60 cm  SHUNTS Systemic VTI:  0.14 m Systemic Diam: 1.80 cm Morene Brownie Electronically signed by Morene Brownie Signature Date/Time: 03/28/2024/8:49:23 AM    Final    CT VENOGRAM ABD/PELVIS/LOWER EXT BILAT Addendum Date: 03/28/2024  ADDENDUM #1 ADDENDUM: The above findings were discussed with Dr. Simon at 07:41 am 03/28/2024. ---------------------------------------------------- Electronically signed by: timothy berrigan 03/28/2024 07:45 AM EDT RP Workstation: [id number]   Result Date: 03/28/2024 ORIGINAL REPORT EXAM: CTA ABDOMEN AND PELVIS WITH CONTRAST AND RUNOFF CTA OF THE LOWER EXTREMITIES WITH CONTRAST 03/28/2024 07:09:15 AM TECHNIQUE: CTA images of the abdomen, pelvis and lower extremities with intravenous contrast. Three-dimensional MIP/volume rendered formations were performed. Automated exposure control, iterative reconstruction, and/or weight based adjustment of the mA/kV was utilized to reduce the radiation dose to as low as reasonably achievable. COMPARISON: None available. CLINICAL HISTORY: Deep venous thrombosis (DVT). FINDINGS: VASCULATURE: AORTA: No acute finding. No abdominal aortic aneurysm. No dissection. There is moderate calcific atheromatous disease within the aortoiliac arteries. CELIAC TRUNK: No acute finding. No occlusion or significant stenosis. SUPERIOR MESENTERIC ARTERY: No acute finding. No occlusion or significant stenosis. The mesenteric arteries are  patent. INFERIOR MESENTERIC ARTERY: No acute finding. No occlusion or significant stenosis. RENAL ARTERIES: No acute finding. No occlusion or significant stenosis. The renal arteries are patent. RIGHT ILIAC ARTERIES: No acute finding. No occlusion or significant stenosis. RIGHT FEMORAL ARTERIES: No acute finding. No occlusion or significant stenosis. RIGHT POPLITEAL ARTERY: No acute finding. No occlusion or significant stenosis. RIGHT CALF ARTERIES: No acute finding. No occlusion or significant stenosis. LEFT ILIAC ARTERIES: No acute finding. No occlusion or significant stenosis. LEFT FEMORAL ARTERIES: The left superficial femoral artery is completely occluded. LEFT POPLITEAL ARTERY: No acute finding. No occlusion or significant stenosis. LEFT CALF ARTERIES: No acute finding. No occlusion or significant stenosis. ABDOMEN AND PELVIS: LOWER CHEST: There are ground-glass and streaky opacities present within the right lower lobe and ground-glass opacities present dependently within the left lower lobe. LIVER: The liver is unremarkable. GALLBLADDER AND BILE DUCTS: Gallbladder is unremarkable. No biliary ductal dilatation. SPLEEN: The spleen is unremarkable. PANCREAS: The pancreas is unremarkable. ADRENAL GLANDS: Bilateral adrenal glands demonstrate no acute abnormality. KIDNEYS, URETERS AND BLADDER: No stones in the kidneys or ureters. No hydronephrosis. No evidence of perinephric or periureteral stranding. Urinary bladder is unremarkable. There is left renal cortical atrophy and scarring. GI AND Bowel: Stomach and duodenal sweep demonstrate no acute abnormality. There is no bowel obstruction. No abnormal bowel wall thickening or distension. The patient is status post partial colectomy. Anastomosis sutures are noted in the right pelvis. REPRODUCTIVE: Reproductive organs are unremarkable. PERITONEUM AND RETRPERITONEUM: No ascites or free air. A periumbilical fat-containing  hernia is present. LYMPH NODES: No evidence of  lymphadenopathy. BONES AND SOFT TISSUES: No acute abnormality of the bones. No acute soft tissue abnormality. VASCULATURE: The inferior vena cava and iliac veins are patent as are the femoral veins. IMPRESSION: 1. Left superficial femoral artery occlusion. 2. 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 3. Ground-glass and streaky opacities in the right lower lobe and ground-glass opacities in the left lower lobe. 4. Left renal cortical atrophy and scarring. 5. Periumbilical fat-containing hernia. 6. Status post partial colectomy with anastomosis sutures in the right pelvis. Electronically signed by: evalene coho 03/28/2024 07:33 AM EDT RP Workstation: [id number]   DG Chest Portable 1 View Result Date: 03/28/2024 EXAM: 1 VIEW XRAY OF THE CHEST 03/28/2024 07:22:00 AM COMPARISON: 07/06/2009 CLINICAL HISTORY: Concern for PNA. FINDINGS: LUNGS AND PLEURA: Vascular low lung volumes with some crowding of perihilar and bibasilar bronchovascular structures. No focal pulmonary opacity. No pulmonary edema. No pleural effusion. No pneumothorax. HEART AND MEDIASTINUM: No acute abnormality of the cardiac and mediastinal silhouettes. BONES AND SOFT TISSUES: No acute osseous abnormality. IMPRESSION: 1. No acute findings. 2. Low lung volumes with crowding of perihilar and bibasilar bronchovascular structures. Electronically signed by: Katheleen Faes MD 03/28/2024 07:27 AM EDT RP Workstation: [id number]   CT Angio Chest PE W and/or Wo Contrast Result Date: 03/28/2024 EXAM: CTA of the Chest with contrast for PE 03/28/2024 07:02:57 AM TECHNIQUE: CTA of the chest was performed after the administration of intravenous contrast. Multiplanar reformatted images are provided for review. MIP images are provided for review. Automated exposure control, iterative reconstruction, and/or weight based adjustment of the mA/kV was utilized to reduce the radiation dose to as low as reasonably achievable. COMPARISON: PA  and lateral radiographs of chest dated 07/06/2009. CLINICAL HISTORY: Pulmonary embolism (PE) suspected, high prob. FINDINGS: PULMONARY ARTERIES: Pulmonary arteries are adequately opacified for evaluation. No pulmonary embolism. Main pulmonary artery is normal in caliber. MEDIASTINUM: The heart and pericardium demonstrate no acute abnormality. There is no acute abnormality of the thoracic aorta. LYMPH NODES: No mediastinal, hilar or axillary lymphadenopathy. LUNGS AND PLEURA: There are hazy and streaky opacities present within the base of the right lower lobe, likely representing a combination of edema and atelectasis. There are also hazy opacities present in the left lung base. The nondependent lungs are clear. No pleural effusion or pneumothorax. UPPER ABDOMEN: Limited images of the upper abdomen are unremarkable. SOFT TISSUES AND BONES: No acute bone or soft tissue abnormality. IMPRESSION: 1. No evidence of pulmonary embolism. 2. Hazy and streaky opacities in the right lower lobe base, likely representing a combination of edema and atelectasis. 3. Hazy opacities in the left lung base. Electronically signed by: evalene coho 03/28/2024 07:19 AM EDT RP Workstation: [id number]   CT ANGIO HEAD NECK W WO CM W PERF (CODE STROKE) Result Date: 03/28/2024 CLINICAL DATA:  48 year old female code stroke presentation. EXAM: CT ANGIOGRAPHY HEAD AND NECK CT PERFUSION BRAIN TECHNIQUE: Multidetector CT imaging of the head and neck was performed using the standard protocol during bolus administration of intravenous contrast. Multiplanar CT image reconstructions and MIPs were obtained to evaluate the vascular anatomy. Carotid stenosis measurements (when applicable) are obtained utilizing NASCET criteria, using the distal internal carotid diameter as the denominator. Multiphase CT imaging of the brain was performed following IV bolus contrast injection. Subsequent parametric perfusion maps were calculated using RAPID software.  RADIATION DOSE REDUCTION: This exam was performed according to the departmental dose-optimization program which includes automated exposure control, adjustment of the mA  and/or kV according to patient size and/or use of iterative reconstruction technique. CONTRAST:  OMNIPAQUE  IOHEXOL  350 MG/ML SOLN COMPARISON:  Plain head CT today 0550 hours. FINDINGS: CT Brain Perfusion Findings: ASPECTS: Unclear tumor versus infarct right middle frontal gyrus. CBF (<30%) Volume: Zero, with no parameter abnormality detected. Perfusion (Tmax>6.0s) volume: Zero, with no parameter abnormality detected. Mismatch Volume: Not applicablemL Infarction Location:Not applicable CTA NECK Skeleton: Carious dentition. No acute or suspicious osseous lesion identified. Upper chest: Negative. Other neck: Nonvascular neck soft tissue spaces are within normal limits, no convincing neck mass or lymphadenopathy. Aortic arch: 4 vessel arch. Non dominant left vertebral artery arises directly from the arch. Mild calcified arch atherosclerosis is visible. Right carotid system: Streak artifact dense right subclavian venous contrast obscures the brachiocephalic artery and right CCA origin. Beginning in the right Common carotid artery at the level of the larynx there is abundant adherent low-density thrombus along the medial wall (series 12, image 110). Large volume of clot continues to the right carotid bifurcation, a distance of about 4 cm (series 14, image 52 with clot centered in the vessel on series 12, image 92. The thrombus however primarily extends into the right ECA at the bifurcation (series 12, image 88). Superimposed calcified posterior right ICA origin atherosclerosis. Right ICA is patent without stenosis to the skull base. Left carotid system: Negative left CCA origin. Positive for distal left CCA thrombus extending along a segment of about 2 cm beginning at the hypopharynx on series 2, image 97. This abates at the left carotid  bifurcation. And left ICA origin is patent without stenosis. Vertebral arteries: Obscured proximal right subclavian artery. Normal right vertebral artery origin. Dominant right vertebral artery is patent and negative to the skull base. Non dominant left vertebral artery is diminutive and arises directly from the arch. Late entry into the left transverse foramen. Left vertebral remains highly diminutive but patent to the skull base. CTA HEAD Posterior circulation: Diminutive left vertebral artery terminates in PICA. Right vertebral artery with patent PICA origin supplies the basilar. Both the right V4 segment and the basilar are diminutive but patent. No basilar stenosis identified. Fetal type PCA origins, especially the right. The basilar artery appears to terminates in patent SCA origins. Bilateral posterior communicating arteries and bilateral PCA branches are patent and within normal limits. Anterior circulation: Both ICA siphons are patent with no plaque or stenosis. The right siphon appears somewhat dominant. Both posterior communicating artery origins are normal. Patent carotid termini, MCA and ACA origins. Mildly dominant right ACA A1 segment. Diminutive or absent anterior communicating artery. Bilateral ACA branches are within normal limits. Left MCA M1 segment and bifurcation appear patent without stenosis. No left MCA branch occlusion identified. Contralateral right MCA M1 segment and bifurcation also appear patent and relatively symmetrically enhancing (series 6, image 25). No right MCA branch occlusion is identified. And bilateral MCA enhancing branch density appears fairly symmetric. Venous sinuses: Early contrast timing, the superior sagittal sinus and dominant left transverse and sigmoid venous sinuses appear patent along with the left IJ bulb. Anatomic variants: Diminutive vertebrobasilar system with dominant right and diminutive left vertebral arteries, the left arises directly from the arch and  terminates in PICA. Other findings: Delayed postcontrast images of the brain are provided and demonstrate confluent patchy gyriform type enhancement affecting much of the abnormal right middle frontal gyrus (series 10, image 25 and series 16, image 37) in an area of about 4.5 cm. This abnormal enhancement continues inferiorly to the right frontal  operculum (series 10, image 19). And there is similar abnormal patchy in gyriform enhancement at the posterior right temporal lobe, junction with the inferior right parietal lobe. Study discussed by telephone with Dr. Aisha Seals on 03/28/2024 at 06:22 . Review of the MIP images confirms the above findings IMPRESSION: 1. Constellation of acute thrombus in BOTH common carotid arteries (large volume on the Right), But no intracranial LVO. And multifocal abnormal enhancement in the right cerebral hemisphere - which is not entirely specific - but suggestive of enhancing subacute infarcts. Underlying chronic posterior right MCA territory infarct. And noncontrast Head CT suggesting petechial hemorrhage in the presume subacute right middle frontal gyrus area. 2. Differential diagnosis discussion by telephone with Dr. Aisha Seals including septic emboli (such as Endocarditis related), hypercoagulable state from malignancy, versus congenital or other benign acquired coagulopathy. Also, he advises history of DVT, so large paradoxical emboli such as from cardiac septal defect are a consideration. 3. Negative CTP. Negative visible nonvascular spaces of the neck and upper chest. Electronically Signed   By: VEAR Hurst M.D.   On: 03/28/2024 06:27   CT HEAD CODE STROKE WO CONTRAST Result Date: 03/28/2024 CLINICAL DATA:  Code stroke.  48 year old female.  Possible seizure. EXAM: CT HEAD WITHOUT CONTRAST TECHNIQUE: Contiguous axial images were obtained from the base of the skull through the vertex without intravenous contrast. RADIATION DOSE REDUCTION: This exam was performed  according to the departmental dose-optimization program which includes automated exposure control, adjustment of the mA and/or kV according to patient size and/or use of iterative reconstruction technique. COMPARISON:  None Available. FINDINGS: Brain: Abnormal right frontal lobe, MCA territory. Posterior right MCA chronic appearing cortical encephalomalacia (series 2, image 22). Anterior to that abnormal mixed density obscuration of the normal right middle frontal gyrus architecture in an area of about 4 cm, series 2, image 20 and coronal image 42. Gray and white matter affected, although vasogenic edema there is not excluded. No midline shift, ventriculomegaly, intracranial mass effect. Basilar cisterns are patent. Outside of those areas gray-white matter differentiation appears normal. Vascular: No suspicious intracranial vascular hyperdensity. Skull: Intact.  No acute osseous abnormality identified. Sinuses/Orbits: Visualized paranasal sinuses and mastoids are clear. Other: No gaze deviation. Visualized orbits and scalp soft tissues are within normal limits. ASPECTS [facility] Stroke Program Early CT Score) Total score (0-10 with 10 being normal): Unclear whether tumor versus heterogeneous infarct right middle frontal gyrus. IMPRESSION: Abnormal Right frontal lobe, with a combination of chronic posterior right MCA territory infarct (chronic encephalomalacia), but superimposed more anterior middle frontal gyrus 4 cm area of abnormal mixed density. And top differential considerations are recurrent right MCA infarct with hyperdense petechial hemorrhage, versus tumor with vasogenic edema. CTA is pending and delayed postcontrast images have been requested on that exam. Otherwise brain MRI without and with contrast would best characterize further. Study discussed by telephone with Dr. Dr. Aisha Seals on 03/28/2024 at 05:57 . Electronically Signed   By: VEAR Hurst M.D.   On: 03/28/2024 06:00       The results of  significant diagnostics from this hospitalization (including imaging, microbiology, ancillary and laboratory) are listed below for reference.     Microbiology: Recent Results (from the past 240 hours)  Blood culture (routine x 2)     Status: None (Preliminary result)   Collection Time: 03/28/24  6:31 AM   Specimen: BLOOD  Result Value Ref Range Status   Specimen Description BLOOD RIGHT ANTECUBITAL  Final   Special Requests   Final  BOTTLES DRAWN AEROBIC AND ANAEROBIC Blood Culture adequate volume   Culture   Final    NO GROWTH 3 DAYS Performed at [facility], [address] [place], [place] [address]    Report Status PENDING  Incomplete  MRSA Next Gen by PCR, Nasal     Status: None   Collection Time: 03/28/24  9:24 AM   Specimen: Nasal Mucosa; Nasal Swab  Result Value Ref Range Status   MRSA by PCR Next Gen NOT DETECTED NOT DETECTED Final    Comment: (NOTE) The GeneXpert MRSA Assay (FDA approved for NASAL specimens only), is one component of a comprehensive MRSA colonization surveillance program. It is not intended to diagnose MRSA infection nor to guide or monitor treatment for MRSA infections. Test performance is not FDA approved in patients less than 101 years old. Performed at [facility], [address] [place], [place] [address]   Blood culture (routine x 2)     Status: None (Preliminary result)   Collection Time: 03/28/24  9:33 AM   Specimen: BLOOD LEFT ARM  Result Value Ref Range Status   Specimen Description BLOOD LEFT ARM  Final   Special Requests   Final    BOTTLES DRAWN AEROBIC AND ANAEROBIC Blood Culture results may not be optimal due to an inadequate volume of blood received in culture bottles   Culture   Final    NO GROWTH 3 DAYS Performed at [facility], [address], [place], [place] [address]    Report Status PENDING  Incomplete     Labs:  CBC: Recent Labs  Lab 03/28/24 0538 03/28/24 0546 03/28/24 0548 03/29/24 0718  03/30/24 0453  WBC 14.0*  --   --  10.4 9.4  NEUTROABS 9.6*  --   --   --   --   HGB 13.7 16.0* 15.3* 13.0 12.9  HCT 40.9 47.0* 45.0 38.0 38.5  MCV 88.1  --   --  86.0 86.9  PLT 341  --   --  335 324   BMP &GFR Recent Labs  Lab 03/28/24 0524 03/28/24 0546 03/28/24 0548 03/29/24 0718 03/30/24 0453 03/31/24 0337  NA 136 138 137 136 137 138  K 3.5 3.7 3.7 3.2* 4.2 4.5  CL 99 98  --  104 108 106  CO2 24  --   --  24 21* 24  GLUCOSE 504* 500*  --  238* 212* 147*  BUN 6 7  --  <5* <5* 5*  CREATININE 0.95 0.80  --  0.49 0.65 0.67  CALCIUM  9.3  --   --  8.8* 8.8* 8.8*  MG  --   --   --  1.7 2.0 1.9  PHOS  --   --   --  4.4 4.3 5.1*   Estimated Creatinine Clearance: 77 mL/min (by C-G formula based on SCr of 0.67 mg/dL). Liver & Pancreas: Recent Labs  Lab 03/28/24 0524 03/30/24 0453 03/31/24 0337  AST 22 17  --   ALT 10 12  --   ALKPHOS 93 67  --   BILITOT 0.7 0.6  --   PROT 6.8 6.2*  --   ALBUMIN 3.2* 2.7* 2.7*   No results for input(s): LIPASE, AMYLASE in the last 168 hours. No results for input(s): AMMONIA in the last 168 hours. Diabetic: Recent Labs    03/29/24 0718  HGBA1C 13.7*   Recent Labs  Lab 03/30/24 1605 03/30/24 2104 03/31/24 0005 03/31/24 0354 03/31/24 9281  GLUCAP 225* 172* 141* 157* 129*   Cardiac Enzymes: No results for input(s): CKTOTAL, CKMB, CKMBINDEX, TROPONINI in the last 168 hours. No results for input(s): PROBNP in the last 8760 hours. Coagulation Profile: Recent Labs  Lab 03/28/24 0538  INR 0.9   Thyroid Function Tests: No results for input(s): TSH, T4TOTAL, FREET4, T3FREE, THYROIDAB in the last 72 hours. Lipid Profile: Recent Labs    03/29/24 0718  CHOL 233*  HDL 38*  LDLCALC UNABLE TO CALCULATE IF TRIGLYCERIDE OVER 400 mg/dL  TRIG 574*  CHOLHDL 6.1  LDLDIRECT 132*   Anemia Panel: No results for input(s): VITAMINB12, FOLATE, FERRITIN, TIBC, IRON, RETICCTPCT in the last 72  hours. Urine analysis:    Component Value Date/Time   COLORURINE STRAW (A) 03/28/2024 0542   APPEARANCEUR CLEAR 03/28/2024 0542   LABSPEC 1.031 (H) 03/28/2024 0542   PHURINE 7.0 03/28/2024 0542   GLUCOSEU >=500 (A) 03/28/2024 0542   HGBUR MODERATE (A) 03/28/2024 0542   BILIRUBINUR NEGATIVE 03/28/2024 0542   BILIRUBINUR n 07/22/2011 1110   KETONESUR NEGATIVE 03/28/2024 0542   PROTEINUR 30 (A) 03/28/2024 0542   UROBILINOGEN 0.2 07/22/2011 1110   NITRITE NEGATIVE 03/28/2024 0542   LEUKOCYTESUR NEGATIVE 03/28/2024 0542   Sepsis Labs: Invalid input(s): PROCALCITONIN, LACTICIDVEN   SIGNED:  Shown Dissinger T Author Hatlestad, MD  [facility] 03/31/2024, 10:50 AM

## 2024-03-31 NOTE — Plan of Care (Signed)
  Problem: Pain Managment: Goal: General experience of comfort will improve and/or be controlled Outcome: Progressing   Problem: Safety: Goal: Ability to remain free from injury will improve Outcome: Progressing   Problem: Skin Integrity: Goal: Risk for impaired skin integrity will decrease Outcome: Progressing

## 2024-03-31 NOTE — Discharge Instructions (Addendum)
  Inpatient Rehab Discharge Instructions  Terri [name] Discharge date and time: 03/31/2024  3:15 PM   Activities/Precautions/ Functional Status: Activity: no lifting, driving, or strenuous exercise  Diet: cardiac diet and diabetic diet Wound Care:    Functional status:  ___ No restrictions     ___ Walk up steps independently ___ 24/7 supervision/assistance   ___ Walk up steps with assistance ___ Intermittent supervision/assistance  ___ Bathe/dress independently ___ Walk with walker     ___ Bathe/dress with assistance ___ Walk Independently    ___ Shower independently ___ Walk with assistance    ___ Shower with assistance ___ No alcohol     ___ Return to work/school ________  Special Instructions:    My questions have been answered and I understand these instructions. I will adhere to these goals and the provided educational materials after my discharge from the hospital.  Patient/Caregiver Signature _______________________________ Date __________  Clinician Signature _______________________________________ Date __________  Please bring this form and your medication list with you to all your follow-up doctor's appointments. Information on my medicine - ELIQUIS  (apixaban )  This medication education was reviewed with me or my healthcare representative as part of my discharge preparation.   Why was Eliquis  prescribed for you? Eliquis  was prescribed to treat blood clots that may have been found in the veins of your legs (deep vein thrombosis) or in your lungs (pulmonary embolism) and to reduce the risk of them occurring again.  What do You need to know about Eliquis  ? The dose is ONE 5 mg tablet taken TWICE daily.  Eliquis  may be taken with or without food.   Try to take the dose about the same time in the morning and in the evening. If you have difficulty swallowing the tablet whole please discuss with your pharmacist how to take the medication safely.  Take Eliquis   exactly as prescribed and DO NOT stop taking Eliquis  without talking to the doctor who prescribed the medication.  Stopping may increase your risk of developing a new blood clot.  Refill your prescription before you run out.  After discharge, you should have regular check-up appointments with your healthcare provider that is prescribing your Eliquis .    What do you do if you miss a dose? If a dose of ELIQUIS  is not taken at the scheduled time, take it as soon as possible on the same day and twice-daily administration should be resumed. The dose should not be doubled to make up for a missed dose.  Important Safety Information A possible side effect of Eliquis  is bleeding. You should call your healthcare provider right away if you experience any of the following: Bleeding from an injury or your nose that does not stop. Unusual colored urine (red or dark brown) or unusual colored stools (red or black). Unusual bruising for unknown reasons. A serious fall or if you hit your head (even if there is no bleeding).  Some medicines may interact with Eliquis  and might increase your risk of bleeding or clotting while on Eliquis . To help avoid this, consult your healthcare provider or pharmacist prior to using any new prescription or non-prescription medications, including herbals, vitamins, non-steroidal anti-inflammatory drugs (NSAIDs) and supplements.  This website has more information on Eliquis  (apixaban ): [email or web address] .[email or web address] dena

## 2024-03-31 NOTE — Progress Notes (Signed)
 PMR Admission Coordinator Pre-Admission Assessment   Patient: Terri [name] is an 48 y.o., female MRN: [id number] DOB: 05/29/1976 Height: 5' 2 (157.5 cm) Weight: 62.1 kg   Insurance Information HMO:     PPO:      PCP:      IPA:      80/20:      OTHER:  PRIMARY: Uninsured   SECONDARY:       Policy#:      Phone#:    Artist:       Phone#:    The Engineer, materials Information Summary" for patients in Inpatient Rehabilitation Facilities with attached "Privacy Act Statement-Health Care Records" was provided and verbally reviewed with: N/A   Emergency Contact Information Contact Information       Name Relation Home Work Mobile    Perz,Carolyn Mother [phone]   [phone]         Other Contacts   None on File        Current Medical History  Patient Admitting Diagnosis: CVA History of Present Illness: Pt. Is a 48 year old female with PMH as significant for DVT, Bipolar, Schizoaffective disorder, and migraines who  presented to [name] ED 8/4/25with complaints of weakness and AMS. Onset of symptoms was approximately 8/3 around 2100. There were also witnessed episodes of seizure like activity (body tightening and arms jumping). Upon arrival to the ED she was found to have flaccid paralysis of the left upper and lower extremities. She was treated as a Code Stroke and was taken immediately to the CT scanner, which showed no LVO, but was positive for acute thrombus in both common carotid arteries as well as enhancement in the right cerebral hemisphere concerning for subacute infarcts. Petechial hemorrhage also seen. She was evaluated by neurology and had a witnessed seizure which was successfully abated with IV ativan . Heparin  infusion started by neurology after weighing risks considering petechial hemorrhage. CT head right frontal chronic and subacute infarcts with mild hemorrhagic transformation CT head and neck showed bilateral CCA thrombus, right more than  left.  Multifocal abnormal enhancement in the right cerebral hemisphere, suggestive of enhancing subacute infarcts with epidural hemorrhage. MRI with and without contrast showed Multiple foci of T2 hyperintensity and cortical enhancement in the right MCA territory with associated petechial hemorrhage and some restricted diffusion, consistent with subacute infarcts.  2D Echo EF 70 to 75%. Pt. Seen by PT/OT/SLP and they recommend CIR to assist return to PLOF.  Complete NIHSS TOTAL: 3   Patient's medical record from [facility]  has been reviewed by the rehabilitation admission coordinator and physician.   Past Medical History      Past Medical History:  Diagnosis Date   DEPRESSION 10/18/2009   ENDOMETRIOSIS 10/18/2009   GERD 10/18/2009   History of HPV infection     MIGRAINE HEADACHE 10/18/2009   UNSPECIFIED PERIPHERAL VASCULAR DISEASE 10/18/2009          Has the patient had major surgery during 100 days prior to admission? No   Family History   family history includes Diabetes in her father and mother; Heart disease (age of onset: 74) in her father; Hyperlipidemia in her mother; Hypertension in her father; Kidney disease in her father; Prostate cancer in her father.   Current Medications  Current Medications    Current Facility-Administered Medications:    acetaminophen  (TYLENOL ) tablet 650 mg, 650 mg, Oral, Q6H PRN, Waddell Aquas A, NP, 650 mg at 03/30/24 1147   atorvastatin  (LIPITOR ) tablet  80 mg, 80 mg, Oral, Daily, Jerri Pfeiffer, MD, 80 mg at 03/30/24 1018   Chlorhexidine  Gluconate Cloth 2 % PADS 6 each, 6 each, Topical, Daily, Rosan Deward ORN, NP, 6 each at 03/29/24 9078   heparin  ADULT infusion 100 units/mL (25000 units/250mL), 1,150 Units/hr, Intravenous, Continuous, Billy Rocky SAUNDERS, RPH, Last Rate: 11.5 mL/hr at 03/30/24 1327, 1,150 Units/hr at 03/30/24 1327   insulin  aspart (novoLOG ) injection 0-20 Units, 0-20 Units, Subcutaneous, Q4H, Ramaswamy, Murali, MD, 7  Units at 03/30/24 1141   insulin  aspart (novoLOG ) injection 3 Units, 3 Units, Subcutaneous, TID WC, Gonfa, Taye T, MD, 3 Units at 03/30/24 1142   insulin  glargine-yfgn (SEMGLEE ) injection 8 Units, 8 Units, Subcutaneous, BID, Hoffman, Paul W, NP, 8 Units at 03/30/24 1018   levETIRAcetam  (KEPPRA ) tablet 1,000 mg, 1,000 mg, Oral, BID, Geronimo Amel, MD, 1,000 mg at 03/30/24 1018   ondansetron  (ZOFRAN ) injection 4 mg, 4 mg, Intravenous, Q6H PRN, Jerri Pfeiffer, MD, 4 mg at 03/29/24 1824   polyethylene glycol (MIRALAX  / GLYCOLAX ) packet 17 g, 17 g, Oral, Daily PRN, Rosan Deward ORN, NP     Patients Current Diet:  Diet Order                  Diet regular Room service appropriate? Yes; Fluid consistency: Thin  Diet effective now                         Precautions / Restrictions Precautions Precautions: Fall Restrictions Weight Bearing Restrictions Per Provider Order: No    Has the patient had 2 or more falls or a fall with injury in the past year? Yes   Prior Activity Level Community (5-7x/wk): Pt. active in the community PTA   Prior Functional Level Self Care: Did the patient need help bathing, dressing, using the toilet or eating? Independent   Indoor Mobility: Did the patient need assistance with walking from room to room (with or without device)? Independent   Stairs: Did the patient need assistance with internal or external stairs (with or without device)? Independent   Functional Cognition: Did the patient need help planning regular tasks such as shopping or remembering to take medications? Independent   Patient Information Are you of Hispanic, Latino/a,or Spanish origin?: A. No, not of Hispanic, Latino/a, or Spanish origin What is your race?: B. Black or African American Do you need or want an interpreter to communicate with a doctor or health care staff?: 1. Yes   Patient's Response To:  Health Literacy and Transportation Is the patient able to respond to health  literacy and transportation needs?: Yes Health Literacy - How often do you need to have someone help you when you read instructions, pamphlets, or other written material from your doctor or pharmacy?: Never In the past 12 months, has lack of transportation kept you from medical appointments or from getting medications?: No In the past 12 months, has lack of transportation kept you from meetings, work, or from getting things needed for daily living?: No   Home Assistive Devices / Equipment Home Equipment: Shower seat   Prior Device Use: Indicate devices/aids used by the patient prior to current illness, exacerbation or injury? None of the above   Current Functional Level Cognition   Arousal/Alertness: Awake/alert Overall Cognitive Status: Impaired/Different from baseline Orientation Level: Oriented X4 Attention: Focused, Sustained Focused Attention: Appears intact Sustained Attention: Impaired Sustained Attention Impairment: Verbal complex Memory: Impaired Memory Impairment: Retrieval deficit, Decreased recall of new information (Immediate:  5/5; delayed: 0/5; with cues: 2/5) Awareness: Impaired Awareness Impairment: Intellectual impairment Problem Solving: Impaired Problem Solving Impairment: Verbal complex (0/2) Executive Function: Sequencing, Adult nurse, Reasoning Reasoning: Impaired Reasoning Impairment: Verbal complex Sequencing: Impaired Sequencing Impairment: Verbal complex Organizing: Impaired Organizing Impairment: Verbal complex    Extremity Assessment (includes Sensation/Coordination)   Upper Extremity Assessment: LUE deficits/detail LUE Deficits / Details: 3+/5 compared to RUE, decr coordinaton  Lower Extremity Assessment: Defer to PT evaluation LLE Deficits / Details: grossly 3-/5, impaired coordination     ADLs   Overall ADL's : Needs assistance/impaired Eating/Feeding: Set up Grooming: Contact guard assist, Standing, Oral care Upper Body Bathing: Minimal  assistance, Sitting, Standing Lower Body Bathing: Moderate assistance, Sit to/from stand, Sitting/lateral leans Upper Body Dressing : Minimal assistance, Sitting, Standing Lower Body Dressing: Moderate assistance, Sitting/lateral leans, Sit to/from stand Toilet Transfer: Minimal assistance, Ambulation Functional mobility during ADLs: Minimal assistance     Mobility   Overal bed mobility: Needs Assistance Bed Mobility: Supine to Sit Supine to sit: Min assist General bed mobility comments: increased time, minA for trunk, pt guarded, assist for line management     Transfers   Overall transfer level: Needs assistance Equipment used: 1 person hand held assist Transfers: Sit to/from Stand Sit to Stand: Min assist General transfer comment: minA to power up and steady, pt guarded and cautious reporting i'm definately unsteady, wide base of support     Ambulation / Gait / Stairs / Wheelchair Mobility   Ambulation/Gait Ambulation/Gait assistance: Min assist, Mod assist Gait Distance (Feet): 100 Feet Assistive device: IV Pole Gait Pattern/deviations: Step-to pattern, Decreased stride length, Decreased dorsiflexion - left, Decreased weight shift to left, Knee flexed in stance - left General Gait Details: pt with noted L knee instability, decreased step length, difficulty keeping L hand on IV pole due to weak grip. Pt with L sided antalgia, pt with increased L knee flexion during ambulation with onset of fatigue Gait velocity: dec Gait velocity interpretation: <1.8 ft/sec, indicate of risk for recurrent falls     Posture / Balance Balance Overall balance assessment: Needs assistance Sitting-balance support: Feet supported, No upper extremity supported Sitting balance-Leahy Scale: Fair Standing balance support: Bilateral upper extremity supported, During functional activity, Reliant on assistive device for balance Standing balance-Leahy Scale: Fair Standing balance comment: static standing  without AD     Special considerations/life events  Behavioral consideration hx of schizoaffective and biploar disorders    Previous Home Environment (from acute therapy documentation) Living Arrangements: Parent (mom)  Lives With: Family Available Help at Discharge: Family, Available 24 hours/day Type of Home: House Home Layout: One level Home Access: Stairs to enter Entrance Stairs-Rails: None Entrance Stairs-Number of Steps: 3 Bathroom Shower/Tub: Associate Professor: Yes How Accessible: Accessible via walker Home Care Services: No Additional Comments: unsure of accuracy of home situation as patient states that sometimes she lives with her mom but yet her mom can stay with her all the time if needed   Discharge Living Setting Plans for Discharge Living Setting: Patient's home Type of Home at Discharge: House Discharge Home Layout: One level Discharge Home Access: Stairs to enter Entrance Stairs-Rails: None Entrance Stairs-Number of Steps: 3 Discharge Bathroom Shower/Tub: Tub/shower unit Discharge Bathroom Toilet: Standard Discharge Bathroom Accessibility: Yes How Accessible: Accessible via walker Does the patient have any problems obtaining your medications?: No   Social/Family/Support Systems Patient Roles: Parent Contact Information: [phone] Anticipated Caregiver's Contact Information: 24/7 min A Caregiver Availability: 24/7 Discharge Plan Discussed with  Primary Caregiver: Yes Is Caregiver In Agreement with Plan?: No Does Caregiver/Family have Issues with Lodging/Transportation while Pt is in Rehab?: No   Goals Patient/Family Goal for Rehab: PT/OT/SLP Supervision Expected length of stay: 10-12 days Pt/Family Agrees to Admission and willing to participate: Yes Program Orientation Provided & Reviewed with Pt/Caregiver Including Roles  & Responsibilities: Yes   Decrease burden of Care through IP rehab admission: not  anticipated   Possible need for SNF placement upon discharge: Not anticipated   Patient Condition: I have reviewed medical records from [facility], spoken with CM, and family member. I met with patient at the bedside for inpatient rehabilitation assessment.  Patient will benefit from ongoing PT, OT, and SLP, can actively participate in 3 hours of therapy a day 5 days of the week, and can make measurable gains during the admission.  Patient will also benefit from the coordinated team approach during an Inpatient Acute Rehabilitation admission.  The patient will receive intensive therapy as well as Rehabilitation physician, nursing, social worker, and care management interventions.  Due to safety, skin/wound care, disease management, medication administration, pain management, and patient education the patient requires 24 hour a day rehabilitation nursing.  The patient is currently min A  with mobility and basic ADLs.  Discharge setting and therapy post discharge at home with home health is anticipated.  Patient has agreed to participate in the Acute Inpatient Rehabilitation Program and will admit today.   Preadmission Screen Completed By:  Leita KATHEE Kleine, 03/30/2024 1:35 PM ______________________________________________________________________   Discussed status with Dr. Lovorn on 03/31/24 at 900 and received approval for admission today.   Admission Coordinator:  Leita KATHEE Kleine, CCC-SLP, time 1110/Date 03/31/24    Assessment/Plan: Diagnosis: R MCA infarct Does the need for close, 24 hr/day Medical supervision in concert with the patient's rehab needs make it unreasonable for this patient to be served in a less intensive setting? Yes Co-Morbidities requiring supervision/potential complications:  Schizoaffective d/o; Bipolar d/o;  migraines, DVT; seizures Due to bladder management, bowel management, safety, skin/wound care, disease management, medication administration, pain management, and  patient education, does the patient require 24 hr/day rehab nursing? Yes Does the patient require coordinated care of a physician, rehab nurse, PT, OT, and SLP to address physical and functional deficits in the context of the above medical diagnosis(es)? Yes Addressing deficits in the following areas: balance, endurance, locomotion, strength, transferring, bowel/bladder control, bathing, dressing, feeding, grooming, toileting, cognition, speech, and language Can the patient actively participate in an intensive therapy program of at least 3 hrs of therapy 5 days a week? Yes The potential for patient to make measurable gains while on inpatient rehab is good Anticipated functional outcomes upon discharge from inpatient rehab: supervision PT, supervision OT, supervision SLP Estimated rehab length of stay to reach the above functional goals is: 10-12 days Anticipated discharge destination: Home 10. Overall Rehab/Functional Prognosis: good     MD Signature:

## 2024-03-31 NOTE — Progress Notes (Signed)
 Occupational Therapy Treatment Patient Details Name: Terri [name] MRN: [id number] DOB: 1976/02/26 Today's Date: 03/31/2024   History of present illness 48 year old female presented to [facility] ED 8/4 with complaints of L sided weakness and AMS. 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 PMH: DVT, Bipolar, Schizoaffective disorder, and migraines   OT comments  Patient demonstrating good gains with OT treatment. Patient requiring min assist to get to EOB and to perform mobility in room for standing at sink for self care and toilet transfers with one person HHA. Patient able to perform toilet hygiene seated and min assist for clothing management when standing. Patient will benefit from intensive inpatient follow-up therapy, >3 hours/day.Acute.  OT to continue to follow to address established goals to facilitate DC to next venue of care.        If plan is discharge home, recommend the following:  A little help with walking and/or transfers;A lot of help with bathing/dressing/bathroom;Direct supervision/assist for medications management;Direct supervision/assist for financial management;Assist for transportation;Help with stairs or ramp for entrance;Assistance with cooking/housework   Equipment Recommendations  None recommended by OT    Recommendations for Other Services      Precautions / Restrictions Precautions Precautions: Fall Restrictions Weight Bearing Restrictions Per Provider Order: No       Mobility Bed Mobility Overal bed mobility: Needs Assistance Bed Mobility: Supine to Sit     Supine to sit: Min assist     General bed mobility comments: increased time and min assist with trunk    Transfers Overall transfer level: Needs assistance Equipment used: 1 person hand held assist Transfers: Sit to/from Stand Sit to Stand: Min assist           General transfer comment: min  assist to power up and for support when standing     Balance Overall balance assessment: Needs assistance Sitting-balance support: Feet supported, No upper extremity supported Sitting balance-Leahy Scale: Fair     Standing balance support: Single extremity supported, During functional activity Standing balance-Leahy Scale: Fair Standing balance comment: stood at sink with support from sink with one extremity                           ADL either performed or assessed with clinical judgement   ADL Overall ADL's : Needs assistance/impaired     Grooming: Wash/dry hands;Wash/dry face;Contact guard assist;Standing       Lower Body Bathing: Minimal assistance;Sitting/lateral leans Lower Body Bathing Details (indicate cue type and reason): able to bathe feet seated on EOB with min assist Upper Body Dressing : Minimal assistance;Sitting;Standing Upper Body Dressing Details (indicate cue type and reason): gown for back Lower Body Dressing: Minimal assistance;Sitting/lateral leans Lower Body Dressing Details (indicate cue type and reason): able to do figure 4 and changed socks with min assist Toilet Transfer: Minimal assistance;Regular Toilet;Grab bars Toilet Transfer Details (indicate cue type and reason): cues for safety Toileting- Clothing Manipulation and Hygiene: Minimal assistance;Sit to/from stand Toileting - Clothing Manipulation Details (indicate cue type and reason): min assist for clothing management     Functional mobility during ADLs: Minimal assistance      Extremity/Trunk Assessment              Vision       Perception     Praxis     Communication Communication Communication: No apparent difficulties   Cognition Arousal:  Alert Behavior During Therapy: WFL for tasks assessed/performed Cognition: No apparent impairments                               Following commands: Intact        Cueing   Cueing Techniques: Verbal cues   Exercises      Shoulder Instructions       General Comments VSS on RA    Pertinent Vitals/ Pain       Pain Assessment Pain Assessment: No/denies pain  Home Living                                          Prior Functioning/Environment              Frequency  Min 2X/week        Progress Toward Goals  OT Goals(current goals can now be found in the care plan section)  Progress towards OT goals: Progressing toward goals  Acute Rehab OT Goals Patient Stated Goal: none stated OT Goal Formulation: With patient Time For Goal Achievement: 04/12/24 Potential to Achieve Goals: Good ADL Goals Pt Will Perform Upper Body Dressing: with supervision;sitting Pt Will Perform Lower Body Dressing: with supervision;sit to/from stand;sitting/lateral leans Pt Will Transfer to Toilet: with supervision;ambulating;regular height toilet Additional ADL Goal #1: Pt will perform higher level cognitive task with min error in prep for ADL/IADL  Plan      Co-evaluation                 AM-PAC OT 6 Clicks Daily Activity     Outcome Measure   Help from another person eating meals?: A Little Help from another person taking care of personal grooming?: A Little Help from another person toileting, which includes using toliet, bedpan, or urinal?: A Little Help from another person bathing (including washing, rinsing, drying)?: A Little Help from another person to put on and taking off regular upper body clothing?: A Little Help from another person to put on and taking off regular lower body clothing?: A Little 6 Click Score: 18    End of Session Equipment Utilized During Treatment: Gait belt  OT Visit Diagnosis: Unsteadiness on feet (R26.81);Other abnormalities of gait and mobility (R26.89);Muscle weakness (generalized) (M62.81)   Activity Tolerance Patient tolerated treatment well   Patient Left in chair;with call bell/phone within reach;with chair alarm set    Nurse Communication Mobility status        Time: 9250-9186 OT Time Calculation (min): 24 min  Charges: OT General Charges $OT Visit: 1 Visit OT Treatments $Self Care/Home Management : 23-37 mins  Dick Laine, OTA Acute Rehabilitation Services  Office [phone]   Jeb LITTIE Laine 03/31/2024, 9:34 AM

## 2024-03-31 NOTE — TOC Transition Note (Signed)
 Transition of Care [facility]) - Discharge Note   Patient Details  Name: Terri [name] MRN: [id number] Date of Birth: [birth year]  Transition of Care [facility]) CM/SW Contact:  Andrez JULIANNA George, RN Phone Number: 03/31/2024, 1:11 PM   Clinical Narrative:     Pt is discharging to CIR today. CM provided CIR admissions coordinator with forms for Eliquis  pt assistance. They will continue with filling them out and send them in at d/c from IR.  No further needs per IP Care Management.   Final next level of care: IP Rehab Facility Barriers to Discharge: Inadequate or no insurance, Barriers Unresolved (comment)   Patient Goals and CMS Choice     Choice offered to / list presented to : Patient      Discharge Placement                       Discharge Plan and Services Additional resources added to the After Visit Summary for                                       Social Drivers of Health (SDOH) Interventions SDOH Screenings   Food Insecurity: Food Insecurity Present (03/30/2024)  Housing: Unknown (03/30/2024)  Transportation Needs: No Transportation Needs (03/30/2024)  Utilities: Not At Risk (03/30/2024)  Alcohol Screen: Low Risk  (07/08/2019)  Tobacco Use: High Risk (03/30/2024)     Readmission Risk Interventions     No data to display

## 2024-03-31 NOTE — Progress Notes (Signed)
 Inpatient Rehabilitation Admission Medication Review by a Pharmacist  A complete drug regimen review was completed for this patient to identify any potential clinically significant medication issues.  High Risk Drug Classes Is patient taking? Indication by Medication  Antipsychotic Yes Compazine  prn N/V  Anticoagulant Yes Apixaban  - stroke  Antibiotic No   Opioid No   Antiplatelet No   Hypoglycemics/insulin  Yes Insulin  - DM  Vasoactive Medication No   Chemotherapy No   Other Yes Trazodone  prn sleep Atorvastatin  - HLD Levetiracetam  - seizure     Type of Medication Issue Identified Description of Issue Recommendation(s)  Drug Interaction(s) (clinically significant)     Duplicate Therapy     Allergy     No Medication Administration End Date     Incorrect Dose     Additional Drug Therapy Needed     Significant med changes from prior encounter (inform family/care partners about these prior to discharge).    Other       Clinically significant medication issues were identified that warrant physician communication and completion of prescribed/recommended actions by midnight of the next day:  No  Name of provider notified for urgent issues identified:   Provider Method of Notification:     Pharmacist comments: No medications prior to admission  Time spent performing this drug regimen review (minutes):  30 minutes  Thank you. Olam Monte, PharmD

## 2024-03-31 NOTE — Progress Notes (Signed)
 Report called and given to Almeda, RN on 4W CIR. PIVs removed prior to transfer per request. Pt transferred to 4w 22 with all belongings.

## 2024-03-31 NOTE — Progress Notes (Signed)
   03/31/24 1600  Spiritual Encounters  Type of Visit Initial  Care provided to: Pt and family  Referral source Nurse (RN/NT/LPN)  Reason for visit Advance directives  OnCall Visit No  Interventions  Spiritual Care Interventions Made Established relationship of care and support;Compassionate presence;Reflective listening;Encouragement  Intervention Outcomes  Outcomes Awareness of support  Spiritual Care Plan  Spiritual Care Issues Still Outstanding No further spiritual care needs at this time (see row info)   Chaplain responded to Spiritual Consult for Advance Care Directive.  Chaplain arrived in room and delivered ACD education. Instructed Pt how to reach out to Spiritual Care if they have any questions and to contact us  when they are ready to move forward.  Chaplain services remain available as the need arises by Spiritual Consult or for emergent cases, paging [phone].  Chaplain Katharin Schneider, LPN, CHC, ORDM

## 2024-03-31 NOTE — Progress Notes (Signed)
 Physical Therapy Treatment Patient Details Name: Terri [name] MRN: [id number] DOB: 04/20/1976 Today's Date: 03/31/2024   History of Present Illness 48 year old female presented to [facility] ED 8/4 with complaints of L sided weakness and AMS. 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 PMH: DVT, Bipolar, Schizoaffective disorder, and migraines    PT Comments  Pt received in supine and agreeable to session. Pt able to demonstrate good progress towards functional mobility goals this session. Pt able to complete gait trial with and without AD with CGA for safety. Pt demonstrates instability without AD, but difficulty sequencing with quad cane. Pt continues to be limited by impaired balance and weakness and will benefit from intensive inpatient follow-up therapy, >3 hours/day to maximize progress towards functional mobility goals. Acutely, pt continues to benefit from PT services to progress toward functional mobility goals.    If plan is discharge home, recommend the following: A little help with walking and/or transfers;A little help with bathing/dressing/bathroom;Assist for transportation;Help with stairs or ramp for entrance   Can travel by private vehicle        Equipment Recommendations   (TBD)    Recommendations for Other Services       Precautions / Restrictions Precautions Precautions: Fall Restrictions Weight Bearing Restrictions Per Provider Order: No     Mobility  Bed Mobility Overal bed mobility: Needs Assistance Bed Mobility: Supine to Sit, Sit to Supine     Supine to sit: Contact guard Sit to supine: Contact guard assist   General bed mobility comments: increased time    Transfers Overall transfer level: Needs assistance Equipment used: None Transfers: Sit to/from Stand Sit to Stand: Contact guard assist           General transfer comment: From EOB with and  without UE support with CGA for safety    Ambulation/Gait Ambulation/Gait assistance: Contact guard assist Gait Distance (Feet): 125 Feet Assistive device: None, Quad cane Gait Pattern/deviations: Step-through pattern, Drifts right/left, Decreased stride length, Decreased stance time - left Gait velocity: dec     General Gait Details: Pt demonstrates short, guarded steps with LLE weakness noted, but no buckling or LOB. Pt completes first half of trial without AD and second half with quad cane. Pt demonstrates increased difficulty with sequencing with quad cane and significant decreased pace, but improves with progressed distance.   Stairs Stairs: Yes Stairs assistance: Min assist Stair Management: One rail Right, No rails Number of Stairs: 4 General stair comments: Pt requires cues for sequencing. Pt initially uses 1 rail and then HHA to simulate home environment   Wheelchair Mobility     Tilt Bed    Modified Rankin (Stroke Patients Only) Modified Rankin (Stroke Patients Only) Pre-Morbid Rankin Score: Slight disability Modified Rankin: Moderately severe disability     Balance Overall balance assessment: Needs assistance Sitting-balance support: Feet supported, No upper extremity supported Sitting balance-Leahy Scale: Fair     Standing balance support: Single extremity supported, During functional activity Standing balance-Leahy Scale: Fair Standing balance comment: with and without AD                            Communication Communication Communication: No apparent difficulties  Cognition Arousal: Alert Behavior During Therapy: WFL for tasks assessed/performed   PT - Cognitive impairments: No apparent impairments  Following commands: Intact      Cueing Cueing Techniques: Verbal cues  Exercises Other Exercises Other Exercises: x10 serial STS without UE support Other Exercises: x10 LLE step ups    General  Comments General comments (skin integrity, edema, etc.): VSS on RA      Pertinent Vitals/Pain Pain Assessment Pain Assessment: No/denies pain     PT Goals (current goals can now be found in the care plan section) Acute Rehab PT Goals Patient Stated Goal: indep PT Goal Formulation: With patient Time For Goal Achievement: 04/12/24 Progress towards PT goals: Progressing toward goals    Frequency    Min 4X/week       AM-PAC PT 6 Clicks Mobility   Outcome Measure  Help needed turning from your back to your side while in a flat bed without using bedrails?: A Little Help needed moving from lying on your back to sitting on the side of a flat bed without using bedrails?: A Little Help needed moving to and from a bed to a chair (including a wheelchair)?: A Little Help needed standing up from a chair using your arms (e.g., wheelchair or bedside chair)?: A Little Help needed to walk in hospital room?: A Little Help needed climbing 3-5 steps with a railing? : A Little 6 Click Score: 18    End of Session Equipment Utilized During Treatment: Gait belt Activity Tolerance: Patient tolerated treatment well Patient left: with call bell/phone within reach;in bed;with bed alarm set Nurse Communication: Mobility status PT Visit Diagnosis: Unsteadiness on feet (R26.81);Muscle weakness (generalized) (M62.81);Difficulty in walking, not elsewhere classified (R26.2);Hemiplegia and hemiparesis Hemiplegia - Right/Left: Left Hemiplegia - dominant/non-dominant: Non-dominant     Time: 9094-9069 PT Time Calculation (min) (ACUTE ONLY): 25 min  Charges:    $Gait Training: 8-22 mins $Therapeutic Exercise: 8-22 mins PT General Charges $$ ACUTE PT VISIT: 1 Visit                     Darryle George, PTA Acute Rehabilitation Services Secure Chat Preferred  [phone]    Darryle George 03/31/2024, 10:22 AM

## 2024-03-31 NOTE — H&P (Signed)
 Physical Medicine and Rehabilitation Admission H&P    Chief Complaint  Patient presents with   Functional deficits due to stroke    HPI:  Terri [name]. Terri [name] is a 48 year R handed old female with  history of DVT, bipolar d/o, depression, migraines, dizziness and weakness for a week'\, who was admitted on 03/28/24 with witnessed seizure type episode, weakness and AMS. She was found to have flaccid left hemiplegia and CT head done showed acute thrombus in both CCA with abnormal enhancement in right hemisphere and petechial hemorrhage in right middle frontal gyrus area.  She ws loaded with Keppa 2 gram followed by 1 gram BID.  Neurology recommended ruling out cause of emboli and anticoagulation benefits outweighed risks as well as tight control of BS. She was started on IV heaprin.  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 CTA chest negative for PE and hazy opacities in RLL likely combination of edema and atelectasis. BLE dopplers were negative for DVT.  2 D echo was negative for shunt but  TEE showed intrapulmonary shunt with EF 65-70% and no thrombus seen.   MRI brain done on 08/05 revealing multiple foci of T2 hyperintensity and cortical enhancement in R-MCA territory (right middle frontal gyrus, central sulcus, right temporal and parietal lobes) with associated petechial hemorrhage, remote infarct and encephalomalacia in right parietal lobe. Autoimmune panel and Hypercoagulopathy panel  negative.  EEG negative for seizures. She has been transitioned to Eliquis  with recommendations to repeat CTA head/neck in 2-3 months for follow up and decision to transition to anti-platelets if thrombus resolved. She continues to be  limited by left sided weakness with sensory deficits and needs cues  for higher level cognitive tasks.   PT/OT has been working with patient who requires min assist with ADLs and CGA with mobility. She was independent PTA and CIR recommended due to functional decline.   Pt reports having a daily HA- 2/10 currently- tylenol  helps, but usually very bothersome.  LBM 2 days ago.  Is getting OOB to void.  Also having vague abd aching sometimes- doesn't appear to be related to food.  Sleeping well  Feels very foggy since stroke- not herself.    Review of Systems  Constitutional:  Negative for fever.       Has hot/cold flashes at night  HENT:  Negative for hearing loss.   Eyes:  Positive for blurred vision (since stroke--getting better).  Respiratory:  Negative for cough and shortness of breath.   Cardiovascular:  Negative for chest pain and palpitations.  Gastrointestinal:  Positive for heartburn. Negative for constipation.       Bloating and gas  Genitourinary:  Negative for dysuria and urgency.  Musculoskeletal:  Positive for joint pain (right knee pain due to old injury).  Neurological:  Positive for sensory change (left), weakness and headaches (constant and worse since stroke).  Psychiatric/Behavioral:  The patient does not have insomnia.   All other systems reviewed and are negative.   Past Medical History:  Diagnosis Date   DEPRESSION 10/18/2009   ENDOMETRIOSIS 10/18/2009   GERD 10/18/2009   History of HPV infection    MIGRAINE HEADACHE 10/18/2009   UNSPECIFIED PERIPHERAL VASCULAR DISEASE 10/18/2009    Past Surgical History:  Procedure Laterality Date   APPENDECTOMY     BOWEL RESECTION  r/t endometriosis   OVARIAN CYST REMOVAL     bilateral   TRANSESOPHAGEAL ECHOCARDIOGRAM (CATH LAB) N/A 03/30/2024   Procedure: TRANSESOPHAGEAL ECHOCARDIOGRAM;  Surgeon: Barbaraann Darryle Ned, MD;  Location: [facility] INVASIVE CV LAB;  Service: Cardiovascular;  Laterality: N/A;    Family History  Problem Relation Age of Onset   Hyperlipidemia Mother     Diabetes Mother    Diabetes Father    Hypertension Father    Heart disease Father 75       CABG   Kidney disease Father    Prostate cancer Father     Social History:  Single. Lives with mother and independent PTA. 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 She reports that she has been smoking cigarettes--about a pack/week. She has a 7.5 pack-year smoking history. She has never used smokeless tobacco. She does not use alcohol anymore. She reports current drug use. Drug: Marijuana.   Allergies  Allergen Reactions   Dye Fdc Blue [Brilliant Blue Fcf (Fd&C Blue #1)] Hives    MRI   Other Nausea And Vomiting    Zole   No medications prior to admission.     Home: Home Living Family/patient expects to be discharged to:: Private residence Living Arrangements: Parent (mom) Available Help at Discharge: Family, Available 24 hours/day Type of Home: House Home Access: Stairs to enter Secretary/administrator of Steps: 3 Entrance Stairs-Rails: None Home Layout: One level Bathroom Shower/Tub: Associate Professor: Yes Home Equipment: Environmental education officer Comments: unsure of accuracy of home situation as patient states that sometimes she lives with her mom but yet her mom can stay with her all the time if needed  Lives With: Family   Functional History: Prior Function Prior Level of Function : Independent/Modified Independent Mobility Comments: no AD, doesn't drive ADLs Comments: indep, uses door dash for groceries  Functional Status:  Mobility: Bed Mobility Overal bed mobility: Needs Assistance Bed Mobility: Supine to Sit, Sit to Supine Supine to sit: Contact guard Sit to supine: Contact guard assist General bed mobility comments: increased time Transfers Overall transfer level: Needs assistance Equipment used: None Transfers: Sit to/from Stand Sit to Stand: Contact guard assist General transfer comment: From EOB  with and without UE support with CGA for safety Ambulation/Gait Ambulation/Gait assistance: Contact guard assist Gait Distance (Feet): 125 Feet Assistive device: None, Quad cane Gait Pattern/deviations: Step-through pattern, Drifts right/left, Decreased stride length, Decreased stance time - left General Gait Details: Pt demonstrates short, guarded steps with LLE weakness noted, but no buckling or LOB. Pt completes first half of trial without AD and second half with quad cane. Pt demonstrates increased difficulty with sequencing with quad cane and significant decreased pace, but improves with progressed distance. Gait velocity: dec Gait velocity interpretation: <1.8 ft/sec, indicate of risk for recurrent falls Stairs: Yes Stairs assistance: Min assist Stair Management: One rail Right, No rails Number of Stairs: 4 General stair comments: Pt requires cues for sequencing. Pt initially uses 1 rail and then HHA to simulate home environment    ADL: ADL Overall ADL's : Needs assistance/impaired Eating/Feeding: Set up Grooming: Wash/dry hands, Wash/dry face, Contact guard assist, Standing Upper Body Bathing: Minimal assistance, Sitting, Standing Lower Body Bathing: Minimal assistance, Sitting/lateral leans Lower Body Bathing Details (indicate cue type and reason): able to bathe feet seated on EOB with min assist Upper Body Dressing : Minimal assistance, Sitting, Standing Upper Body Dressing Details (indicate cue type and reason): gown for back Lower Body Dressing:  Minimal assistance, Sitting/lateral leans Lower Body Dressing Details (indicate cue type and reason): able to do figure 4 and changed socks with min assist Toilet Transfer: Minimal assistance, Regular Toilet, Grab bars Toilet Transfer Details (indicate cue type and reason): cues for safety Toileting- Clothing Manipulation and Hygiene: Minimal assistance, Sit to/from stand Toileting - Clothing Manipulation Details (indicate cue type  and reason): min assist for clothing management Functional mobility during ADLs: Minimal assistance  Cognition: Cognition Overall Cognitive Status: Impaired/Different from baseline Arousal/Alertness: Awake/alert Orientation Level: Oriented X4 Year: 2025 Month: August Day of Week: Correct Attention: Focused, Sustained Focused Attention: Appears intact Sustained Attention: Impaired Sustained Attention Impairment: Verbal complex Memory: Impaired Memory Impairment: Retrieval deficit, Decreased recall of new information (Immediate: 5/5; delayed: 0/5; with cues: 2/5) Awareness: Impaired Awareness Impairment: Intellectual impairment Problem Solving: Impaired Problem Solving Impairment: Verbal complex (0/2) Executive Function: Sequencing, Adult nurse, Reasoning Reasoning: Impaired Reasoning Impairment: Verbal complex Sequencing: Impaired Sequencing Impairment: Verbal complex Organizing: Impaired Organizing Impairment: Verbal complex Cognition Arousal: Alert Behavior During Therapy: WFL for tasks assessed/performed Overall Cognitive Status: Impaired/Different from baseline   Blood pressure 121/80, pulse 95, temperature 98.2 F (36.8 C), temperature source Oral, resp. rate 16, height 5' 2 (1.575 m), weight 62.1 kg, SpO2 100%. Physical Exam Vitals and nursing note reviewed.  Constitutional:      Appearance: Normal appearance. She is normal weight.     Comments: Pt sitting up slightly in bed- vague, but answering questions; awake, alert, NAD  HENT:     Head: Normocephalic and atraumatic.     Comments: Missing 1 tooth in front Oropharynx moist No facial asymmetry Tongue midline Facial sensation decreased on R face, not L face     Right Ear: External ear normal.     Left Ear: External ear normal.     Nose: Nose normal. No congestion.     Mouth/Throat:     Mouth: Mucous membranes are moist.     Pharynx: Oropharynx is clear. No oropharyngeal exudate.  Eyes:     General:         Right eye: No discharge.        Left eye: No discharge.     Extraocular Movements: Extraocular movements intact.     Comments: Bilateral allergic shiners.   Cardiovascular:     Rate and Rhythm: Normal rate and regular rhythm.     Heart sounds: Normal heart sounds. No murmur heard.    No gallop.  Pulmonary:     Effort: Pulmonary effort is normal. No respiratory distress.     Breath sounds: Normal breath sounds. No wheezing, rhonchi or rales.  Abdominal:     Comments: Soft, but distended somewhat-  hypoactive; NT  Musculoskeletal:     Cervical back: Neck supple.     Comments: LUE 4+/5 throughout RUE 5/5 LLE- 4/5 in HF, KE, KF DF and PF RLE 5/5  Skin:    General: Skin is warm and dry.     Comments: B/L IV's in forearms- large bruise in L upper arm- likely from blood draw?   Neurological:     Mental Status: She is alert and oriented to person, place, and time.     Comments: Speech slow. Flat affect and able to follow simple motor commands. Had difficulty recalling medical history but oriented to date, age, DOB.  Decreased to light touch and pinprick in LUE/LLE Knows at [facility] month, date and year and naming, repetition intact However memory decreased got 1/3 with cues at 5 minutes Thought  next holiday was Halloween   Psychiatric:     Comments: Extremely flat- which pt said she's noticed     Results for orders placed or performed during the hospital encounter of 03/28/24 (from the past 48 hours)  Glucose, capillary     Status: Abnormal   Collection Time: 03/29/24  3:58 PM  Result Value Ref Range   Glucose-Capillary 224 (H) 70 - 99 mg/dL    Comment: Glucose reference range applies only to samples taken after fasting for at least 8 hours.  Heparin  level (unfractionated)     Status: Abnormal   Collection Time: 03/29/24  7:05 PM  Result Value Ref Range   Heparin  Unfractionated 0.14 (L) 0.30 - 0.70 IU/mL    Comment: (NOTE) The clinical reportable range upper limit is being  lowered to >1.10 to align with the FDA approved guidance for the current laboratory assay.  If heparin  results are below expected values, and patient dosage has  been confirmed, suggest follow up testing of antithrombin III  levels. Performed at [facility], [address], [place], [place] [address]   Glucose, capillary     Status: Abnormal   Collection Time: 03/29/24  7:42 PM  Result Value Ref Range   Glucose-Capillary 264 (H) 70 - 99 mg/dL    Comment: Glucose reference range applies only to samples taken after fasting for at least 8 hours.  Glucose, capillary     Status: Abnormal   Collection Time: 03/30/24 12:05 AM  Result Value Ref Range   Glucose-Capillary 222 (H) 70 - 99 mg/dL    Comment: Glucose reference range applies only to samples taken after fasting for at least 8 hours.   Comment 1 Document in Chart   Glucose, capillary     Status: Abnormal   Collection Time: 03/30/24  4:13 AM  Result Value Ref Range   Glucose-Capillary 184 (H) 70 - 99 mg/dL    Comment: Glucose reference range applies only to samples taken after fasting for at least 8 hours.   Comment 1 Document in Chart   Comprehensive metabolic panel with GFR     Status: Abnormal   Collection Time: 03/30/24  4:53 AM  Result Value Ref Range   Sodium 137 135 - 145 mmol/L   Potassium 4.2 3.5 - 5.1 mmol/L   Chloride 108 98 - 111 mmol/L   CO2 21 (L) 22 - 32 mmol/L   Glucose, Bld 212 (H) 70 - 99 mg/dL    Comment: Glucose reference range applies only to samples taken after fasting for at least 8 hours.   BUN <5 (L) 6 - 20 mg/dL   Creatinine, Ser 9.34 0.44 - 1.00 mg/dL   Calcium  8.8 (L) 8.9 - 10.3 mg/dL   Total Protein 6.2 (L) 6.5 - 8.1 g/dL   Albumin 2.7 (L) 3.5 - 5.0 g/dL   AST 17 15 - 41 U/L   ALT 12 0 - 44 U/L   Alkaline Phosphatase 67 38 - 126 U/L   Total Bilirubin 0.6 0.0 - 1.2 mg/dL   GFR, Estimated >39 >39 mL/min    Comment: (NOTE) Calculated using the CKD-EPI Creatinine Equation (2021)    Anion  gap 8 5 - 15    Comment: Performed at [facility], [address] [place], [place] [address]  Magnesium      Status: None   Collection Time: 03/30/24  4:53 AM  Result Value Ref Range   Magnesium  2.0 1.7 - 2.4 mg/dL    Comment:  Performed at [facility], [address], [place], [place] [address]  Phosphorus     Status: None   Collection Time: 03/30/24  4:53 AM  Result Value Ref Range   Phosphorus 4.3 2.5 - 4.6 mg/dL    Comment: Performed at [facility], [address] [place], [place] [address]  Heparin  level (unfractionated)     Status: None   Collection Time: 03/30/24  4:53 AM  Result Value Ref Range   Heparin  Unfractionated 0.33 0.30 - 0.70 IU/mL    Comment: (NOTE) The clinical reportable range upper limit is being lowered to >1.10 to align with the FDA approved guidance for the current laboratory assay.  If heparin  results are below expected values, and patient dosage has  been confirmed, suggest follow up testing of antithrombin III  levels. Performed at [facility], [address], [place], [place] [address]   CBC     Status: None   Collection Time: 03/30/24  4:53 AM  Result Value Ref Range   WBC 9.4 4.0 - 10.5 K/uL   RBC 4.43 3.87 - 5.11 MIL/uL   Hemoglobin 12.9 12.0 - 15.0 g/dL   HCT 61.4 63.9 - 53.9 %   MCV 86.9 80.0 - 100.0 fL   MCH 29.1 26.0 - 34.0 pg   MCHC 33.5 30.0 - 36.0 g/dL   RDW 86.8 88.4 - 84.4 %   Platelets 324 150 - 400 K/uL   nRBC 0.0 0.0 - 0.2 %    Comment: Performed at [facility], [address], [place], [place] [address]  C-peptide     Status: None   Collection Time: 03/30/24  4:53 AM  Result Value Ref Range   C-Peptide 1.6 1.1 - 4.4 ng/mL    Comment: (NOTE) C-Peptide reference interval is for fasting patients. Performed At: [facility] [address] [place], [place] [id number] Jennette Shorter MD Ey:[id number]   Glucose, capillary     Status: Abnormal   Collection Time: 03/30/24  9:47 AM  Result  Value Ref Range   Glucose-Capillary 216 (H) 70 - 99 mg/dL    Comment: Glucose reference range applies only to samples taken after fasting for at least 8 hours.  Glucose, capillary     Status: Abnormal   Collection Time: 03/30/24 10:59 AM  Result Value Ref Range   Glucose-Capillary 201 (H) 70 - 99 mg/dL    Comment: Glucose reference range applies only to samples taken after fasting for at least 8 hours.  Heparin  level (unfractionated)     Status: Abnormal   Collection Time: 03/30/24  2:18 PM  Result Value Ref Range   Heparin  Unfractionated 0.25 (L) 0.30 - 0.70 IU/mL    Comment: (NOTE) The clinical reportable range upper limit is being lowered to >1.10 to align with the FDA approved guidance for the current laboratory assay.  If heparin  results are below expected values, and patient dosage has  been confirmed, suggest follow up testing of antithrombin III  levels. Performed at [facility], [address], [place], [place] [address]   Glucose, capillary     Status: Abnormal   Collection Time: 03/30/24  4:05 PM  Result Value Ref Range   Glucose-Capillary 225 (H) 70 - 99 mg/dL    Comment: Glucose reference range applies only to samples taken after fasting for at least 8 hours.  Glucose, capillary     Status: Abnormal   Collection Time: 03/30/24  9:04 PM  Result Value Ref  Range   Glucose-Capillary 172 (H) 70 - 99 mg/dL    Comment: Glucose reference range applies only to samples taken after fasting for at least 8 hours.   Comment 1 Notify RN    Comment 2 Document in Chart   Glucose, capillary     Status: Abnormal   Collection Time: 03/31/24 12:05 AM  Result Value Ref Range   Glucose-Capillary 141 (H) 70 - 99 mg/dL    Comment: Glucose reference range applies only to samples taken after fasting for at least 8 hours.   Comment 1 Notify RN    Comment 2 Document in Chart   Renal function panel     Status: Abnormal   Collection Time: 03/31/24  3:37 AM  Result Value Ref Range    Sodium 138 135 - 145 mmol/L   Potassium 4.5 3.5 - 5.1 mmol/L   Chloride 106 98 - 111 mmol/L   CO2 24 22 - 32 mmol/L   Glucose, Bld 147 (H) 70 - 99 mg/dL    Comment: Glucose reference range applies only to samples taken after fasting for at least 8 hours.   BUN 5 (L) 6 - 20 mg/dL   Creatinine, Ser 9.32 0.44 - 1.00 mg/dL   Calcium  8.8 (L) 8.9 - 10.3 mg/dL   Phosphorus 5.1 (H) 2.5 - 4.6 mg/dL   Albumin 2.7 (L) 3.5 - 5.0 g/dL   GFR, Estimated >39 >39 mL/min    Comment: (NOTE) Calculated using the CKD-EPI Creatinine Equation (2021)    Anion gap 8 5 - 15    Comment: Performed at [facility], [address], [place], [place] [address]  Magnesium      Status: None   Collection Time: 03/31/24  3:37 AM  Result Value Ref Range   Magnesium  1.9 1.7 - 2.4 mg/dL    Comment: Performed at [facility], [address] [place], [place] [address]  Glucose, capillary     Status: Abnormal   Collection Time: 03/31/24  3:54 AM  Result Value Ref Range   Glucose-Capillary 157 (H) 70 - 99 mg/dL    Comment: Glucose reference range applies only to samples taken after fasting for at least 8 hours.   Comment 1 Notify RN    Comment 2 Document in Chart   Glucose, capillary     Status: Abnormal   Collection Time: 03/31/24  7:18 AM  Result Value Ref Range   Glucose-Capillary 129 (H) 70 - 99 mg/dL    Comment: Glucose reference range applies only to samples taken after fasting for at least 8 hours.  Glucose, capillary     Status: Abnormal   Collection Time: 03/31/24 11:37 AM  Result Value Ref Range   Glucose-Capillary 243 (H) 70 - 99 mg/dL    Comment: Glucose reference range applies only to samples taken after fasting for at least 8 hours.   VAS US  LOWER EXTREMITY VENOUS (DVT) Result Date: 03/30/2024  Lower Venous DVT Study Patient Name:  CELESTIAL BARNFIELD  Date of Exam:   03/30/2024 Medical Rec #: [id number]            Accession #:    [id number] Date of Birth: December 02, 1975           Patient Gender: F  Patient Age:   5 years Exam Location:  [facility] Procedure:      VAS US  LOWER EXTREMITY VENOUS (DVT) Referring Phys: MIGNON GONFA --------------------------------------------------------------------------------  Indications: Stroke, and Seizures.  Risk Factors: History of  DVT in 2012 and left chronic SFA occlusion. Anticoagulation: Heparin . Comparison Study: No priors. Performing Technologist: Ricka Sturdivant-Jones RDMS, RVT  Examination Guidelines: A complete evaluation includes B-mode imaging, spectral Doppler, color Doppler, and power Doppler as needed of all accessible portions of each vessel. Bilateral testing is considered an integral part of a complete examination. Limited examinations for reoccurring indications may be performed as noted. The reflux portion of the exam is performed with the patient in reverse Trendelenburg.  +---------+---------------+---------+-----------+----------+--------------+ RIGHT    CompressibilityPhasicitySpontaneityPropertiesThrombus Aging +---------+---------------+---------+-----------+----------+--------------+ CFV      Full           Yes      Yes                                 +---------+---------------+---------+-----------+----------+--------------+ SFJ      Full                                                        +---------+---------------+---------+-----------+----------+--------------+ FV Prox  Full                                                        +---------+---------------+---------+-----------+----------+--------------+ FV Mid   Full                                                        +---------+---------------+---------+-----------+----------+--------------+ FV DistalFull                                                        +---------+---------------+---------+-----------+----------+--------------+ PFV      Full                                                         +---------+---------------+---------+-----------+----------+--------------+ POP      Full           Yes      Yes                                 +---------+---------------+---------+-----------+----------+--------------+ PTV      Full                                                        +---------+---------------+---------+-----------+----------+--------------+ PERO     Full                                                        +---------+---------------+---------+-----------+----------+--------------+   +---------+---------------+---------+-----------+----------+--------------+  LEFT     CompressibilityPhasicitySpontaneityPropertiesThrombus Aging +---------+---------------+---------+-----------+----------+--------------+ CFV      Full           Yes      Yes                                 +---------+---------------+---------+-----------+----------+--------------+ SFJ      Full                                                        +---------+---------------+---------+-----------+----------+--------------+ FV Prox  Full                                                        +---------+---------------+---------+-----------+----------+--------------+ FV Mid   Full                                                        +---------+---------------+---------+-----------+----------+--------------+ FV DistalFull                                                        +---------+---------------+---------+-----------+----------+--------------+ PFV      Full                                                        +---------+---------------+---------+-----------+----------+--------------+ POP      Full           Yes      Yes                                 +---------+---------------+---------+-----------+----------+--------------+ PTV      Full                                                         +---------+---------------+---------+-----------+----------+--------------+ PERO     Full                                                        +---------+---------------+---------+-----------+----------+--------------+     Summary: BILATERAL: - No evidence of deep vein thrombosis seen in the lower extremities, bilaterally. -No evidence of popliteal cyst, bilaterally.   *See table(s) above for measurements and observations. Electronically signed by Gaile New MD on 03/30/2024 at 6:10:09 PM.  Final    ECHO TEE Result Date: 03/30/2024    TRANSESOPHOGEAL ECHO REPORT   Patient Name:   TIMICA MARCOM Date of Exam: 03/30/2024 Medical Rec #:  [id number]           Height:       62.0 in Accession #:    [id number]          Weight:       137.0 lb Date of Birth:  [birth year]          BSA:          1.628 m Patient Age:    47 years            BP:           118/98 mmHg Patient Gender: F                   HR:           104 bpm. Exam Location:  Inpatient Procedure: Transesophageal Echo, Cardiac Doppler, Color Doppler and 3D Echo            (Both Spectral and Color Flow Doppler were utilized during            procedure). Indications:     Stroke  History:         Patient has prior history of Echocardiogram examinations, most                  recent 03/28/2024. Stroke and Migraine.  Sonographer:     Thea Norlander RCS Referring Phys:  [id number] THOM CROME HALEY Diagnosing Phys: Darryle Decent MD PROCEDURE: After discussion of the risks and benefits of a TEE, an informed consent was obtained from the patient. TEE procedure time was 12 minutes. The transesophogeal probe was passed without difficulty through the esophogus of the patient. Imaged were obtained with the patient in a left lateral decubitus position. Sedation performed by different physician. The patient was monitored while under deep sedation. Anesthestetic sedation was provided intravenously by Anesthesiology: 491.23mg  of Propofol , 100mg  of Lidocaine . Image quality  was excellent. The patient's vital signs; including heart rate, blood pressure, and oxygen saturation; remained stable throughout the procedure. The patient developed no complications during the procedure.  IMPRESSIONS  1. Bubble study shows crossing >6 cardiac cycles which is more consistent with intrapulmonary shunt.  2. Left ventricular ejection fraction, by estimation, is 65 to 70%. The left ventricle has normal function.  3. Right ventricular systolic function is normal. The right ventricular size is normal.  4. No left atrial/left atrial appendage thrombus was detected. The LAA emptying velocity was 67 cm/s.  5. The mitral valve is grossly normal. No evidence of mitral valve regurgitation. No evidence of mitral stenosis.  6. The aortic valve is tricuspid. Aortic valve regurgitation is not visualized. No aortic stenosis is present.  7. Agitated saline contrast bubble study was positive with shunting observed after >6 cardiac cycles suggestive of intrapulmonary shunting.  8. 3D performed of the mitral valve and demonstrates 3D mitral valve was normal. Conclusion(s)/Recommendation(s): No LA/LAA thrombus identified. Negative bubble study for interatrial shunt. No intracardiac source of embolism detected on this on this transesophageal echocardiogram. FINDINGS  Left Ventricle: Left ventricular ejection fraction, by estimation, is 65 to 70%. The left ventricle has normal function. The left ventricular internal cavity size was normal in size. Right Ventricle: The right ventricular size is normal. No increase in right ventricular wall thickness. Right ventricular systolic function is normal. Left  Atrium: Left atrial size was normal in size. No left atrial/left atrial appendage thrombus was detected. The LAA emptying velocity was 67 cm/s. Right Atrium: Right atrial size was normal in size. Pericardium: There is no evidence of pericardial effusion. Mitral Valve: The mitral valve is grossly normal. No evidence of mitral  valve regurgitation. No evidence of mitral valve stenosis. Tricuspid Valve: The tricuspid valve is grossly normal. Tricuspid valve regurgitation is trivial. No evidence of tricuspid stenosis. Aortic Valve: The aortic valve is tricuspid. Aortic valve regurgitation is not visualized. No aortic stenosis is present. Pulmonic Valve: The pulmonic valve was normal in structure. Pulmonic valve regurgitation is not visualized. Aorta: The aortic root and ascending aorta are structurally normal, with no evidence of dilitation. Venous: The left upper pulmonary vein, right lower pulmonary vein, right upper pulmonary vein and left lower pulmonary vein are normal. IAS/Shunts: No atrial level shunt detected by color flow Doppler. Agitated saline contrast was given intravenously to evaluate for intracardiac shunting. Agitated saline contrast bubble study was positive with shunting observed after >6 cardiac cycles suggestive of intrapulmonary shunting. Additional Comments: 3D was performed not requiring image post processing on an independent workstation and was normal. Spectral Doppler performed. LEFT VENTRICLE PLAX 2D LVOT diam:     2.02 cm LVOT Area:     3.20 cm   AORTA Ao Root diam: 2.90 cm Ao Asc diam:  2.72 cm  SHUNTS Systemic Diam: 2.02 cm Darryle Decent MD Electronically signed by Darryle Decent MD Signature Date/Time: 03/30/2024/10:26:55 AM    Final    EP STUDY Result Date: 03/30/2024 See surgical note for result.     Blood pressure 121/80, pulse 95, temperature 98.2 F (36.8 C), temperature source Oral, resp. rate 16, height 5' 2 (1.575 m), weight 62.1 kg, SpO2 100%.  Medical Problem List and Plan: 1. Functional deficits secondary to Subacute R MCA stroke  -patient may  shower  -ELOS/Goals: 10-12 days Supervision  Admit to CIR 2.  H/o DVT/Antithrombotics: -DVT/anticoagulation:  Pharmaceutical: Eliquis   -antiplatelet therapy: N/A 3. Pain Management: tylenol  prn. Topamax  added for constant HA- wanted to add  Elavil since less memory issues, however pt has allergy that is triggered with Pamelor or Elavil-  4. Mood/Behavior/Sleep: LCSW to follow for evaluation and support.   -antipsychotic agents:  N/A 5. Neuropsych/cognition: This patient is capable of making decisions on her own behalf. 6. Skin/Wound Care: Routine pressure relief measures.  7. Fluids/Electrolytes/Nutrition: Monitor I/O.  8. T2DM: New diagnosis w/A1C-13.7. Now on insulin   --monitor BS ac/hs and use SSI for elevated BS 9. New onset seizure: On Keppra  bid 10. Elevated lipids: Chol-233, LDL 132, Trig-425. Now on Lipitor  80 mg daily.  11. H/o Mood disorder Schizaffective d/o and Bipolar written in chart- not clear since hasn't been on meds-/BPD/Depression: Not on any medications since her 20's.  --Adjustment issues now--wants something mild maybe as needed. Buspar  added 12. GERD/Bloating: Probiotic added. 13. H/o Tobacco/Marijuana use: Educate on importance of cessation       Sharlet GORMAN Schmitz, PA-C 03/31/2024   I have personally performed a face to face diagnostic evaluation of this patient and formulated the key components of the plan.  Additionally, I have personally reviewed laboratory data, imaging studies, as well as relevant notes and concur with the physician assistant's documentation above.   The patient's status has not changed from the original H&P.  Any changes in documentation from the acute care chart have been noted above.

## 2024-03-31 NOTE — Progress Notes (Signed)
 Inpatient Rehab Admissions Coordinator:    I will admit Pt. To CIR today. Rn may call report to [phone].   Pt. To admit to CIR for estimated 7-10 days with goal of reaching supervision level and returning home with her mother and other family members.   Leita Kleine, MS, CCC-SLP Rehab Admissions Coordinator  [phone] (celll) [phone] (office)

## 2024-04-01 DIAGNOSIS — I739 Peripheral vascular disease, unspecified: Secondary | ICD-10-CM

## 2024-04-01 LAB — COMPREHENSIVE METABOLIC PANEL WITH GFR
ALT: 12 U/L (ref 0–44)
AST: 21 U/L (ref 15–41)
Albumin: 2.8 g/dL — ABNORMAL LOW (ref 3.5–5.0)
Alkaline Phosphatase: 68 U/L (ref 38–126)
Anion gap: 13 (ref 5–15)
BUN: 7 mg/dL (ref 6–20)
CO2: 20 mmol/L — ABNORMAL LOW (ref 22–32)
Calcium: 9.1 mg/dL (ref 8.9–10.3)
Chloride: 107 mmol/L (ref 98–111)
Creatinine, Ser: 0.64 mg/dL (ref 0.44–1.00)
GFR, Estimated: >60 mL/min (ref >60)
Glucose, Bld: 214 mg/dL — ABNORMAL HIGH (ref 70–99)
Potassium: 4.7 mmol/L (ref 3.5–5.1)
Sodium: 140 mmol/L (ref 135–145)
Total Bilirubin: 0.9 mg/dL (ref 0.0–1.2)
Total Protein: 6.2 g/dL — ABNORMAL LOW (ref 6.5–8.1)

## 2024-04-01 LAB — CBC WITH DIFFERENTIAL/PLATELET
Abs Immature Granulocytes: 0.05 K/uL (ref 0.00–0.07)
Basophils Absolute: 0 K/uL (ref 0.0–0.1)
Basophils Relative: 0 %
Eosinophils Absolute: 0.2 K/uL (ref 0.0–0.5)
Eosinophils Relative: 3 %
HCT: 40.6 % (ref 36.0–46.0)
Hemoglobin: 13.2 g/dL (ref 12.0–15.0)
Immature Granulocytes: 1 %
Lymphocytes Relative: 32 %
Lymphs Abs: 2.4 K/uL (ref 0.7–4.0)
MCH: 28.9 pg (ref 26.0–34.0)
MCHC: 32.5 g/dL (ref 30.0–36.0)
MCV: 88.8 fL (ref 80.0–100.0)
Monocytes Absolute: 0.7 K/uL (ref 0.1–1.0)
Monocytes Relative: 9 %
Neutro Abs: 4.2 K/uL (ref 1.7–7.7)
Neutrophils Relative %: 55 %
Platelets: 320 K/uL (ref 150–400)
RBC: 4.57 MIL/uL (ref 3.87–5.11)
RDW: 13 % (ref 11.5–15.5)
WBC: 7.4 K/uL (ref 4.0–10.5)
nRBC: 0 % (ref 0.0–0.2)

## 2024-04-01 LAB — GLUCOSE, CAPILLARY
Glucose-Capillary: 210 mg/dL — ABNORMAL HIGH (ref 70–99)
Glucose-Capillary: 246 mg/dL — ABNORMAL HIGH (ref 70–99)
Glucose-Capillary: 163 mg/dL — ABNORMAL HIGH (ref 70–99)
Glucose-Capillary: 212 mg/dL — ABNORMAL HIGH (ref 70–99)

## 2024-04-01 MED ORDER — INSULIN GLARGINE-YFGN 100 UNIT/ML ~~LOC~~ SOLN
15.0000 [IU] | Freq: Two times a day (BID) | SUBCUTANEOUS | Status: DC
  Administered 2024-04-01 – 2024-04-06 (×16): 15 [IU] via SUBCUTANEOUS
  Filled 2024-04-01 (×13): qty 0.15

## 2024-04-01 MED ORDER — DOXYCYCLINE HYCLATE 100 MG PO TABS
100.0000 mg | ORAL_TABLET | Freq: Two times a day (BID) | ORAL | Status: DC
  Administered 2024-04-01 – 2024-04-07 (×19): 100 mg via ORAL
  Filled 2024-04-01 (×13): qty 1

## 2024-04-01 NOTE — Evaluation (Signed)
 Speech Language Pathology Assessment and Plan  Patient Details  Name: Terri [name] MRN: [id number] Date of Birth: [birth year]  SLP Diagnosis: Cognitive Impairments  Rehab Potential: Good ELOS: 10-12 days   Today's Date: 04/01/2024 SLP Individual Time: 1100-1153 SLP Individual Time Calculation (min): 53 min  Hospital Problem: Principal Problem:   Acute ischemic right MCA stroke [facility])  Past Medical History:  Past Medical History:  Diagnosis Date   Allergies    nasal congestion/eyes get itchy   DEPRESSION 10/18/2009   DVT (deep vein thrombosis) in pregnancy 2012   In setting of tobacco use/OCP   ENDOMETRIOSIS 10/18/2009   GERD 10/18/2009   History of HPV infection    MIGRAINE HEADACHE 10/18/2009   UNSPECIFIED PERIPHERAL VASCULAR DISEASE 10/18/2009   embolization to right 2nd and 3rd finger   Past Surgical History:  Past Surgical History:  Procedure Laterality Date   APPENDECTOMY     BOWEL RESECTION     r/t endometriosis   OVARIAN CYST REMOVAL     bilateral   TRANSESOPHAGEAL ECHOCARDIOGRAM (CATH LAB) N/A 03/30/2024   Procedure: TRANSESOPHAGEAL ECHOCARDIOGRAM;  Surgeon: Barbaraann Darryle Ned, MD;  Location: [facility] INVASIVE CV LAB;  Service: Cardiovascular;  Laterality: N/A;    Assessment / Plan / Recommendation Clinical Impression  Pt. Is a 48 year old female with PMH as significant for DVT, Bipolar, Schizoaffective disorder, and migraines who  presented to [name] ED 8/4/25with complaints of weakness and AMS. Onset of symptoms was approximately 8/3 around 2100. There were also witnessed episodes of seizure like activity (body tightening and arms jumping). Upon arrival to the ED she was found to have flaccid paralysis of the left upper and lower extremities. She was treated as a Code Stroke and was taken immediately to the CT scanner, which showed no LVO, but was positive for acute thrombus in both common carotid arteries as well as enhancement in the right cerebral  hemisphere concerning for subacute infarcts. Petechial hemorrhage also seen. She was evaluated by neurology and had a witnessed seizure which was successfully abated with IV ativan . Heparin  infusion started by neurology after weighing risks considering petechial hemorrhage. CT head right frontal chronic and subacute infarcts with mild hemorrhagic transformation CT head and neck showed bilateral CCA thrombus, right more than left.  Multifocal abnormal enhancement in the right cerebral hemisphere, suggestive of enhancing subacute infarcts with epidural hemorrhage. MRI with and without contrast showed Multiple foci of T2 hyperintensity and cortical enhancement in the right MCA territory with associated petechial hemorrhage and some restricted diffusion, consistent with subacute infarcts.  2D Echo EF 70 to 75%. Pt. Seen by PT/OT/SLP and they recommend CIR to assist return to PLOF.   Cognitive-Linguistic Function: Patient presents with changes to cognitive status compared to baseline as indicated by mild-moderately impaired performance on attention and executive functioning tasks completed during evaluation session compared to same-aged peers. Patient completed all subtests of RBANS form C and exhibited difficulty during digit span and coding as well as line identification and figure copy sections. Patient scored with normal range for language tasks and only tracely below average on immediate and delayed memory tasks. Patient was oriented x4 independently and engaged in medication management tasks by accurately identifying errors in a BID pillbox with only min assist. Patient exhibits no deficits in the areas of receptive/expressive language nor motor speech. Patient would benefit from ongoing SLP services during remaining length of stay to target all aforementioned deficits. Patient in agreement with plan.    Skilled Therapeutic Interventions  SLP conducted skilled evaluation session to assess  cognitive-linguistic function. Utilized RBANS and patient and/or family interview. Full results above.   SLP Assessment  Patient will need skilled Speech Lanaguage Pathology Services during CIR admission    Recommendations  Medication Administration: Whole meds with puree Recommendations for Other Services: Neuropsych consult Patient destination: Home Follow up Recommendations: Outpatient SLP Equipment Recommended: None recommended by SLP    SLP Frequency 3 to 5 out of 7 days   SLP Duration  SLP Intensity  SLP Treatment/Interventions 10-12 days  Minumum of 1-2 x/day, 30 to 90 minutes  Cognitive remediation/compensation;Cueing hierarchy;Functional tasks;Therapeutic Activities;Internal/external aids;Patient/family education    Pain Pain Assessment Pain Scale: 0-10 Pain Score: 3  Pain Location: Head Pain Intervention(s): Medication (See eMAR)  Prior Functioning Cognitive/Linguistic Baseline: Within functional limits Type of Home: House  Lives With: Family (Mother) Available Help at Discharge: Family;Available 24 hours/day Education: some college Vocation: Unemployed  SLP Evaluation Cognition Overall Cognitive Status: Impaired/Different from baseline Arousal/Alertness: Awake/alert Orientation Level: Oriented X4 Year: 2025 Month: August Day of Week: Correct Attention: Focused;Sustained Focused Attention: Appears intact Sustained Attention: Impaired Sustained Attention Impairment: Functional complex;Verbal complex Memory: Appears intact Awareness: Impaired Awareness Impairment: Anticipatory impairment;Intellectual impairment Problem Solving: Impaired Problem Solving Impairment: Verbal complex;Functional complex Executive Function: Sequencing;Organizing;Reasoning Reasoning: Impaired Reasoning Impairment: Verbal complex;Functional complex Sequencing: Impaired Sequencing Impairment: Verbal complex;Functional complex Organizing: Impaired Organizing Impairment:  Verbal complex;Functional complex Safety/Judgment: Impaired Comments: mild impulsivity  Comprehension Auditory Comprehension Overall Auditory Comprehension: Appears within functional limits for tasks assessed Expression Expression Primary Mode of Expression: Verbal Verbal Expression Overall Verbal Expression: Appears within functional limits for tasks assessed Written Expression Dominant Hand: Right Oral Motor Oral Motor/Sensory Function Overall Oral Motor/Sensory Function: Within functional limits Motor Speech Overall Motor Speech: Appears within functional limits for tasks assessed Respiration: Within functional limits Phonation: Low vocal intensity Resonance: Within functional limits Articulation: Within functional limitis Intelligibility: Intelligible Motor Planning: Within functional limits Motor Speech Errors: Not applicable  Care Tool Care Tool Cognition Ability to hear (with hearing aid or hearing appliances if normally used Ability to hear (with hearing aid or hearing appliances if normally used): 0. Adequate - no difficulty in normal conservation, social interaction, listening to TV   Expression of Ideas and Wants Expression of Ideas and Wants: 4. Without difficulty (complex and basic) - expresses complex messages without difficulty and with speech that is clear and easy to understand   Understanding Verbal and Non-Verbal Content Understanding Verbal and Non-Verbal Content: 4. Understands (complex and basic) - clear comprehension without cues or repetitions  Memory/Recall Ability Memory/Recall Ability : Current season;That he or she is in a hospital/hospital unit   Intelligibility: Intelligible  Short Term Goals: Week 1: SLP Short Term Goal 1 (Week 1): Patient will solve mildly complex problems during functional iADL-related tasks given supervision cues. SLP Short Term Goal 2 (Week 1): Patient will state cognitive deficits and how they impact performance on functional  iADL tasks given supervision cues. SLP Short Term Goal 3 (Week 1): Patient will sustain attention to functional therapy tasks for 20 minutes given min cues.  Refer to Care Plan for Long Term Goals  Recommendations for other services: Neuropsych  Discharge Criteria: Patient will be discharged from SLP if patient refuses treatment 3 consecutive times without medical reason, if treatment goals not met, if there is a change in medical status, if patient makes no progress towards goals or if patient is discharged from hospital.  The above assessment, treatment plan, treatment alternatives and goals  were discussed and mutually agreed upon: by patient  Terri [name], M.A., CCC-SLP  Tamsin Nader A Robert Sperl 04/01/2024, 12:41 PM

## 2024-04-01 NOTE — Progress Notes (Signed)
 Inpatient Rehabilitation Care Coordinator Assessment and Plan Patient Details  Name: Terri [name] MRN: [id number] Date of Birth: [birth year]  Today's Date: 04/01/2024  Hospital Problems: Principal Problem:   Acute ischemic right MCA stroke [facility])  Past Medical History:  Past Medical History:  Diagnosis Date   Allergies    nasal congestion/eyes get itchy   DEPRESSION 10/18/2009   DVT (deep vein thrombosis) in pregnancy 2012   In setting of tobacco use/OCP   ENDOMETRIOSIS 10/18/2009   GERD 10/18/2009   History of HPV infection    MIGRAINE HEADACHE 10/18/2009   UNSPECIFIED PERIPHERAL VASCULAR DISEASE 10/18/2009   embolization to right 2nd and 3rd finger   Past Surgical History:  Past Surgical History:  Procedure Laterality Date   APPENDECTOMY     BOWEL RESECTION     r/t endometriosis   OVARIAN CYST REMOVAL     bilateral   TRANSESOPHAGEAL ECHOCARDIOGRAM (CATH LAB) N/A 03/30/2024   Procedure: TRANSESOPHAGEAL ECHOCARDIOGRAM;  Surgeon: Barbaraann Darryle Ned, MD;  Location: [facility] INVASIVE CV LAB;  Service: Cardiovascular;  Laterality: N/A;   Social History:  reports that she has been smoking cigarettes. She has a 7.5 pack-year smoking history. She has never used smokeless tobacco. She reports current alcohol use. She reports current drug use. Drug: Marijuana.  Family / Support Systems Marital Status: Single Patient Roles: Other (Comment) (daughter) Other Supports: Elveria Plaster [phone] Anticipated Caregiver: Mom Ability/Limitations of Caregiver: Mom is retired but according to pt has a social life Caregiver Availability: Other (Comment) (almost 24/7) Family Dynamics: Close with mom who is supportive and whom she lives with. Mom can assist if needed but is not always there and is with friends.  Social History Preferred language: English Religion: Non-Denominational Cultural Background: NA Education: HS Health Literacy - How often do you need to have someone help you when  you read instructions, pamphlets, or other written material from your doctor or pharmacy?: Never Writes: Yes Employment Status: Unemployed Marine scientist Issues: NA Guardian/Conservator: None-according to MD pt is capable of makikng her own decisions while here   Abuse/Neglect Abuse/Neglect Assessment Can Be Completed: Yes Physical Abuse: Denies Verbal Abuse: Denies Sexual Abuse: Denies Exploitation of patient/patient's resources: Denies Self-Neglect: Denies  Patient response to: Social Isolation - How often do you feel lonely or isolated from those around you?: Never  Emotional Status Pt's affect, behavior and adjustment status: Pt is motivated to recover from this stroke and get back to her independent level. She is not one to ask for assist and does not want to burden her Mom with her care. They do live together and get along well. Recent Psychosocial Issues: other health issues Psychiatric History: Hx-depression/bipolar/schizoaffective was not getting treatment for these and is interested in getting some follow up for them. Have placed on neuro-psych list to be seen while here Substance Abuse History: Tobacco/THC she is aware of the health risks and it would be better if she could quit both of these  Patient / Family Perceptions, Expectations & Goals Pt/Family understanding of illness & functional limitations: Pt is able to explain her stroke and deficits she is hopeful she will does well and be able to get mod/i level. She does talk with the MD's and feel understands her plan moving forward. Premorbid pt/family roles/activities: daughter, freind, etc Anticipated changes in roles/activities/participation: resume Pt/family expectations/goals: Pt states:  I want to be able to do for myself when I leave here.  Community [employer] Agencies: None Premorbid Home Care/DME Agencies: Other (Comment) (  tub seat) Transportation available at discharge: mom Is the  patient able to respond to transportation needs?: Yes In the past 12 months, has lack of transportation kept you from medical appointments or from getting medications?: No In the past 12 months, has lack of transportation kept you from meetings, work, or from getting things needed for daily living?: No Resource referrals recommended: Neuropsychology  Discharge Planning Living Arrangements: Parent Support Systems: Parent, Friends/neighbors Type of Residence: Private residence Insurance Resources: Futures trader (Applying for [employer]) Financial Resources: Family Support Financial Screen Referred: Yes Living Expenses: Lives with family Money Management: Family Does the patient have any problems obtaining your medications?: Yes (Describe) (No PCP and uninsured) Home Management: both Patient/Family Preliminary Plans: Return home with MOm who is retired and mostly there, according to pt she has a social life. She is aware she is being evaluated today and goals being set for stay here. Care Coordinator Barriers to Discharge: Insurance for SNF coverage, Lack of/limited family support Care Coordinator Anticipated Follow Up Needs: HH/OP  Clinical Impression Pleasant female who is motivated to recover and regain her independence. She lives with her Mom who is supportive and involved. Being evaluated today and goals being set. Will arrange PCP and follow up for her MH at discharge and work on discharge needs. She reports Medicaid application has been applied for  Raymonde Asberry MATSU 04/01/2024, 11:41 AM

## 2024-04-01 NOTE — Progress Notes (Signed)
 Inpatient Rehabilitation Center Individual Statement of Services  Patient Name:  Terri [name]  Date:  04/01/2024  Welcome to the Inpatient Rehabilitation Center.  Our goal is to provide you with an individualized program based on your diagnosis and situation, designed to meet your specific needs.  With this comprehensive rehabilitation program, you will be expected to participate in at least 3 hours of rehabilitation therapies Monday-Friday, with modified therapy programming on the weekends.  Your rehabilitation program will include the following services:  Physical Therapy (PT), Occupational Therapy (OT), Speech Therapy (ST), 24 hour per day rehabilitation nursing, Therapeutic Recreaction (TR), Neuropsychology, Care Coordinator, Rehabilitation Medicine, Nutrition Services, and Pharmacy Services  Weekly team conferences will be held on Wednesday to discuss your progress.  Your Inpatient Rehabilitation Care Coordinator will talk with you frequently to get your input and to update you on team discussions.  Team conferences with you and your family in attendance may also be held.  Expected length of stay: 5-7 days  Overall anticipated outcome: independent with device  Depending on your progress and recovery, your program may change. Your Inpatient Rehabilitation Care Coordinator will coordinate services and will keep you informed of any changes. Your Inpatient Rehabilitation Care Coordinator's name and contact numbers are listed  below.  The following services may also be recommended but are not provided by the Inpatient Rehabilitation Center:   Home Health Rehabiltiation Services Outpatient Rehabilitation Services    Arrangements will be made to provide these services after discharge if needed.  Arrangements include referral to agencies that provide these services.  Your insurance has been verified to be:  Uninsured Your primary doctor is:  None  Pertinent information will be shared  with your doctor and your insurance company.  Inpatient Rehabilitation Care Coordinator:  Terri [name], Terri [name] [phone] or Terri [name]  Information discussed with and copy given to patient by: Terri [name], 04/01/2024, 11:44 AM

## 2024-04-01 NOTE — Progress Notes (Signed)
 Inpatient Rehabilitation  Patient information reviewed and entered into eRehab system by [facility]. Karen Kays., CCC/SLP, PPS Coordinator.  Information including medical coding, functional ability and quality indicators will be reviewed and updated through discharge.

## 2024-04-01 NOTE — Plan of Care (Signed)
  Problem: RH Bathing Goal: LTG Patient will bathe all body parts with assist levels (OT) Description: LTG: Patient will bathe all body parts with assist levels (OT) Flowsheets (Taken 04/01/2024 1225) LTG: Pt will perform bathing with assistance level/cueing: Independent with assistive device    Problem: RH Dressing Goal: LTG Patient will perform lower body dressing w/assist (OT) Description: LTG: Patient will perform lower body dressing with assist, with/without cues in positioning using equipment (OT) Flowsheets (Taken 04/01/2024 1225) LTG: Pt will perform lower body dressing with assistance level of: Independent with assistive device   Problem: RH Toileting Goal: LTG Patient will perform toileting task (3/3 steps) with assistance level (OT) Description: LTG: Patient will perform toileting task (3/3 steps) with assistance level (OT)  Flowsheets (Taken 04/01/2024 1225) LTG: Pt will perform toileting task (3/3 steps) with assistance level: Independent with assistive device   Problem: RH Simple Meal Prep Goal: LTG Patient will perform simple meal prep w/assist (OT) Description: LTG: Patient will perform simple meal prep with assistance, with/without cues (OT). Flowsheets (Taken 04/01/2024 1225) LTG: Pt will perform simple meal prep with assistance level of: Supervision/Verbal cueing   Problem: RH Toilet Transfers Goal: LTG Patient will perform toilet transfers w/assist (OT) Description: LTG: Patient will perform toilet transfers with assist, with/without cues using equipment (OT) Flowsheets (Taken 04/01/2024 1225) LTG: Pt will perform toilet transfers with assistance level of: Independent with assistive device   Problem: RH Tub/Shower Transfers Goal: LTG Patient will perform tub/shower transfers w/assist (OT) Description: LTG: Patient will perform tub/shower transfers with assist, with/without cues using equipment (OT) Flowsheets (Taken 04/01/2024 1225) LTG: Pt will perform tub/shower stall  transfers with assistance level of: Independent with assistive device

## 2024-04-01 NOTE — IPOC Note (Addendum)
 Overall Plan of Care [facility]) Patient Details Name: Terri [name] MRN: [id number] DOB: 10-12-1975  Admitting Diagnosis: Acute ischemic right MCA stroke [facility])  Hospital Problems: Principal Problem:   Acute ischemic right MCA stroke (HCC)     Functional Problem List: Nursing Safety, Endurance, Medication Management, Pain  PT Balance, Endurance, Motor, Perception, Safety, Sensory  OT Balance, Endurance, Motor, Sensory, Safety, Cognition  SLP Cognition, Safety  TR         Basic ADL's: OT Bathing, Dressing, Toileting     Advanced  ADL's: OT Simple Meal Preparation     Transfers: PT Bed Mobility, Bed to Chair, Car, Furniture, [employer] fast streamer, Research scientist (life sciences): PT Ambulation, Stairs     Additional Impairments: OT None  SLP Social Cognition   Problem Solving, Attention, Awareness  TR      Anticipated Outcomes Item Anticipated Outcome  Self Feeding Independent  Swallowing      Basic self-care  Mod I  Toileting  Mod I   Bathroom Transfers Mod I  Bowel/Bladder  n/a  Transfers  Mod I  Locomotion  Mod I  Communication     Cognition  supervision to modI  Pain  Pain < 4 with prns  Safety/Judgment  manage safety w cues   Therapy Plan: PT Intensity: Minimum of 1-2 x/day ,45 to 90 minutes PT Frequency: 5 out of 7 days PT Duration Estimated Length of Stay: 7-10 days OT Intensity: Minimum of 1-2 x/day, 45 to 90 minutes OT Frequency: 5 out of 7 days OT Duration/Estimated Length of Stay: 5-7 days SLP Intensity: Minumum of 1-2 x/day, 30 to 90 minutes SLP Frequency: 3 to 5 out of 7 days SLP Duration/Estimated Length of Stay: 10-12 days   Team Interventions: Nursing Interventions Patient/Family Education, Medication Management, Disease Management/Prevention, Pain Management, Discharge Planning  PT interventions Ambulation/gait training, Balance/vestibular training, Cognitive remediation/compensation, Community reintegration, Discharge planning,  Disease management/prevention, DME/adaptive equipment instruction, Functional electrical stimulation, Functional mobility training, Neuromuscular re-education, Pain management, Patient/family education, Psychosocial support, Splinting/orthotics, Stair training, Therapeutic Activities, Therapeutic Exercise, UE/LE Strength taining/ROM, UE/LE Coordination activities, Visual/perceptual remediation/compensation  OT Interventions Balance/vestibular training, Discharge planning, Pain management, Self Care/advanced ADL retraining, Therapeutic Activities, UE/LE Coordination activities, Cognitive remediation/compensation, Disease mangement/prevention, Functional mobility training, Patient/family education, Skin care/wound managment, Therapeutic Exercise, DME/adaptive equipment instruction, Neuromuscular re-education, UE/LE Strength taining/ROM  SLP Interventions Cognitive remediation/compensation, Cueing hierarchy, Functional tasks, Therapeutic Activities, Internal/external aids, Patient/family education  TR Interventions    SW/CM Interventions Discharge Planning, Psychosocial Support, Patient/Family Education   Barriers to Discharge MD  Medical stability  Nursing Decreased caregiver support, Home environment access/layout 1 level 3 ste no rails solo; mom to assist  PT Decreased caregiver support, Insurance for SNF coverage, Medication compliance    OT Home environment access/layout    SLP      SW Insurance for SNF coverage, Lack of/limited family support     Team Discharge Planning: Destination: PT-Home ,OT- Home , SLP-Home Projected Follow-up: PT-Outpatient PT, 24 hour supervision/assistance, OT-  Outpatient OT, SLP-Outpatient SLP Projected Equipment Needs: PT-To be determined, OT- To be determined, SLP-None recommended by SLP Equipment Details: PT- , OT-  Patient/family involved in discharge planning: PT- Patient,  OT-Patient, SLP-Patient  MD ELOS: 7-10d Medical Rehab Prognosis:   Excellent Assessment: The patient has been admitted for CIR therapies with the diagnosis of Right MCA stroke . The team will be addressing functional mobility, strength, stamina, balance, safety, adaptive techniques and equipment, self-care, bowel and bladder mgt, patient and  caregiver education, Blood sugar management . Goals have been set at Mod I. Anticipated discharge destination is Home .        See Team Conference Notes for weekly updates to the plan of care

## 2024-04-01 NOTE — Plan of Care (Signed)
  Problem: RH Problem Solving Goal: LTG Patient will demonstrate problem solving for (SLP) Description: LTG:  Patient will demonstrate problem solving for basic/complex daily situations with cues  (SLP) Flowsheets (Taken 04/01/2024 1245) LTG: Patient will demonstrate problem solving for (SLP): (mildly complex) -- LTG Patient will demonstrate problem solving for: Supervision   Problem: RH Attention Goal: LTG Patient will demonstrate this level of attention during functional activites (SLP) Description: LTG:  Patient will will demonstrate this level of attention during functional activites (SLP) Flowsheets (Taken 04/01/2024 1245) Patient will demonstrate during cognitive/linguistic activities the attention type of: Sustained Patient will demonstrate this level of attention during cognitive/linguistic activities in: Controlled LTG: Patient will demonstrate this level of attention during cognitive/linguistic activities with assistance of (SLP): Supervision Number of minutes patient will demonstrate attention during cognitive/linguistic activities: 20   Problem: RH Awareness Goal: LTG: Patient will demonstrate awareness during functional activites type of (SLP) Description: LTG: Patient will demonstrate awareness during functional activites type of (SLP) Flowsheets (Taken 04/01/2024 1245) Patient will demonstrate during cognitive/linguistic activities awareness type of: Anticipatory LTG: Patient will demonstrate awareness during cognitive/linguistic activities with assistance of (SLP): Supervision

## 2024-04-01 NOTE — Evaluation (Signed)
 Physical Therapy Assessment and Plan  Patient Details  Name: Terri [name] MRN: [id number] Date of Birth: May 24, 1976  PT Diagnosis: Difficulty walking and Hemiparesis non-dominant Rehab Potential: Good ELOS: 7-10 days   Today's Date: 04/01/2024 PT Individual Time: 8697-8587 PT Individual Time Calculation (min): 70 min  Hospital Problem: Principal Problem:   Acute ischemic right MCA stroke [facility])   Past Medical History:  Past Medical History:  Diagnosis Date   Allergies    nasal congestion/eyes get itchy   DEPRESSION 10/18/2009   DVT (deep vein thrombosis) in pregnancy 2012   In setting of tobacco use/OCP   ENDOMETRIOSIS 10/18/2009   GERD 10/18/2009   History of HPV infection    MIGRAINE HEADACHE 10/18/2009   UNSPECIFIED PERIPHERAL VASCULAR DISEASE 10/18/2009   embolization to right 2nd and 3rd finger   Past Surgical History:  Past Surgical History:  Procedure Laterality Date   APPENDECTOMY     BOWEL RESECTION     r/t endometriosis   OVARIAN CYST REMOVAL     bilateral   TRANSESOPHAGEAL ECHOCARDIOGRAM (CATH LAB) N/A 03/30/2024   Procedure: TRANSESOPHAGEAL ECHOCARDIOGRAM;  Surgeon: Barbaraann Darryle Ned, MD;  Location: [facility] INVASIVE CV LAB;  Service: Cardiovascular;  Laterality: N/A;    Assessment & Plan Clinical Impression: Patient is a 48 y.o. R handed old female with  history of DVT, bipolar d/o, depression, migraines, dizziness and weakness for a week'\, who was admitted on 03/28/24 with witnessed seizure type episode, weakness and AMS. She was found to have flaccid left hemiplegia and CT head done showed acute thrombus in both CCA with abnormal enhancement in right hemisphere and petechial hemorrhage in right middle frontal gyrus area.  She ws loaded with Keppa 2 gram followed by 1 gram BID.  Neurology recommended ruling out cause of emboli and anticoagulation benefits outweighed risks as well as tight control of BS. She was started on IV heaprin.  CTA abdomen, pelvis  and BLE showed left superficial femoral artery to be completely occluded, moderate calcific atherosclerosis within aortoiliac arteries, ground glass opacities RLL and LLL and s/p partial colectomy with anastomosis sutures in right pelvis. CTA chest negative for PE and hazy opacities in RLL likely combination of edema and atelectasis. BLE dopplers were negative for DVT.  2 D echo was negative for shunt but  TEE showed intrapulmonary shunt with EF 65-70% and no thrombus seen.    MRI brain done on 08/05 revealing multiple foci of T2 hyperintensity and cortical enhancement in R-MCA territory (right middle frontal gyrus, central sulcus, right temporal and parietal lobes) with associated petechial hemorrhage, remote infarct and encephalomalacia in right parietal lobe. Autoimmune panel and Hypercoagulopathy panel  negative.  EEG negative for seizures. She has been transitioned to Eliquis  with recommendations to repeat CTA head/neck in 2-3 months for follow up and decision to transition to anti-platelets if thrombus resolved. She continues to be  limited by left sided weakness with sensory deficits and needs cues for higher level cognitive tasks.    PT/OT has been working with patient who requires min assist with ADLs and CGA with mobility. She was independent PTA and CIR recommended due to functional decline. Patient transferred to CIR on 03/31/2024 .   Patient currently requires CGA with mobility secondary to muscle weakness, decreased cardiorespiratoy endurance, unbalanced muscle activation and decreased coordination, decreased attention to left and decreased motor planning, decreased awareness and decreased safety awareness, and decreased standing balance, hemiplegia, and decreased balance strategies.  Prior to hospitalization, patient was independent  with  mobility and lived with Family (mother) in a House home.  Home access is 3Stairs to enter.  Patient will benefit from skilled PT intervention to maximize safe  functional mobility, minimize fall risk, and decrease caregiver burden for planned discharge home with 24 hour supervision.  Anticipate patient will benefit from follow up OP at discharge.  PT - End of Session Activity Tolerance: Tolerates 30+ min activity with multiple rests Endurance Deficit: Yes PT Assessment Rehab Potential (ACUTE/IP ONLY): Good PT Barriers to Discharge: Decreased caregiver support;Insurance for SNF coverage;Medication compliance PT Plan PT Intensity: Minimum of 1-2 x/day ,45 to 90 minutes PT Frequency: 5 out of 7 days PT Duration Estimated Length of Stay: 7-10 days PT Treatment/Interventions: Ambulation/gait training;Balance/vestibular training;Cognitive remediation/compensation;Community reintegration;Discharge planning;Disease management/prevention;DME/adaptive equipment instruction;Functional electrical stimulation;Functional mobility training;Neuromuscular re-education;Pain management;Patient/family education;Psychosocial support;Splinting/orthotics;Stair training;Therapeutic Activities;Therapeutic Exercise;UE/LE Strength taining/ROM;UE/LE Coordination activities;Visual/perceptual remediation/compensation PT Recommendation Recommendations for Other Services: Therapeutic Recreation consult Therapeutic Recreation Interventions: Warehouse manager;Outing/community reintergration Follow Up Recommendations: Outpatient PT;24 hour supervision/assistance Patient destination: Home Equipment Recommended: To be determined   PT Evaluation Precautions/Restrictions Precautions Precautions: Fall Precaution/Restrictions Comments: L hemiparesis Restrictions Weight Bearing Restrictions Per Provider Order: No General   Vital SignsTherapy Vitals Temp: 98.4 F (36.9 C) Temp Source: Oral Pulse Rate: 91 Resp: 16 BP: 117/80 Patient Position (if appropriate): Lying Oxygen Therapy SpO2: 100 % O2 Device: Room Air Pain Pain Assessment Pain Scale: 0-10 Pain Score:  3  Pain Type: Acute pain;Chronic pain (headache) Pain Location: Head Pain Descriptors / Indicators: Headache;Aching;Constant;Discomfort Pain Onset: On-going Patients Stated Pain Goal: 0 Pain Interference Pain Interference Pain Effect on Sleep: 1. Rarely or not at all Pain Interference with Therapy Activities: 1. Rarely or not at all Pain Interference with Day-to-Day Activities: 1. Rarely or not at all Home Living/Prior Functioning Home Living Available Help at Discharge: Family;Available 24 hours/day Type of Home: House Home Access: Stairs to enter [employer] of Steps: 3 Entrance Stairs-Rails: None Home Layout: One level Bathroom Shower/Tub: Tub/shower unit;Curtain Bathroom Toilet: Standard Bathroom Accessibility: Yes Additional Comments: Had DME in the family but donated it. Completely independent PTA  Lives With: Family (mother) Prior Function Level of Independence: Independent with basic ADLs;Independent with transfers;Independent with homemaking with ambulation;Independent with gait  Able to Take Stairs?: Yes Driving: No (does not have a car) Vocation: Unemployed Vision/Perception  Vision - History Ability to See in Adequate Light: 0 Adequate Vision - Assessment Eye Alignment: Within Functional Limits Ocular Range of Motion: Within Functional Limits Alignment/Gaze Preference: Within Defined Limits Tracking/Visual Pursuits: Able to track stimulus in all quads without difficulty Saccades: Decreased speed of saccadic movement Perception Perception: Impaired Praxis Praxis: Impaired Praxis Impairment Details: Motor planning  Cognition Overall Cognitive Status: Impaired/Different from baseline Arousal/Alertness: Awake/alert Orientation Level: Oriented X4 Focused Attention: Appears intact Sustained Attention: Impaired Memory: Appears intact Awareness: Impaired Executive Function: Self Correcting Self Correcting: Impaired Behaviors: Other (comment) (flat  affect, mildly lethargic) Safety/Judgment: Impaired Comments: mild impulsivity Sensation Sensation Light Touch: Impaired by gross assessment Additional Comments: Reports dullness of sensation on L hemi body but WFL with assessment Coordination Gross Motor Movements are Fluid and Coordinated: No Fine Motor Movements are Fluid and Coordinated: No Heel Shin Test: WFL but slow with LLE Motor  Motor Motor: Other (comment) Motor - Skilled Clinical Observations: mild L hemiparesis   Trunk/Postural Assessment  Cervical Assessment Cervical Assessment: Within Functional Limits Thoracic Assessment Thoracic Assessment: Within Functional Limits Lumbar Assessment Lumbar Assessment: Within Functional Limits Postural Control Postural Control: Deficits on evaluation Righting Reactions: delayed Protective  Responses: delayed  Balance Balance Balance Assessed: Yes Standardized Balance Assessment Standardized Balance Assessment: Berg Balance Test;Timed Up and Go Test Berg Balance Test Sit to Stand: Able to stand  independently using hands Standing Unsupported: Able to stand safely 2 minutes Sitting with Back Unsupported but Feet Supported on Floor or Stool: Able to sit safely and securely 2 minutes Stand to Sit: Controls descent by using hands Transfers: Able to transfer safely, minor use of hands Standing Unsupported with Eyes Closed: Able to stand 10 seconds with supervision Standing Ubsupported with Feet Together: Able to place feet together independently and stand for 1 minute with supervision From Standing, Reach Forward with Outstretched Arm: Can reach forward >12 cm safely (5) From Standing Position, Pick up Object from Floor: Able to pick up shoe, needs supervision From Standing Position, Turn to Look Behind Over each Shoulder: Looks behind one side only/other side shows less weight shift Turn 360 Degrees: Able to turn 360 degrees safely but slowly Standing Unsupported, Alternately  Place Feet on Step/Stool: Able to complete 4 steps without aid or supervision Standing Unsupported, One Foot in Front: Able to plae foot ahead of the other independently and hold 30 seconds Standing on One Leg: Able to lift leg independently and hold equal to or more than 3 seconds Total Score: 42 Timed Up and Go Test TUG: Normal TUG Normal TUG (seconds): 23.32 Static Sitting Balance Static Sitting - Balance Support: No upper extremity supported;Feet supported Static Sitting - Level of Assistance: 6: Modified independent (Device/Increase time) Dynamic Sitting Balance Dynamic Sitting - Balance Support: No upper extremity supported;Feet supported Dynamic Sitting - Level of Assistance: 5: Stand by assistance Static Standing Balance Static Standing - Balance Support: During functional activity;No upper extremity supported Static Standing - Level of Assistance: 5: Stand by assistance Dynamic Standing Balance Dynamic Standing - Balance Support: During functional activity;No upper extremity supported Dynamic Standing - Level of Assistance: Other (comment) (CGA) Extremity Assessment      RLE Assessment RLE Assessment: Within Functional Limits LLE Assessment LLE Assessment: Exceptions to [facility] LLE Strength Left Hip Flexion: 4-/5 Left Hip Extension: 4-/5 Left Hip ABduction: 4-/5 Left Hip ADduction: 4-/5 Left Knee Flexion: 4-/5 Left Knee Extension: 4-/5 Left Ankle Dorsiflexion: 4/5 Left Ankle Plantar Flexion: 4/5  Care Tool Care Tool Bed Mobility Roll left and right activity   Roll left and right assist level: Independent with assistive device    Sit to lying activity   Sit to lying assist level: Independent with assistive device    Lying to sitting on side of bed activity   Lying to sitting on side of bed assist level: the ability to move from lying on the back to sitting on the side of the bed with no back support.: Independent with assistive device     Care Tool Transfers Sit  to stand transfer   Sit to stand assist level: Contact Guard/Touching assist    Chair/bed transfer   Chair/bed transfer assist level: Contact Guard/Touching assist    Car transfer   Car transfer assist level: Contact Guard/Touching assist      Care Tool Locomotion Ambulation   Assist level: Contact Guard/Touching assist Assistive device: No Device Max distance: 250 ft  Walk 10 feet activity   Assist level: Contact Guard/Touching assist Assistive device: No Device   Walk 50 feet with 2 turns activity   Assist level: Contact Guard/Touching assist Assistive device: No Device  Walk 150 feet activity   Assist level: Contact Guard/Touching assist Assistive device:  No Device  Walk 10 feet on uneven surfaces activity   Assist level: Minimal Assistance - Patient > 75% Assistive device: Other (comment) (no device)  Stairs   Assist level: Minimal Assistance - Patient > 75% Stairs assistive device: No device Max number of stairs: 8  Walk up/down 1 step activity   Walk up/down 1 step (curb) assist level: Contact Guard/Touching assist Walk up/down 1 step or curb assistive device: No device  Walk up/down 4 steps activity   Walk up/down 4 steps assist level: Minimal Assistance - Patient > 75% Walk up/down 4 steps assistive device: No device  Walk up/down 12 steps activity Walk up/down 12 steps activity did not occur: Safety/medical concerns      Pick up small objects from floor   Pick up small object from the floor assist level: Contact Guard/Touching assist    Wheelchair Is the patient using a wheelchair?: No (Did not use during eval and not expected to use after d/c)   Wheelchair activity did not occur: N/A      Wheel 50 feet with 2 turns activity Wheelchair 50 feet with 2 turns activity did not occur: N/A    Wheel 150 feet activity Wheelchair 150 feet activity did not occur: N/A      Refer to Care Plan for Long Term Goals  SHORT TERM GOAL WEEK 1 PT Short Term Goal 1 (Week  1): STG = LTG d/t ELOS  Recommendations for other services: Adult nurse group, Stress management, and Outing/community reintegration  Skilled Therapeutic Intervention Mobility Bed Mobility Bed Mobility: Rolling Right;Rolling Left;Left Sidelying to Sit Rolling Right: Independent with assistive device Rolling Left: Independent with assistive device Left Sidelying to Sit: Independent with assistive device Transfers Transfers: Sit to Stand;Stand Pivot Transfers;Stand to Sit Sit to Stand: Contact Guard/Touching assist Stand to Sit: Supervision/Verbal cueing;Contact Guard/Touching assist Stand Pivot Transfers: Contact Guard/Touching assist Transfer (Assistive device): None Locomotion  Gait Ambulation: Yes Gait Assistance: Contact Guard/Touching assist;Minimal Assistance - Patient > 75% Gait Distance (Feet): 200 Feet Assistive device: None Gait Gait: Yes Gait Pattern: Impaired Gait Pattern: Step-through pattern;Decreased step length - left;Decreased weight shift to left Gait velocity: decreased/ 0.362m/s - indicative of a household walker Stairs / Additional Locomotion Stairs: Yes Stairs Assistance: Contact Guard/Touching assist Stair Management Technique: No rails Number of Stairs: 8 Height of Stairs: 6 Ramp: Contact Guard/touching assist Curb: Engineer, manufacturing Wheelchair Mobility: No  Skilled Intervention: PT Evaluation completed; see above for results. PT educated patient in roles of PT vs OT, PT POC, rehab potential, rehab goals, and discharge recommendations along with recommendation for follow-up rehabilitation services. Individual treatment initiated:  Patient supine in bed upon PT arrival. Patient alert and agreeable to PT session.   No pain complaint at start of session.  Therapeutic Activity: Bed Mobility: Patient performed supine <> sit with distant supervision. No cues provided. Seated balance good throughout  dynamic  reaching activities.  Transfers: Patient performed sit <> stand transfers throughout session with light CGA and stand pivot with CGA with heavy tc at times for balance.   Gait Training:  Patient ambulated >200' x2 using no AD with CGA/ minA for balance and mild LLE weakness. Provided vc/tc for midline, upright posture, level gaze, and increasing focus to LLE performance to maintain strength.  Neuromuscular Re-ed: NMR facilitated during session with focus on standing balance. Pt guided in Berg Balance Test and TUG for fal risk and balance assessment. See scores above. NMR performed for improvements in  motor control and coordination, balance, sequencing, judgement, and self confidence/ efficacy in performing all aspects of mobility at highest level of independence.   Patient supine in bed at end of session with brakes locked, bed alarm set, and all needs within reach.  Discharge Criteria: Patient will be discharged from PT if patient refuses treatment 3 consecutive times without medical reason, if treatment goals not met, if there is a change in medical status, if patient makes no progress towards goals or if patient is discharged from hospital.  The above assessment, treatment plan, treatment alternatives and goals were discussed and mutually agreed upon: by patient  Mliss DELENA Milliner PT, DPT, CSRS 04/01/2024, 5:05 PM

## 2024-04-01 NOTE — Progress Notes (Signed)
 Patient ID: Terri [name], female   DOB: [birth year], 48 y.o.   MRN: [id number] Met with the patient to review current medical status, rehab process, team conference and plan of care. Discussed secondary risk management including smoking cessation, OSA, with DM (A1C 13.7); noted aware of need for insulin  administration and has practiced with the prefilled syringe/pen. Reviewed DVT prophylaxis; Eliquis  along with  HTN and HLD (LDL 132/Trig 432). Reviewed HH/CMM dietary modification recommendations. Patient given information for establishment of a PCP at [facility] on April 22, 2024 at 1:20 p.m. Continue to follow along to address educational needs to facilitate preparation for discharge. Fredericka Barnie NOVAK

## 2024-04-01 NOTE — Plan of Care (Signed)
  Problem: Consults Goal: RH STROKE PATIENT EDUCATION Description: See Patient Education module for education specifics  Outcome: Progressing   Problem: RH SAFETY Goal: RH STG ADHERE TO SAFETY PRECAUTIONS W/ASSISTANCE/DEVICE Description: STG Adhere to Safety Precautions With cues Assistance/Device. Outcome: Progressing   Problem: RH PAIN MANAGEMENT Goal: RH STG PAIN MANAGED AT OR BELOW PT'S PAIN GOAL Description: Pain < 4 with prns Outcome: Progressing   Problem: RH KNOWLEDGE DEFICIT Goal: RH STG INCREASE KNOWLEDGE OF DIABETES Description: Patient and mom will be able to manage DM using educational resources for medications, and dietary modification independently Outcome: Progressing Goal: RH STG INCREASE KNOWLEDGE OF HYPERTENSION Description: Patient and mom will be able to manage HTN using educational resources for medications, and dietary modification independently Outcome: Progressing Goal: RH STG INCREASE KNOWLEGDE OF HYPERLIPIDEMIA Description: Patient and mom will be able to manage HLD using educational resources for medications, and dietary modification independently Outcome: Progressing Goal: RH STG INCREASE KNOWLEDGE OF STROKE PROPHYLAXIS Description: Patient and mom will be able to manage secondary risks using educational resources for medications, and dietary modification independently Outcome: Progressing   Problem: Education: Goal: Ability to describe self-care measures that may prevent or decrease complications (Diabetes Survival Skills Education) will improve Outcome: Progressing Goal: Individualized Educational Video(s) Outcome: Progressing   Problem: Coping: Goal: Ability to adjust to condition or change in health will improve Outcome: Progressing   Problem: Fluid Volume: Goal: Ability to maintain a balanced intake and output will improve Outcome: Progressing   Problem: Health Behavior/Discharge Planning: Goal: Ability to identify and utilize available  resources and services will improve Outcome: Progressing Goal: Ability to manage health-related needs will improve Outcome: Progressing   Problem: Metabolic: Goal: Ability to maintain appropriate glucose levels will improve Outcome: Progressing   Problem: Nutritional: Goal: Maintenance of adequate nutrition will improve Outcome: Progressing Goal: Progress toward achieving an optimal weight will improve Outcome: Progressing   Problem: Skin Integrity: Goal: Risk for impaired skin integrity will decrease Outcome: Progressing   Problem: Tissue Perfusion: Goal: Adequacy of tissue perfusion will improve Outcome: Progressing

## 2024-04-01 NOTE — Progress Notes (Signed)
 PMR Admission Coordinator Pre-Admission Assessment   Patient: Terri [name] is an 48 y.o., female MRN: [id number] DOB: [birth year] Height: 5' 2 (157.5 cm) Weight: 62.1 kg   Insurance Information HMO:     PPO:      PCP:      IPA:      80/20:      OTHER:  PRIMARY: Uninsured   SECONDARY:       Policy#:      Phone#:    Artist:       Phone#:    The Engineer, materials Information Summary" for patients in Inpatient Rehabilitation Facilities with attached "Privacy Act Statement-Health Care Records" was provided and verbally reviewed with: N/A   Emergency Contact Information Contact Information       Name Relation Home Work Mobile    Montagna,Carolyn Mother [phone]   [phone]         Other Contacts   None on File        Current Medical History  Patient Admitting Diagnosis: CVA History of Present Illness: Pt. Is a 48 year old female with PMH as significant for DVT, Bipolar, Schizoaffective disorder, and migraines who  presented to [name] 8/4/25with complaints of weakness and AMS. Onset of symptoms was approximately 8/3 around 2100. There were also witnessed episodes of seizure like activity (body tightening and arms jumping). Upon arrival to the ED she was found to have flaccid paralysis of the left upper and lower extremities. She was treated as a Code Stroke and was taken immediately to the CT scanner, which showed no LVO, but was positive for acute thrombus in both common carotid arteries as well as enhancement in the right cerebral hemisphere concerning for subacute infarcts. Petechial hemorrhage also seen. She was evaluated by neurology and had a witnessed seizure which was successfully abated with IV ativan . Heparin  infusion started by neurology after weighing risks considering petechial hemorrhage. CT head right frontal chronic and subacute infarcts with mild hemorrhagic transformation CT head and neck showed bilateral CCA thrombus, right more than  left.  Multifocal abnormal enhancement in the right cerebral hemisphere, suggestive of enhancing subacute infarcts with epidural hemorrhage. MRI with and without contrast showed Multiple foci of T2 hyperintensity and cortical enhancement in the right MCA territory with associated petechial hemorrhage and some restricted diffusion, consistent with subacute infarcts.  2D Echo EF 70 to 75%. Pt. Seen by PT/OT/SLP and they recommend CIR to assist return to PLOF.  Complete NIHSS TOTAL: 3   Patient's medical record from [facility]  has been reviewed by the rehabilitation admission coordinator and physician.   Past Medical History      Past Medical History:  Diagnosis Date   DEPRESSION 10/18/2009   ENDOMETRIOSIS 10/18/2009   GERD 10/18/2009   History of HPV infection     MIGRAINE HEADACHE 10/18/2009   UNSPECIFIED PERIPHERAL VASCULAR DISEASE 10/18/2009          Has the patient had major surgery during 100 days prior to admission? No   Family History   family history includes Diabetes in her father and mother; Heart disease (age of onset: 38) in her father; Hyperlipidemia in her mother; Hypertension in her father; Kidney disease in her father; Prostate cancer in her father.   Current Medications  Current Medications    Current Facility-Administered Medications:    acetaminophen  (TYLENOL ) tablet 650 mg, 650 mg, Oral, Q6H PRN, Waddell Aquas A, NP, 650 mg at 03/30/24 1147   atorvastatin  (LIPITOR ) tablet  80 mg, 80 mg, Oral, Daily, Jerri Pfeiffer, MD, 80 mg at 03/30/24 1018   Chlorhexidine  Gluconate Cloth 2 % PADS 6 each, 6 each, Topical, Daily, Rosan Deward ORN, NP, 6 each at 03/29/24 9078   heparin  ADULT infusion 100 units/mL (25000 units/250mL), 1,150 Units/hr, Intravenous, Continuous, Billy Rocky SAUNDERS, RPH, Last Rate: 11.5 mL/hr at 03/30/24 1327, 1,150 Units/hr at 03/30/24 1327   insulin  aspart (novoLOG ) injection 0-20 Units, 0-20 Units, Subcutaneous, Q4H, Ramaswamy, Murali, MD, 7  Units at 03/30/24 1141   insulin  aspart (novoLOG ) injection 3 Units, 3 Units, Subcutaneous, TID WC, Gonfa, Taye T, MD, 3 Units at 03/30/24 1142   insulin  glargine-yfgn (SEMGLEE ) injection 8 Units, 8 Units, Subcutaneous, BID, Hoffman, Paul W, NP, 8 Units at 03/30/24 1018   levETIRAcetam  (KEPPRA ) tablet 1,000 mg, 1,000 mg, Oral, BID, Ramaswamy, Murali, MD, 1,000 mg at 03/30/24 1018   ondansetron  (ZOFRAN ) injection 4 mg, 4 mg, Intravenous, Q6H PRN, Jerri Pfeiffer, MD, 4 mg at 03/29/24 1824   polyethylene glycol (MIRALAX  / GLYCOLAX ) packet 17 g, 17 g, Oral, Daily PRN, Rosan Deward ORN, NP     Patients Current Diet:  Diet Order                  Diet regular Room service appropriate? Yes; Fluid consistency: Thin  Diet effective now                         Precautions / Restrictions Precautions Precautions: Fall Restrictions Weight Bearing Restrictions Per Provider Order: No    Has the patient had 2 or more falls or a fall with injury in the past year? Yes   Prior Activity Level Community (5-7x/wk): Pt. active in the community PTA   Prior Functional Level Self Care: Did the patient need help bathing, dressing, using the toilet or eating? Independent   Indoor Mobility: Did the patient need assistance with walking from room to room (with or without device)? Independent   Stairs: Did the patient need assistance with internal or external stairs (with or without device)? Independent   Functional Cognition: Did the patient need help planning regular tasks such as shopping or remembering to take medications? Independent   Patient Information Are you of Hispanic, Latino/a,or Spanish origin?: A. No, not of Hispanic, Latino/a, or Spanish origin What is your race?: B. Black or African American Do you need or want an interpreter to communicate with a doctor or health care staff?: 0. No   Patient's Response To:  Health Literacy and Transportation Is the patient able to respond to health  literacy and transportation needs?: Yes Health Literacy - How often do you need to have someone help you when you read instructions, pamphlets, or other written material from your doctor or pharmacy?: Never In the past 12 months, has lack of transportation kept you from medical appointments or from getting medications?: No In the past 12 months, has lack of transportation kept you from meetings, work, or from getting things needed for daily living?: No   Home Assistive Devices / Equipment Home Equipment: Shower seat   Prior Device Use: Indicate devices/aids used by the patient prior to current illness, exacerbation or injury? None of the above   Current Functional Level Cognition   Arousal/Alertness: Awake/alert Overall Cognitive Status: Impaired/Different from baseline Orientation Level: Oriented X4 Attention: Focused, Sustained Focused Attention: Appears intact Sustained Attention: Impaired Sustained Attention Impairment: Verbal complex Memory: Impaired Memory Impairment: Retrieval deficit, Decreased recall of new information (Immediate:  5/5; delayed: 0/5; with cues: 2/5) Awareness: Impaired Awareness Impairment: Intellectual impairment Problem Solving: Impaired Problem Solving Impairment: Verbal complex (0/2) Executive Function: Sequencing, Adult nurse, Reasoning Reasoning: Impaired Reasoning Impairment: Verbal complex Sequencing: Impaired Sequencing Impairment: Verbal complex Organizing: Impaired Organizing Impairment: Verbal complex    Extremity Assessment (includes Sensation/Coordination)   Upper Extremity Assessment: LUE deficits/detail LUE Deficits / Details: 3+/5 compared to RUE, decr coordinaton  Lower Extremity Assessment: Defer to PT evaluation LLE Deficits / Details: grossly 3-/5, impaired coordination     ADLs   Overall ADL's : Needs assistance/impaired Eating/Feeding: Set up Grooming: Contact guard assist, Standing, Oral care Upper Body Bathing: Minimal  assistance, Sitting, Standing Lower Body Bathing: Moderate assistance, Sit to/from stand, Sitting/lateral leans Upper Body Dressing : Minimal assistance, Sitting, Standing Lower Body Dressing: Moderate assistance, Sitting/lateral leans, Sit to/from stand Toilet Transfer: Minimal assistance, Ambulation Functional mobility during ADLs: Minimal assistance     Mobility   Overal bed mobility: Needs Assistance Bed Mobility: Supine to Sit Supine to sit: Min assist General bed mobility comments: increased time, minA for trunk, pt guarded, assist for line management     Transfers   Overall transfer level: Needs assistance Equipment used: 1 person hand held assist Transfers: Sit to/from Stand Sit to Stand: Min assist General transfer comment: minA to power up and steady, pt guarded and cautious reporting i'm definately unsteady, wide base of support     Ambulation / Gait / Stairs / Wheelchair Mobility   Ambulation/Gait Ambulation/Gait assistance: Min assist, Mod assist Gait Distance (Feet): 100 Feet Assistive device: IV Pole Gait Pattern/deviations: Step-to pattern, Decreased stride length, Decreased dorsiflexion - left, Decreased weight shift to left, Knee flexed in stance - left General Gait Details: pt with noted L knee instability, decreased step length, difficulty keeping L hand on IV pole due to weak grip. Pt with L sided antalgia, pt with increased L knee flexion during ambulation with onset of fatigue Gait velocity: dec Gait velocity interpretation: <1.8 ft/sec, indicate of risk for recurrent falls     Posture / Balance Balance Overall balance assessment: Needs assistance Sitting-balance support: Feet supported, No upper extremity supported Sitting balance-Leahy Scale: Fair Standing balance support: Bilateral upper extremity supported, During functional activity, Reliant on assistive device for balance Standing balance-Leahy Scale: Fair Standing balance comment: static standing  without AD     Special considerations/life events  Behavioral consideration hx of schizoaffective and biploar disorders    Previous Home Environment (from acute therapy documentation) Living Arrangements: Parent (mom)  Lives With: Family Available Help at Discharge: Family, Available 24 hours/day Type of Home: House Home Layout: One level Home Access: Stairs to enter Entrance Stairs-Rails: None Entrance Stairs-Number of Steps: 3 Bathroom Shower/Tub: Associate Professor: Yes How Accessible: Accessible via walker Home Care Services: No Additional Comments: unsure of accuracy of home situation as patient states that sometimes she lives with her mom but yet her mom can stay with her all the time if needed   Discharge Living Setting Plans for Discharge Living Setting: Patient's home Type of Home at Discharge: House Discharge Home Layout: One level Discharge Home Access: Stairs to enter Entrance Stairs-Rails: None Entrance Stairs-Number of Steps: 3 Discharge Bathroom Shower/Tub: Tub/shower unit Discharge Bathroom Toilet: Standard Discharge Bathroom Accessibility: Yes How Accessible: Accessible via walker Does the patient have any problems obtaining your medications?: No   Social/Family/Support Systems Patient Roles: Parent Contact Information: [phone] Anticipated Caregiver's Contact Information: 24/7 min A Caregiver Availability: 24/7 Discharge Plan Discussed with  Primary Caregiver: Yes Is Caregiver In Agreement with Plan?: No Does Caregiver/Family have Issues with Lodging/Transportation while Pt is in Rehab?: No   Goals Patient/Family Goal for Rehab: PT/OT/SLP Supervision Expected length of stay: 10-12 days Pt/Family Agrees to Admission and willing to participate: Yes Program Orientation Provided & Reviewed with Pt/Caregiver Including Roles  & Responsibilities: Yes   Decrease burden of Care through IP rehab admission: not  anticipated   Possible need for SNF placement upon discharge: Not anticipated   Patient Condition: I have reviewed medical records from [facility], spoken with CM, and family member. I met with patient at the bedside for inpatient rehabilitation assessment.  Patient will benefit from ongoing PT, OT, and SLP, can actively participate in 3 hours of therapy a day 5 days of the week, and can make measurable gains during the admission.  Patient will also benefit from the coordinated team approach during an Inpatient Acute Rehabilitation admission.  The patient will receive intensive therapy as well as Rehabilitation physician, nursing, social worker, and care management interventions.  Due to safety, skin/wound care, disease management, medication administration, pain management, and patient education the patient requires 24 hour a day rehabilitation nursing.  The patient is currently min A  with mobility and basic ADLs.  Discharge setting and therapy post discharge at home with home health is anticipated.  Patient has agreed to participate in the Acute Inpatient Rehabilitation Program and will admit today.   Preadmission Screen Completed By:  Leita KATHEE Kleine, 03/30/2024 1:35 PM ______________________________________________________________________   Discussed status with Dr. Lovorn on 03/31/24 at 900 and received approval for admission today.   Admission Coordinator:  Leita KATHEE Kleine, CCC-SLP, time 1110/Date 03/31/24    Assessment/Plan: Diagnosis: R MCA infarct Does the need for close, 24 hr/day Medical supervision in concert with the patient's rehab needs make it unreasonable for this patient to be served in a less intensive setting? Yes Co-Morbidities requiring supervision/potential complications:  Schizoaffective d/o; Bipolar d/o;  migraines, DVT; seizures Due to bladder management, bowel management, safety, skin/wound care, disease management, medication administration, pain management, and  patient education, does the patient require 24 hr/day rehab nursing? Yes Does the patient require coordinated care of a physician, rehab nurse, PT, OT, and SLP to address physical and functional deficits in the context of the above medical diagnosis(es)? Yes Addressing deficits in the following areas: balance, endurance, locomotion, strength, transferring, bowel/bladder control, bathing, dressing, feeding, grooming, toileting, cognition, speech, and language Can the patient actively participate in an intensive therapy program of at least 3 hrs of therapy 5 days a week? Yes The potential for patient to make measurable gains while on inpatient rehab is good Anticipated functional outcomes upon discharge from inpatient rehab: supervision PT, supervision OT, supervision SLP Estimated rehab length of stay to reach the above functional goals is: 10-12 days Anticipated discharge destination: Home 10. Overall Rehab/Functional Prognosis: good     MD Signature:

## 2024-04-01 NOTE — Evaluation (Signed)
 Occupational Therapy Assessment and Plan  Patient Details  Name: Terri [name] MRN: [id number] Date of Birth: 1975-09-28  OT Diagnosis: acute pain, cognitive deficits, muscle weakness (generalized), and hemiparesis Rehab Potential: Rehab Potential (ACUTE ONLY): Good ELOS: 5-7 days   Today's Date: 04/01/2024 OT Individual Time: 0850-1000 OT Individual Time Calculation (min): 70 min     Hospital Problem: Principal Problem:   Acute ischemic right MCA stroke [facility])   Past Medical History:  Past Medical History:  Diagnosis Date   Allergies    nasal congestion/eyes get itchy   DEPRESSION 10/18/2009   DVT (deep vein thrombosis) in pregnancy 2012   In setting of tobacco use/OCP   ENDOMETRIOSIS 10/18/2009   GERD 10/18/2009   History of HPV infection    MIGRAINE HEADACHE 10/18/2009   UNSPECIFIED PERIPHERAL VASCULAR DISEASE 10/18/2009   embolization to 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 Terri [name] is a 48 year R handed old female with  history of DVT, bipolar d/o, depression, migraines, dizziness and weakness for a week'\, who was admitted on 03/28/24 with witnessed seizure type episode, weakness and AMS. She was found to have flaccid left hemiplegia and CT head done showed acute thrombus in both CCA with abnormal enhancement in right hemisphere and petechial hemorrhage in right middle frontal gyrus area.  She ws loaded with Keppa 2 gram followed by 1 gram BID.  Neurology recommended ruling out cause of emboli and anticoagulation benefits outweighed risks as  well as tight control of BS. She was started on IV heaprin.  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 CTA chest negative for PE and hazy opacities in RLL likely combination of edema and atelectasis. BLE dopplers were negative for DVT.  2 D echo was negative for shunt but  TEE showed intrapulmonary shunt with EF 65-70% and no thrombus seen.    MRI brain done on 08/05 revealing multiple foci of T2 hyperintensity and cortical enhancement in R-MCA territory (right middle frontal gyrus, central sulcus, right temporal and parietal lobes) with associated petechial hemorrhage, remote infarct and encephalomalacia in right parietal lobe. Autoimmune panel and Hypercoagulopathy panel  negative.  EEG negative for seizures. She has been transitioned to Eliquis  with recommendations to repeat CTA head/neck in 2-3 months for follow up and decision to transition to anti-platelets if thrombus resolved. She continues to be  limited by left sided weakness with sensory deficits and needs cues for higher level cognitive tasks.    PT/OT has been working with patient who requires min assist with ADLs and CGA with mobility. She was independent PTA and CIR recommended due to functional decline. Patient transferred to CIR on 03/31/2024 .    Patient currently requires CGA with basic self-care skills secondary to muscle weakness, decreased cardiorespiratoy endurance, decreased coordination and decreased motor planning, decreased safety awareness, and decreased standing balance and hemiparesis.  Prior to hospitalization, patient could  complete self-care independently.  Patient will benefit from skilled intervention to increase independence with basic self-care skills and increase level of independence with iADL prior to  discharge home with care partner.  Anticipate patient will require intermittent supervision and follow up outpatient.  OT - End of Session Activity Tolerance: Tolerates 10 - 20 min activity with multiple rests Endurance Deficit: Yes OT Assessment Rehab Potential (ACUTE ONLY): Good OT Barriers to Discharge: Home environment access/layout OT Patient demonstrates impairments in the following area(s): Balance;Endurance;Motor;Sensory;Safety;Cognition OT Basic ADL's Functional Problem(s): Bathing;Dressing;Toileting OT Advanced ADL's Functional Problem(s): Simple Meal Preparation OT Transfers Functional Problem(s): Toilet;Tub/Shower OT Additional Impairment(s): None OT Plan OT Intensity: Minimum of 1-2 x/day, 45 to 90 minutes OT Frequency: 5 out of 7 days OT Duration/Estimated Length of Stay: 5-7 days OT Treatment/Interventions: Balance/vestibular training;Discharge planning;Pain management;Self Care/advanced ADL retraining;Therapeutic Activities;UE/LE Coordination activities;Cognitive remediation/compensation;Disease mangement/prevention;Functional mobility training;Patient/family education;Skin care/wound managment;Therapeutic Exercise;DME/adaptive equipment instruction;Neuromuscular re-education;UE/LE Strength taining/ROM OT Self Feeding Anticipated Outcome(s): Independent OT Basic Self-Care Anticipated Outcome(s): Mod I OT Toileting Anticipated Outcome(s): Mod I OT Bathroom Transfers Anticipated Outcome(s): Mod I OT Recommendation Recommendations for Other Services: Therapeutic Recreation consult Therapeutic Recreation Interventions: Pet therapy Patient destination: Home Follow Up Recommendations: Outpatient OT Equipment Recommended: To be determined   OT Evaluation Precautions/Restrictions  Precautions Precautions: Fall Precaution/Restrictions Comments: L hemiparesis Restrictions Weight Bearing Restrictions Per Provider Order: No Home Living/Prior Functioning Home  Living Family/patient expects to be discharged to:: Private residence Living Arrangements: Parent Available Help at Discharge: Family, Available 24 hours/day (mother is retired but is in/out with social activites) Type of Home: House Home Access: Stairs to enter Secretary/administrator of Steps: 3 Entrance Stairs-Rails: None Home Layout: One level Bathroom Shower/Tub: Tub/shower unit, Engineer, building services: Standard Bathroom Accessibility: Yes Additional Comments: Had DME in the family but donated it. Completely independent PTA  Lives With: Family (reports she moved in with her mom PTA) IADL History Homemaking Responsibilities: Yes Meal Prep Responsibility: Primary Laundry Responsibility: Primary Current License: No Occupation: Unemployed Leisure and Hobbies: reading and music, video games Prior Function Level of Independence: Independent with basic ADLs, Independent with transfers, Independent with homemaking with ambulation, Independent with gait  Able to Take Stairs?: Yes Vision Baseline Vision/History: 1 Wears glasses (readers) Ability to See in Adequate Light: 0 Adequate Patient Visual Report: Eye fatigue/eye pain/headache Vision Assessment?: Yes Eye Alignment: Within Functional Limits Ocular Range of Motion: Within Functional Limits Alignment/Gaze Preference: Within Defined Limits Tracking/Visual Pursuits: Able to track stimulus in all quads without difficulty Saccades: Decreased speed of saccadic movement Convergence: Within functional limits Visual Fields: No apparent deficits Perception  Perception: Impaired Praxis Praxis: Impaired Praxis Impairment Details: Motor planning Cognition Cognition Overall Cognitive Status: Impaired/Different from baseline Arousal/Alertness: Lethargic Orientation Level: Person;Place;Situation Person: Oriented Place: Oriented Situation: Oriented Memory: Appears intact Awareness: Appears intact Problem Solving: Appears  intact Safety/Judgment: Impaired Comments: mild impulsivity Brief Interview for Mental Status (BIMS) Repetition of Three Words (First Attempt): 3 Temporal Orientation: Year: Correct Temporal Orientation: Month: Accurate within 5 days Temporal Orientation: Day: Correct Recall: Sock: Yes, no cue required Recall: Blue: Yes, no cue required Recall: Bed: Yes, no cue required BIMS Summary Score: 15 Sensation Sensation Light Touch: Impaired by gross assessment Hot/Cold: Not tested Proprioception: Appears Intact Stereognosis: Not tested Additional Comments: Reports dullness of sensation on L hemi body but WFL with assessment Coordination Gross Motor Movements are Fluid and Coordinated: No Fine Motor Movements are Fluid and Coordinated: No Finger Nose Finger Test: LUE impaired Motor  Motor Motor: Other (comment)  Motor - Skilled Clinical Observations: mild L hemiparesis  Trunk/Postural Assessment  Cervical Assessment Cervical Assessment: Within Functional Limits Thoracic Assessment Thoracic Assessment: Within Functional Limits Lumbar Assessment Lumbar Assessment: Within Functional Limits Postural Control Postural Control: Deficits on evaluation Righting Reactions: delayed  Balance Balance Balance Assessed: Yes Static Sitting Balance Static Sitting - Balance Support: No upper extremity supported Static Sitting - Level of Assistance: 6: Modified independent (Device/Increase time) Dynamic Sitting Balance Dynamic Sitting - Balance Support: No upper extremity supported Dynamic Sitting - Level of Assistance: 5: Stand by assistance (supervision) Static Standing Balance Static Standing - Balance Support: During functional activity;No upper extremity supported Static Standing - Level of Assistance: 5: Stand by assistance (supervision) Dynamic Standing Balance Dynamic Standing - Balance Support: During functional activity;No upper extremity supported Dynamic Standing - Level of  Assistance: 5: Stand by assistance (CGA) Extremity/Trunk Assessment RUE Assessment RUE Assessment: Within Functional Limits LUE Assessment LUE Assessment: Exceptions to [facility] General Strength Comments: 4-/5 grossly  Care Tool Care Tool Self Care Eating   Eating Assist Level: Set up assist    Oral Care    Oral Care Assist Level: Contact Guard/Toucning assist    Bathing   Body parts bathed by patient: Right arm;Left arm;Chest;Abdomen;Front perineal area;Buttocks;Right upper leg;Left upper leg;Right lower leg;Left lower leg;Face     Assist Level: Contact Guard/Touching assist    Upper Body Dressing(including orthotics)   What is the patient wearing?: Pull over shirt   Assist Level: Supervision/Verbal cueing    Lower Body Dressing (excluding footwear)   What is the patient wearing?: Underwear/pull up;Pants Assist for lower body dressing: Contact Guard/Touching assist    Putting on/Taking off footwear   What is the patient wearing?: Non-skid slipper socks Assist for footwear: Set up assist       Care Tool Toileting Toileting activity   Assist for toileting: Contact Guard/Touching assist     Care Tool Bed Mobility Roll left and right activity   Roll left and right assist level: Independent with assistive device    Sit to lying activity   Sit to lying assist level: Independent with assistive device    Lying to sitting on side of bed activity   Lying to sitting on side of bed assist level: the ability to move from lying on the back to sitting on the side of the bed with no back support.: Independent with assistive device     Care Tool Transfers Sit to stand transfer   Sit to stand assist level: Contact Guard/Touching assist    Chair/bed transfer   Chair/bed transfer assist level: Contact Guard/Touching assist     Toilet transfer   Assist Level: Contact Guard/Touching assist     Care Tool Cognition  Expression of Ideas and Wants Expression of Ideas and Wants: 4.  Without difficulty (complex and basic) - expresses complex messages without difficulty and with speech that is clear and easy to understand  Understanding Verbal and Non-Verbal Content Understanding Verbal and Non-Verbal Content: 4. Understands (complex and basic) - clear comprehension without cues or repetitions   Memory/Recall Ability Memory/Recall Ability : Current season;That he or she is in a hospital/hospital unit   Refer to Care Plan for Long Term Goals  SHORT TERM GOAL WEEK 1 OT Short Term Goal 1 (Week 1): STG = LTG due to ELOS  Recommendations for other services: Neuropsych and Therapeutic Recreation  Pet therapy   Skilled Therapeutic Intervention Patient received upright in bed upon therapy arrival and agreeable to participate in OT  evaluation. Education provided on OT purpose, therapy schedule, goals for therapy, and safety policy while in rehab. Tummy and head aching reported; pre-medicated. OT offered rest breaks, repositioning for pain reduction.  Completed bathing/dressing at EOB (see below). Able to stand without AD with CGA and ambulatory transfers up to 200 ft without AD and CGA A for balance. No LLE buckling or formal LOB but pt with deviations in gait and with slow cadence. Cues needed throughout for mild impulsivity (more quick to move), sequencing and safety throughout. Pt remained upright in bed at conclusion of session with bed alarm on and all needs met at end of session.  Patient demonstrates L hemiparesis, dynamic standing balance, GM and LUE fine motor coordination, and mild safety awareness deficits resulting in difficulty completing BADL tasks without increased physical assist. See below for further ADL and functional transfer performance. Pt will benefit from skilled OT services to focus on mentioned deficits.    ADL ADL Eating: Set up Where Assessed-Eating: Bed level Grooming: Contact guard Where Assessed-Grooming: Standing at sink Upper Body Bathing:  Setup Where Assessed-Upper Body Bathing: Edge of bed Lower Body Bathing: Contact guard Where Assessed-Lower Body Bathing: Edge of bed Upper Body Dressing: Supervision/safety Where Assessed-Upper Body Dressing: Edge of bed Lower Body Dressing: Contact guard Where Assessed-Lower Body Dressing: Edge of bed Toileting: Contact guard Where Assessed-Toileting: IT consultant Method: Proofreader: Raised toilet seat Tub/Shower Transfer: Unable to assess Tub/Shower Transfer Method: Unable to assess Film/video editor: Unable to assess Film/video editor Method: Unable to assess Mobility  Bed Mobility Bed Mobility: Rolling Right;Rolling Left;Left Sidelying to Sit Rolling Right: Independent with assistive device Rolling Left: Independent with assistive device Left Sidelying to Sit: Independent with assistive device Transfers Sit to Stand: Contact Guard/Touching assist   Discharge Criteria: Patient will be discharged from OT if patient refuses treatment 3 consecutive times without medical reason, if treatment goals not met, if there is a change in medical status, if patient makes no progress towards goals or if patient is discharged from hospital.  The above assessment, treatment plan, treatment alternatives and goals were discussed and mutually agreed upon: by patient  Lorrayne FORBES Fritter, MS, OTR/L  04/01/2024, 12:24 PM

## 2024-04-01 NOTE — Progress Notes (Signed)
 PROGRESS NOTE   Subjective/Complaints:  Vascular ultrasound negative lower extremities. The patient had a good night.  She is undergoing physical therapy evaluation.  Denies any bowel or bladder issues Review of systems negative chest pain shortness of breath nausea vomiting diarrhea  Objective:   VAS US  LOWER EXTREMITY VENOUS (DVT) Result Date: 03/30/2024  Lower Venous DVT Study Patient Name:  AILEA RHATIGAN  Date of Exam:   03/30/2024 Medical Rec #: [id number]            Accession #:    [id number] Date of Birth: [birth year]           Patient Gender: F Patient Age:   59 years Exam Location:  [facility] Procedure:      VAS US  LOWER EXTREMITY VENOUS (DVT) Referring Phys: MIGNON GONFA --------------------------------------------------------------------------------  Indications: Stroke, and Seizures.  Risk Factors: History of DVT in 2012 and left chronic SFA occlusion. Anticoagulation: Heparin . Comparison Study: No priors. Performing Technologist: Ricka Sturdivant-Jones RDMS, RVT  Examination Guidelines: A complete evaluation includes B-mode imaging, spectral Doppler, color Doppler, and power Doppler as needed of all accessible portions of each vessel. Bilateral testing is considered an integral part of a complete examination. Limited examinations for reoccurring indications may be performed as noted. The reflux portion of the exam is performed with the patient in reverse Trendelenburg.  +---------+---------------+---------+-----------+----------+--------------+ RIGHT    CompressibilityPhasicitySpontaneityPropertiesThrombus Aging +---------+---------------+---------+-----------+----------+--------------+ CFV      Full           Yes      Yes                                 +---------+---------------+---------+-----------+----------+--------------+ SFJ      Full                                                         +---------+---------------+---------+-----------+----------+--------------+ FV Prox  Full                                                        +---------+---------------+---------+-----------+----------+--------------+ FV Mid   Full                                                        +---------+---------------+---------+-----------+----------+--------------+ FV DistalFull                                                        +---------+---------------+---------+-----------+----------+--------------+ PFV  Full                                                        +---------+---------------+---------+-----------+----------+--------------+ POP      Full           Yes      Yes                                 +---------+---------------+---------+-----------+----------+--------------+ PTV      Full                                                        +---------+---------------+---------+-----------+----------+--------------+ PERO     Full                                                        +---------+---------------+---------+-----------+----------+--------------+   +---------+---------------+---------+-----------+----------+--------------+ LEFT     CompressibilityPhasicitySpontaneityPropertiesThrombus Aging +---------+---------------+---------+-----------+----------+--------------+ CFV      Full           Yes      Yes                                 +---------+---------------+---------+-----------+----------+--------------+ SFJ      Full                                                        +---------+---------------+---------+-----------+----------+--------------+ FV Prox  Full                                                        +---------+---------------+---------+-----------+----------+--------------+ FV Mid   Full                                                         +---------+---------------+---------+-----------+----------+--------------+ FV DistalFull                                                        +---------+---------------+---------+-----------+----------+--------------+ PFV      Full                                                        +---------+---------------+---------+-----------+----------+--------------+  POP      Full           Yes      Yes                                 +---------+---------------+---------+-----------+----------+--------------+ PTV      Full                                                        +---------+---------------+---------+-----------+----------+--------------+ PERO     Full                                                        +---------+---------------+---------+-----------+----------+--------------+     Summary: BILATERAL: - No evidence of deep vein thrombosis seen in the lower extremities, bilaterally. -No evidence of popliteal cyst, bilaterally.   *See table(s) above for measurements and observations. Electronically signed by Gaile New MD on 03/30/2024 at 6:10:09 PM.    Final    ECHO TEE Result Date: 03/30/2024    TRANSESOPHOGEAL ECHO REPORT   Patient Name:   LAKODA RASKE Date of Exam: 03/30/2024 Medical Rec #:  [id number]           Height:       62.0 in Accession #:    [id number]          Weight:       137.0 lb Date of Birth:  13-Jun-1976          BSA:          1.628 m Patient Age:    47 years            BP:           118/98 mmHg Patient Gender: F                   HR:           104 bpm. Exam Location:  Inpatient Procedure: Transesophageal Echo, Cardiac Doppler, Color Doppler and 3D Echo            (Both Spectral and Color Flow Doppler were utilized during            procedure). Indications:     Stroke  History:         Patient has prior history of Echocardiogram examinations, most                  recent 03/28/2024. Stroke and Migraine.  Sonographer:     Thea Norlander RCS  Referring Phys:  [id number] THOM CROME HALEY Diagnosing Phys: Darryle Decent MD PROCEDURE: After discussion of the risks and benefits of a TEE, an informed consent was obtained from the patient. TEE procedure time was 12 minutes. The transesophogeal probe was passed without difficulty through the esophogus of the patient. Imaged were obtained with the patient in a left lateral decubitus position. Sedation performed by different physician. The patient was monitored while under deep sedation. Anesthestetic sedation was provided intravenously by Anesthesiology: 491.23mg  of Propofol , 100mg  of Lidocaine . Image quality was excellent. The patient's vital signs; including heart rate, blood pressure,  and oxygen saturation; remained stable throughout the procedure. The patient developed no complications during the procedure.  IMPRESSIONS  1. Bubble study shows crossing >6 cardiac cycles which is more consistent with intrapulmonary shunt.  2. Left ventricular ejection fraction, by estimation, is 65 to 70%. The left ventricle has normal function.  3. Right ventricular systolic function is normal. The right ventricular size is normal.  4. No left atrial/left atrial appendage thrombus was detected. The LAA emptying velocity was 67 cm/s.  5. The mitral valve is grossly normal. No evidence of mitral valve regurgitation. No evidence of mitral stenosis.  6. The aortic valve is tricuspid. Aortic valve regurgitation is not visualized. No aortic stenosis is present.  7. Agitated saline contrast bubble study was positive with shunting observed after >6 cardiac cycles suggestive of intrapulmonary shunting.  8. 3D performed of the mitral valve and demonstrates 3D mitral valve was normal. Conclusion(s)/Recommendation(s): No LA/LAA thrombus identified. Negative bubble study for interatrial shunt. No intracardiac source of embolism detected on this on this transesophageal echocardiogram. FINDINGS  Left Ventricle: Left ventricular ejection fraction,  by estimation, is 65 to 70%. The left ventricle has normal function. The left ventricular internal cavity size was normal in size. Right Ventricle: The right ventricular size is normal. No increase in right ventricular wall thickness. Right ventricular systolic function is normal. Left Atrium: Left atrial size was normal in size. No left atrial/left atrial appendage thrombus was detected. The LAA emptying velocity was 67 cm/s. Right Atrium: Right atrial size was normal in size. Pericardium: There is no evidence of pericardial effusion. Mitral Valve: The mitral valve is grossly normal. No evidence of mitral valve regurgitation. No evidence of mitral valve stenosis. Tricuspid Valve: The tricuspid valve is grossly normal. Tricuspid valve regurgitation is trivial. No evidence of tricuspid stenosis. Aortic Valve: The aortic valve is tricuspid. Aortic valve regurgitation is not visualized. No aortic stenosis is present. Pulmonic Valve: The pulmonic valve was normal in structure. Pulmonic valve regurgitation is not visualized. Aorta: The aortic root and ascending aorta are structurally normal, with no evidence of dilitation. Venous: The left upper pulmonary vein, right lower pulmonary vein, right upper pulmonary vein and left lower pulmonary vein are normal. IAS/Shunts: No atrial level shunt detected by color flow Doppler. Agitated saline contrast was given intravenously to evaluate for intracardiac shunting. Agitated saline contrast bubble study was positive with shunting observed after >6 cardiac cycles suggestive of intrapulmonary shunting. Additional Comments: 3D was performed not requiring image post processing on an independent workstation and was normal. Spectral Doppler performed. LEFT VENTRICLE PLAX 2D LVOT diam:     2.02 cm LVOT Area:     3.20 cm   AORTA Ao Root diam: 2.90 cm Ao Asc diam:  2.72 cm  SHUNTS Systemic Diam: 2.02 cm Darryle Decent MD Electronically signed by Darryle Decent MD Signature Date/Time:  03/30/2024/10:26:55 AM    Final    EP STUDY Result Date: 03/30/2024 See surgical note for result.  Recent Labs    03/30/24 0453 04/01/24 0527  WBC 9.4 7.4  HGB 12.9 13.2  HCT 38.5 40.6  PLT 324 320   Recent Labs    03/31/24 0337 04/01/24 0527  NA 138 140  K 4.5 4.7  CL 106 107  CO2 24 20*  GLUCOSE 147* 214*  BUN 5* 7  CREATININE 0.67 0.64  CALCIUM  8.8* 9.1    Intake/Output Summary (Last 24 hours) at 04/01/2024 0855 Last data filed at 04/01/2024 0800 Gross per 24 hour  Intake  580 ml  Output --  Net 580 ml        Physical Exam: Vital Signs Blood pressure 130/88, pulse 94, temperature 98.8 F (37.1 C), temperature source Oral, resp. rate 19, height 5' 2 (1.575 m), weight 64.4 kg, SpO2 100%.   General: No acute distress Mood and affect are appropriate Heart: Regular rate and rhythm no rubs murmurs or extra sounds Lungs: Clear to auscultation, breathing unlabored, no rales or wheezes Abdomen: Positive bowel sounds, soft nontender to palpation, nondistended Extremities: No clubbing, cyanosis, or edema Skin: No evidence of breakdown, no evidence of rash Neurologic: Cranial nerves II through XII intact, motor strength is 5/5 in Right and 4/5 Left deltoid, bicep, tricep, grip, hip flexor, knee extensors, ankle dorsiflexor and plantar flexor Sensory exam normal sensation to light touch and proprioception in bilateral upper and lower extremities Cerebellar exam normal finger to nose to finger as well as heel to shin in bilateral upper and lower extremities Musculoskeletal: Full range of motion in all 4 extremities. No joint swelling   Assessment/Plan: 1. Functional deficits which require 3+ hours per day of interdisciplinary therapy in a comprehensive inpatient rehab setting. Physiatrist is providing close team supervision and 24 hour management of active medical problems listed below. Physiatrist and rehab team continue to assess barriers to discharge/monitor patient  progress toward functional and medical goals  Care Tool:  Bathing              Bathing assist       Upper Body Dressing/Undressing Upper body dressing        Upper body assist      Lower Body Dressing/Undressing Lower body dressing            Lower body assist       Toileting Toileting    Toileting assist Assist for toileting: Contact Guard/Touching assist     Transfers Chair/bed transfer  Transfers assist     Chair/bed transfer assist level: Contact Guard/Touching assist     Locomotion Ambulation   Ambulation assist              Walk 10 feet activity   Assist           Walk 50 feet activity   Assist           Walk 150 feet activity   Assist           Walk 10 feet on uneven surface  activity   Assist           Wheelchair     Assist               Wheelchair 50 feet with 2 turns activity    Assist            Wheelchair 150 feet activity     Assist          Blood pressure 130/88, pulse 94, temperature 98.8 F (37.1 C), temperature source Oral, resp. rate 19, height 5' 2 (1.575 m), weight 64.4 kg, SpO2 100%.         Medical Problem List and Plan: 1. Functional deficits secondary to Subacute R MCA stroke             -patient may  shower             -ELOS/Goals: 10-12 days Supervision             Admit to CIR, PT OT and speech evaluations today. 2.  H/o DVT/Antithrombotics: -  DVT/anticoagulation:  Pharmaceutical: Eliquis              -antiplatelet therapy: N/A 3. Pain Management: tylenol  prn. Topamax  added for constant HA- wanted to add Elavil since less memory issues, however pt has allergy that is triggered with Pamelor or Elavil-  4. Mood/Behavior/Sleep: LCSW to follow for evaluation and support.              -antipsychotic agents:  N/A 5. Neuropsych/cognition: This patient is capable of making decisions on her own behalf. 6. Skin/Wound Care: Routine pressure relief  measures.  7. Fluids/Electrolytes/Nutrition: Monitor I/O.  8. T2DM: New diagnosis w/A1C-13.7. Now on insulin              --monitor BS ac/hs and use SSI for elevated BS 9. New onset seizure: On Keppra  bid 10. Elevated lipids: Chol-233, LDL 132, Trig-425. Now on Lipitor  80 mg daily.  11. H/o Mood disorder Schizaffective d/o and Bipolar written in chart- not clear since hasn't been on meds-/BPD/Depression: Not on any medications since her 20's.  --Adjustment issues now--wants something mild maybe as needed. Buspar  added 12. GERD/Bloating: Probiotic added. 13. H/o Tobacco/Marijuana use: Educate on importance of cessation      LOS: 1 days A FACE TO FACE EVALUATION WAS PERFORMED  Prentice FORBES Compton 04/01/2024, 8:55 AM

## 2024-04-02 ENCOUNTER — Other Ambulatory Visit (HOSPITAL_COMMUNITY): Payer: Self-pay

## 2024-04-02 DIAGNOSIS — F411 Generalized anxiety disorder: Secondary | ICD-10-CM

## 2024-04-02 DIAGNOSIS — R739 Hyperglycemia, unspecified: Secondary | ICD-10-CM

## 2024-04-02 LAB — CULTURE, BLOOD (ROUTINE X 2)
Culture: NO GROWTH
Culture: NO GROWTH
Special Requests: ADEQUATE

## 2024-04-02 LAB — GLUCOSE, CAPILLARY
Glucose-Capillary: 186 mg/dL — ABNORMAL HIGH (ref 70–99)
Glucose-Capillary: 186 mg/dL — ABNORMAL HIGH (ref 70–99)
Glucose-Capillary: 196 mg/dL — ABNORMAL HIGH (ref 70–99)
Glucose-Capillary: 152 mg/dL — ABNORMAL HIGH (ref 70–99)

## 2024-04-02 NOTE — Progress Notes (Signed)
 Occupational Therapy Session Note  Patient Details  Name: Terri [name] MRN: [id number] Date of Birth: February 25, 1976  Today's Date: 04/02/2024 OT Individual Time: 8950-8841 OT Individual Time Calculation (min): 69 min    Short Term Goals: Week 1:  OT Short Term Goal 1 (Week 1): STG = LTG due to ELOS  Skilled Therapeutic Interventions/Progress Updates:     Pt received sitting up in bed presenting with flat affect, however to be in good spirits receptive to skilled OT session reporting 3/10 pain in stomach- OT offering intermittent rest breaks, repositioning, and therapeutic support to optimize participation in therapy session. Pt reporting she had BM prior to OT session and it helped with the abdominal pain. Pt requesting to take shower this AM- focused this session on ADL retraining with emphasis on hemi-techniques, safety awareness, activity tolerance, and L UE functional use. Engaged Pt in ambulating around her room to gather clothing items- she required CGA-light MIN A for balance presenting with slowed cadence, decreased step length on L LE, and increased lateral weight shifting. She then ambulated to shower with CGA and transferred to TTB positioned in walk-in shower with light MIN A provided for balance during side stepping +verbal cues for technique. Doffed gown with SUP. During shower, facilitated NMRE to L UE by utilizing it for shower head management and ~50% of bathing tasks at a diminished level- increased challenges noted maintaining L UE at >90* shoulder flexion. Pt sat for majority of bathing tasks for energy conservation and safety, standing only to wash buttocks while holding grab bars with CGA provided for safety, no LOB noted. U/LB bathing completed with CGA overall. Following shower, Pt dried self in seated position SUP. She completed functional mobility back to room no AD with CGA provided for balance. Pt able to recall hemi-dressing technique with min questioning cues donning  OH shirt with SUP and pants with CGA when standing to bring pants to waist. She was able to donn bilateral socks SUP +increased time and shoes, including tieing laces with maximal effort. Pt was left resting in bed with call bell in reach, bed alarm on, and all needs met.    Therapy Documentation Precautions:  Precautions Precautions: Fall Precaution/Restrictions Comments: L hemiparesis Restrictions Weight Bearing Restrictions Per Provider Order: No   Therapy/Group: Individual Therapy  [name] 04/02/2024, 7:31 AM

## 2024-04-02 NOTE — Progress Notes (Signed)
 Physical Therapy Session Note  Patient Details  Name: Terri [name] MRN: [id number] Date of Birth: February 03, 1976  Today's Date: 04/02/2024 PT Individual Time: 1301-1359 PT Individual Time Calculation (min): 58 min   Short Term Goals: Week 1:  PT Short Term Goal 1 (Week 1): STG = LTG d/t ELOS  Skilled Therapeutic Interventions/Progress Updates:  Patient supine in bed on entrance to room. Patient alert and agreeable to PT session.   Patient with no pain complaint at start of session.  Therapeutic Activity: Bed Mobility: Pt performed supine <> sit with Mod I requiring no cueing to complete.  Transfers: Pt performed sit<>stand and stand pivot transfers throughout session with CGA/ SBA. Provided vc/ tc for increasing forward lean in order to reduce use of UE.   Gait Training/ NMR:  Pt ambulated 150' x2 ft using no AD with SBA. Demonstrated mild path deviation and reduced ability to dual task with walking/ talking. Provided vc/ tc for focus to maintaining heel-to-toe progression throughout. Guided in FGA. Patient demonstrates medium fall risk as noted by score of 19/30 on  Functional Gait Assessment.   <22/30 = predictive of falls, <20/30 = fall in 6 months, <18/30 = predictive of falls in PD MCID: 5 points stroke population, 4 points geriatric population (ANPTA Core Set of Outcome Measures for Adults with Neurologic Conditions, 2018)   Neuromuscular Re-ed: NMR facilitated during session with focus on dynamic stepping, standing balance. Pt guided in ambulation through agility ladder: One step into each square out and back x2 One step in each square with high knees and slower pace out and back x4 Alternating toe tap with progression of step into next square out and back x4 Alternating step with progression into next square after controlled circling of swing LE around cone x4.  Pt requires CGA with intermittent MinA to complete. Is able to demo stepping strategy with slight delay  but  adequate to maintain balance. Guided in each pass through. Pt demos slight difficulty with initiation of each new task but quickly demos learning with improved balance and no LOB by reaching 3rd bout with each progression.   NMR performed for improvements in motor control and coordination, balance, sequencing, judgement, and self confidence/ efficacy in performing all aspects of mobility at highest level of independence.   Patient supine in bed at end of session with brakes locked, bed alarm set, and all needs within reach.   Therapy Documentation Precautions:  Precautions Precautions: Fall Precaution/Restrictions Comments: L hemiparesis Restrictions Weight Bearing Restrictions Per Provider Order: No  Pain: Pain Assessment Pain Scale: 0-10 Pain Score: 3  Pain Location: Head Pain Intervention(s): Medication (See eMAR) from RN prior to session.   Therapy/Group: Individual Therapy  Mliss DELENA Milliner PT, DPT, CSRS 04/02/2024, 7:00 PM

## 2024-04-02 NOTE — Progress Notes (Signed)
 PROGRESS NOTE   Subjective/Complaints:  Pt doing well, slept ok but was anxious a bit-- did sleep eventually. Pain overall managed. LBM ago. Urinating fine. No other complaints or concerns.   ROS: as per HPI. Denies CP, SOB, abd pain, N/V/D/C, or any other complaints at this time.   +anxiety last night  Objective:   No results found.  Recent Labs    04/01/24 0527  WBC 7.4  HGB 13.2  HCT 40.6  PLT 320   Recent Labs    03/31/24 0337 04/01/24 0527  NA 138 140  K 4.5 4.7  CL 106 107  CO2 24 20*  GLUCOSE 147* 214*  BUN 5* 7  CREATININE 0.67 0.64  CALCIUM  8.8* 9.1    Intake/Output Summary (Last 24 hours) at 04/02/2024 1055 Last data filed at 04/02/2024 0800 Gross per 24 hour  Intake 1000 ml  Output --  Net 1000 ml        Physical Exam: Vital Signs Blood pressure 126/87, pulse 86, temperature 98.5 F (36.9 C), temperature source Oral, resp. rate 16, height 5' 2 (1.575 m), weight 64.3 kg, SpO2 100%.   General: No acute distress, resting comfortably in bed.  Mood and affect are appropriate Heart: Regular rate and rhythm no rubs murmurs or extra sounds Lungs: Clear to auscultation, breathing unlabored, no rales or wheezes Abdomen: Positive bowel sounds, soft nontender to palpation, nondistended Extremities: No clubbing, cyanosis, or edema Skin: No evidence of breakdown, no evidence of rash over exposed surfaces.   PRIOR EXAMS: Neurologic: Cranial nerves II through XII intact, motor strength is 5/5 in Right and 4/5 Left deltoid, bicep, tricep, grip, hip flexor, knee extensors, ankle dorsiflexor and plantar flexor Sensory exam normal sensation to light touch and proprioception in bilateral upper and lower extremities Cerebellar exam normal finger to nose to finger as well as heel to shin in bilateral upper and lower extremities Musculoskeletal: Full range of motion in all 4 extremities. No joint swelling    Assessment/Plan: 1. Functional deficits which require 3+ hours per day of interdisciplinary therapy in a comprehensive inpatient rehab setting. Physiatrist is providing close team supervision and 24 hour management of active medical problems listed below. Physiatrist and rehab team continue to assess barriers to discharge/monitor patient progress toward functional and medical goals  Care Tool:  Bathing    Body parts bathed by patient: Right arm, Left arm, Chest, Abdomen, Front perineal area, Buttocks, Right upper leg, Left upper leg, Right lower leg, Left lower leg, Face         Bathing assist Assist Level: Contact Guard/Touching assist     Upper Body Dressing/Undressing Upper body dressing   What is the patient wearing?: Pull over shirt    Upper body assist Assist Level: Supervision/Verbal cueing    Lower Body Dressing/Undressing Lower body dressing      What is the patient wearing?: Underwear/pull up, Pants     Lower body assist Assist for lower body dressing: Contact Guard/Touching assist     Toileting Toileting    Toileting assist Assist for toileting: Contact Guard/Touching assist     Transfers Chair/bed transfer  Transfers assist     Chair/bed transfer  assist level: Contact Guard/Touching assist     Locomotion Ambulation   Ambulation assist      Assist level: Contact Guard/Touching assist Assistive device: No Device Max distance: 250 ft   Walk 10 feet activity   Assist     Assist level: Contact Guard/Touching assist Assistive device: No Device   Walk 50 feet activity   Assist    Assist level: Contact Guard/Touching assist Assistive device: No Device    Walk 150 feet activity   Assist    Assist level: Contact Guard/Touching assist Assistive device: No Device    Walk 10 feet on uneven surface  activity   Assist     Assist level: Minimal Assistance - Patient > 75% Assistive device: Other (comment) (no device)    Wheelchair     Assist Is the patient using a wheelchair?: No (Did not use during eval and not expected to use after d/c)   Wheelchair activity did not occur: N/A         Wheelchair 50 feet with 2 turns activity    Assist    Wheelchair 50 feet with 2 turns activity did not occur: N/A       Wheelchair 150 feet activity     Assist  Wheelchair 150 feet activity did not occur: N/A       Blood pressure 126/87, pulse 86, temperature 98.5 F (36.9 C), temperature source Oral, resp. rate 16, height 5' 2 (1.575 m), weight 64.3 kg, SpO2 100%.    Medical Problem List and Plan: 1. Functional deficits secondary to Subacute R MCA stroke             -patient may  shower             -ELOS/Goals: 10-12 days Supervision             -Continue CIR 2.  H/o DVT/Antithrombotics: -DVT/anticoagulation:  Pharmaceutical: Eliquis  5mg  BID             -antiplatelet therapy: N/A 3. Pain Management: tylenol  prn. Topamax  25mg  nightly added for constant HA- wanted to add Elavil since less memory issues, however pt has allergy that is triggered with Pamelor or Elavil-  4. Mood/Behavior/Sleep: LCSW to follow for evaluation and support.              -antipsychotic agents:  N/A  -Trazodone  PRN 5. Neuropsych/cognition: This patient is capable of making decisions on her own behalf. 6. Skin/Wound Care: Routine pressure relief measures.  7. Fluids/Electrolytes/Nutrition: Monitor I/O.  8. T2DM: New diagnosis w/A1C-13.7. Now on insulin , Semglee  15U BID, Novolog  4U TID WC             --monitor BS ac/hs and use SSI for elevated BS -04/02/24 CBGs variable mostly <200 but some higher, will see trend before deciding on meds, Metformin  might be a good option to start too? CBG (last 3)  Recent Labs    04/01/24 1655 04/01/24 2124 04/02/24 0546  GLUCAP 163* 212* 186*    9. New onset seizure: On Keppra  1000mg  bid 10. Elevated lipids: Chol-233, LDL 132, Trig-425. Now on Lipitor  80 mg daily.  11. H/o  Mood disorder Schizaffective d/o and Bipolar written in chart- not clear since hasn't been on meds/BPD/Depression: Not on any medications since her 20's.  --Adjustment issues now--wants something mild maybe as needed. Buspar  5mg  TID PRN added 12. GERD/Bloating: Probiotic added. Consider protonix while here? Weekday team to address 13. H/o Tobacco/Marijuana use: Educate on importance of cessation  14. Infection?? Pt  on Doxycycline  100mg  BID starting 04/01/24-- unclear indication, weekday team to clarify  LOS: 2 days A FACE TO FACE EVALUATION WAS PERFORMED  [address] 04/02/2024, 10:55 AM

## 2024-04-02 NOTE — Plan of Care (Signed)
  Problem: Consults Goal: RH STROKE PATIENT EDUCATION Description: See Patient Education module for education specifics  Outcome: Progressing   Problem: RH SAFETY Goal: RH STG ADHERE TO SAFETY PRECAUTIONS W/ASSISTANCE/DEVICE Description: STG Adhere to Safety Precautions With cues Assistance/Device. Outcome: Progressing   Problem: RH PAIN MANAGEMENT Goal: RH STG PAIN MANAGED AT OR BELOW PT'S PAIN GOAL Description: Pain < 4 with prns Outcome: Progressing   Problem: RH KNOWLEDGE DEFICIT Goal: RH STG INCREASE KNOWLEDGE OF DIABETES Description: Patient and mom will be able to manage DM using educational resources for medications, and dietary modification independently Outcome: Progressing Goal: RH STG INCREASE KNOWLEDGE OF HYPERTENSION Description: Patient and mom will be able to manage HTN using educational resources for medications, and dietary modification independently Outcome: Progressing Goal: RH STG INCREASE KNOWLEGDE OF HYPERLIPIDEMIA Description: Patient and mom will be able to manage HLD using educational resources for medications, and dietary modification independently Outcome: Progressing Goal: RH STG INCREASE KNOWLEDGE OF STROKE PROPHYLAXIS Description: Patient and mom will be able to manage secondary risks using educational resources for medications, and dietary modification independently Outcome: Progressing   Problem: Education: Goal: Ability to describe self-care measures that may prevent or decrease complications (Diabetes Survival Skills Education) will improve Outcome: Progressing Goal: Individualized Educational Video(s) Outcome: Progressing   Problem: Coping: Goal: Ability to adjust to condition or change in health will improve Outcome: Progressing   Problem: Fluid Volume: Goal: Ability to maintain a balanced intake and output will improve Outcome: Progressing   Problem: Health Behavior/Discharge Planning: Goal: Ability to identify and utilize available  resources and services will improve Outcome: Progressing Goal: Ability to manage health-related needs will improve Outcome: Progressing   Problem: Metabolic: Goal: Ability to maintain appropriate glucose levels will improve Outcome: Progressing   Problem: Nutritional: Goal: Maintenance of adequate nutrition will improve Outcome: Progressing Goal: Progress toward achieving an optimal weight will improve Outcome: Progressing   Problem: Skin Integrity: Goal: Risk for impaired skin integrity will decrease Outcome: Progressing   Problem: Tissue Perfusion: Goal: Adequacy of tissue perfusion will improve Outcome: Progressing

## 2024-04-02 NOTE — Plan of Care (Signed)
  Problem: RH Balance Goal: LTG Patient will maintain dynamic standing balance (PT) Description: LTG:  Patient will maintain dynamic standing balance with assistance during mobility activities (PT) Flowsheets (Taken 04/02/2024 0558) LTG: Pt will maintain dynamic standing balance during mobility activities with:: Independent with assistive device    Problem: Sit to Stand Goal: LTG:  Patient will perform sit to stand with assistance level (PT) Description: LTG:  Patient will perform sit to stand with assistance level (PT) Flowsheets (Taken 04/02/2024 0558) LTG: PT will perform sit to stand in preparation for functional mobility with assistance level: Independent   Problem: RH Bed to Chair Transfers Goal: LTG Patient will perform bed/chair transfers w/assist (PT) Description: LTG: Patient will perform bed to chair transfers with assistance (PT). Flowsheets (Taken 04/02/2024 0558) LTG: Pt will perform Bed to Chair Transfers with assistance level: Independent with assistive device    Problem: RH Car Transfers Goal: LTG Patient will perform car transfers with assist (PT) Description: LTG: Patient will perform car transfers with assistance (PT). Flowsheets (Taken 04/02/2024 0558) LTG: Pt will perform car transfers with assist:: Supervision/Verbal cueing   Problem: RH Furniture Transfers Goal: LTG Patient will perform furniture transfers w/assist (OT/PT) Description: LTG: Patient will perform furniture transfers  with assistance (OT/PT). Flowsheets (Taken 04/02/2024 0558) LTG: Pt will perform furniture transfers with assist:: Independent with assistive device    Problem: RH Ambulation Goal: LTG Patient will ambulate in home environment (PT) Description: LTG: Patient will ambulate in home environment, # of feet with assistance (PT). Flowsheets (Taken 04/02/2024 0558) LTG: Pt will ambulate in home environ  assist needed:: Independent with assistive device LTG: Ambulation distance in home environment: up  to 50 ft using LRAD Goal: LTG Patient will ambulate in community environment (PT) Description: LTG: Patient will ambulate in community environment, # of feet with assistance (PT). Flowsheets (Taken 04/02/2024 0558) LTG: Pt will ambulate in community environ  assist needed:: Supervision/Verbal cueing LTG: Ambulation distance in community environment: more than 300 ft using LRAD   Problem: RH Stairs Goal: LTG Patient will ambulate up and down stairs w/assist (PT) Description: LTG: Patient will ambulate up and down # of stairs with assistance (PT) Flowsheets (Taken 04/02/2024 0558) LTG: Pt will ambulate up/down stairs assist needed:: Independent with assistive device LTG: Pt will  ambulate up and down number of stairs: at least 3 steps using HR setup as per home environment

## 2024-04-02 NOTE — Progress Notes (Addendum)
 Speech Language Pathology Daily Session Note  Patient Details  Name: Terri [name] DOBRANSKY MRN: [id number] Date of Birth: 08-12-1976  Today's Date: 04/02/2024 SLP Individual Time: 0800-0900 SLP Individual Time Calculation (min): 60 min  Short Term Goals: Week 1: SLP Short Term Goal 1 (Week 1): Patient will solve mildly complex problems during functional iADL-related tasks given supervision cues. SLP Short Term Goal 2 (Week 1): Patient will state cognitive deficits and how they impact performance on functional iADL tasks given supervision cues. SLP Short Term Goal 3 (Week 1): Patient will sustain attention to functional therapy tasks for 20 minutes given min cues.  Skilled Therapeutic Interventions:   Pt and family member greeted at bedside. She was completing breakfast upon SLP arrival and was very pleasant/cooperative throughout tx tasks targeting cognition. SLP facilitated money management via menu. Pt benefited from minA cues for attention to details and calculations. She then benefited from [facility] for information processing and sustained attention during mildly complex written organization task. She reported nausea and eye strain as task progressed d/t detailed nature of task. Verbal task introduced d/t this and she benefited from only s cues for abstract thinking and organization. She demonstrated good awareness of attention and processing deficits, especially re difficulty of written task. At the end of tx tasks, she was left in bed with the alarm set and call light within reach. Recommend cont ST per POC.   Pain  None endorsed  Therapy/Group: Individual Therapy  Recardo DELENA Mole 04/02/2024, 8:16 AM

## 2024-04-03 LAB — GLUCOSE, CAPILLARY
Glucose-Capillary: 174 mg/dL — ABNORMAL HIGH (ref 70–99)
Glucose-Capillary: 191 mg/dL — ABNORMAL HIGH (ref 70–99)
Glucose-Capillary: 175 mg/dL — ABNORMAL HIGH (ref 70–99)
Glucose-Capillary: 200 mg/dL — ABNORMAL HIGH (ref 70–99)

## 2024-04-03 NOTE — Progress Notes (Signed)
 PROGRESS NOTE   Subjective/Complaints:  Pt doing well again, slept ok. Pain overall managed. LBM earlier today. Urinating fine. No other complaints or concerns.   ROS: as per HPI. Denies CP, SOB, abd pain, N/V/D/C, or any other complaints at this time.   +anxiety  Objective:   No results found.  Recent Labs    04/01/24 0527  WBC 7.4  HGB 13.2  HCT 40.6  PLT 320   Recent Labs    04/01/24 0527  NA 140  K 4.7  CL 107  CO2 20*  GLUCOSE 214*  BUN 7  CREATININE 0.64  CALCIUM  9.1    Intake/Output Summary (Last 24 hours) at 04/03/2024 1205 Last data filed at 04/03/2024 0934 Gross per 24 hour  Intake 200 ml  Output --  Net 200 ml         Physical Exam: Vital Signs Blood pressure 125/85, pulse (!) 106, temperature (!) 97.5 F (36.4 C), temperature source Oral, resp. rate 16, height 5' 2 (1.575 m), weight 64.3 kg, SpO2 100%.   General: No acute distress, resting comfortably in bed.  Mood and affect are appropriate, slightly anxious Heart: Regular rate and rhythm no rubs murmurs or extra sounds Lungs: Clear to auscultation, breathing unlabored, no rales or wheezes Abdomen: Positive bowel sounds, soft nontender to palpation, nondistended Extremities: No clubbing, cyanosis, or edema Skin: No evidence of breakdown, no evidence of rash over exposed surfaces.   PRIOR EXAMS: Neurologic: Cranial nerves II through XII intact, motor strength is 5/5 in Right and 4/5 Left deltoid, bicep, tricep, grip, hip flexor, knee extensors, ankle dorsiflexor and plantar flexor Sensory exam normal sensation to light touch and proprioception in bilateral upper and lower extremities Cerebellar exam normal finger to nose to finger as well as heel to shin in bilateral upper and lower extremities Musculoskeletal: Full range of motion in all 4 extremities. No joint swelling   Assessment/Plan: 1. Functional deficits which require 3+  hours per day of interdisciplinary therapy in a comprehensive inpatient rehab setting. Physiatrist is providing close team supervision and 24 hour management of active medical problems listed below. Physiatrist and rehab team continue to assess barriers to discharge/monitor patient progress toward functional and medical goals  Care Tool:  Bathing    Body parts bathed by patient: Right arm, Left arm, Chest, Abdomen, Front perineal area, Buttocks, Right upper leg, Left upper leg, Right lower leg, Left lower leg, Face         Bathing assist Assist Level: Contact Guard/Touching assist     Upper Body Dressing/Undressing Upper body dressing   What is the patient wearing?: Pull over shirt    Upper body assist Assist Level: Supervision/Verbal cueing    Lower Body Dressing/Undressing Lower body dressing      What is the patient wearing?: Underwear/pull up, Pants     Lower body assist Assist for lower body dressing: Contact Guard/Touching assist     Toileting Toileting    Toileting assist Assist for toileting: Contact Guard/Touching assist     Transfers Chair/bed transfer  Transfers assist     Chair/bed transfer assist level: Contact Guard/Touching assist     Locomotion Ambulation   Ambulation  assist      Assist level: Contact Guard/Touching assist Assistive device: No Device Max distance: 250 ft   Walk 10 feet activity   Assist     Assist level: Contact Guard/Touching assist Assistive device: No Device   Walk 50 feet activity   Assist    Assist level: Contact Guard/Touching assist Assistive device: No Device    Walk 150 feet activity   Assist    Assist level: Contact Guard/Touching assist Assistive device: No Device    Walk 10 feet on uneven surface  activity   Assist     Assist level: Minimal Assistance - Patient > 75% Assistive device: Other (comment) (no device)   Wheelchair     Assist Is the patient using a wheelchair?:  No (Did not use during eval and not expected to use after d/c)   Wheelchair activity did not occur: N/A         Wheelchair 50 feet with 2 turns activity    Assist    Wheelchair 50 feet with 2 turns activity did not occur: N/A       Wheelchair 150 feet activity     Assist  Wheelchair 150 feet activity did not occur: N/A          Blood pressure 125/85, pulse (!) 106, temperature (!) 97.5 F (36.4 C), temperature source Oral, resp. rate 16, height 5' 2 (1.575 m), weight 64.3 kg, SpO2 100%.  Medical Problem List and Plan: 1. Functional deficits secondary to Subacute R MCA stroke             -patient may  shower             -ELOS/Goals: 10-12 days Supervision             -Continue CIR 2.  H/o DVT/Antithrombotics: -DVT/anticoagulation:  Pharmaceutical: Eliquis  5mg  BID             -antiplatelet therapy: N/A 3. Pain Management: tylenol  prn. Topamax  25mg  nightly added for constant HA- wanted to add Elavil since less memory issues, however pt has allergy that is triggered with Pamelor or Elavil-  4. Mood/Behavior/Sleep: LCSW to follow for evaluation and support.              -antipsychotic agents:  N/A  -Trazodone  PRN 5. Neuropsych/cognition: This patient is capable of making decisions on her own behalf. 6. Skin/Wound Care: Routine pressure relief measures.  7. Fluids/Electrolytes/Nutrition: Monitor I/O.  8. T2DM: New diagnosis w/A1C-13.7. Now on insulin , Semglee  15U BID, Novolog  4U TID WC             --monitor BS ac/hs and use SSI for elevated BS -04/02/24 CBGs variable mostly <200 but some higher, will see trend before deciding on meds, Metformin  might be a good option to start too? -04/03/24 CBGs a bit better today, <200 since yesterday; consider metformin , defer to weekday team CBG (last 3)  Recent Labs    04/02/24 2054 04/03/24 0553 04/03/24 1114  GLUCAP 152* 174* 191*    9. New onset seizure: On Keppra  1000mg  bid 10. Elevated lipids: Chol-233, LDL 132,  Trig-425. Now on Lipitor  80 mg daily.  11. H/o Mood disorder Schizaffective d/o and Bipolar written in chart- not clear since hasn't been on meds/BPD/Depression: Not on any medications since her 20's.  --Adjustment issues now--wants something mild maybe as needed. Buspar  5mg  TID PRN added 12. GERD/Bloating: Probiotic added. Consider protonix while here? Weekday team to address 13. H/o Tobacco/Marijuana use: Educate on importance of cessation  14. Infection?? Pt on Doxycycline  100mg  BID starting 04/01/24-- unclear indication, weekday team to clarify   LOS: 3 days A FACE TO FACE EVALUATION WAS PERFORMED  [address] 04/03/2024, 12:05 PM

## 2024-04-03 NOTE — Plan of Care (Signed)
 Problem: Skin Integrity: Goal: Risk for impaired skin integrity will decrease Outcome: Progressing   Problem: Nutritional: Goal: Progress toward achieving an optimal weight will improve Outcome: Progressing   Problem: Nutritional: Goal: Maintenance of adequate nutrition will improve Outcome: Progressing   Problem: Fluid Volume: Goal: Ability to maintain a balanced intake and output will improve Outcome: Progressing

## 2024-04-04 LAB — PROTHROMBIN GENE MUTATION

## 2024-04-04 LAB — GLUCOSE, CAPILLARY
Glucose-Capillary: 157 mg/dL — ABNORMAL HIGH (ref 70–99)
Glucose-Capillary: 107 mg/dL — ABNORMAL HIGH (ref 70–99)
Glucose-Capillary: 194 mg/dL — ABNORMAL HIGH (ref 70–99)
Glucose-Capillary: 127 mg/dL — ABNORMAL HIGH (ref 70–99)

## 2024-04-04 LAB — BASIC METABOLIC PANEL WITH GFR
Anion gap: 8 (ref 5–15)
BUN: 9 mg/dL (ref 6–20)
CO2: 19 mmol/L — ABNORMAL LOW (ref 22–32)
Calcium: 9 mg/dL (ref 8.9–10.3)
Chloride: 111 mmol/L (ref 98–111)
Creatinine, Ser: 0.68 mg/dL (ref 0.44–1.00)
GFR, Estimated: >60 mL/min (ref >60)
Glucose, Bld: 152 mg/dL — ABNORMAL HIGH (ref 70–99)
Potassium: 4 mmol/L (ref 3.5–5.1)
Sodium: 138 mmol/L (ref 135–145)

## 2024-04-04 LAB — CBC
HCT: 39.6 % (ref 36.0–46.0)
Hemoglobin: 13.4 g/dL (ref 12.0–15.0)
MCH: 29.8 pg (ref 26.0–34.0)
MCHC: 33.8 g/dL (ref 30.0–36.0)
MCV: 88 fL (ref 80.0–100.0)
Platelets: 323 K/uL (ref 150–400)
RBC: 4.5 MIL/uL (ref 3.87–5.11)
RDW: 13.2 % (ref 11.5–15.5)
WBC: 8.6 K/uL (ref 4.0–10.5)
nRBC: 0 % (ref 0.0–0.2)

## 2024-04-04 LAB — FACTOR 5 LEIDEN

## 2024-04-04 NOTE — Progress Notes (Signed)
 Occupational Therapy Session Note  Patient Details  Name: Terri [name] MRN: [id number] Date of Birth: Dec 27, 1975  Today's Date: 04/04/2024 OT Individual Time: 8892-8798 OT Individual Time Calculation (min): 54 min    Short Term Goals: Week 1:  OT Short Term Goal 1 (Week 1): STG = LTG due to ELOS  Skilled Therapeutic Interventions/Progress Updates:     Pt received sitting EOB with nursing staff present in room. Pt presenting with flat affect, receptive to skilled OT session reporting 3/10 pain d/t head ache- OT offering intermittent rest breaks, repositioning, and therapeutic support to optimize participation in therapy session. Pt politely declining medications from RN. Focused this session on ADL retraining, and L NMRE, activity tolerance.  ADL:  -Dressing: Pt requesting to change pants at beginning of session. She was able to weave B LEs into pants using hemi-technique EOB and stand to bring pants to waist SUP, no LOB. She donned socks/shoes with set-up A- Pt opting to tuck laces rather than tie them into a bow d/t increased FMC challenge.  -Grooming/hygiene: Pt completed oral care in standing position managing toiletry items without dropping- increased time provided d/t slowed task initiation and slowed FMC on L UE.  Mobility:  -Pt completed functional mobility to/from therapy gym without AD for endurance training and to work on coordination of L foot placement during gait. Pt with slowed cadence and postural sway during functional mobility, however no significant LOB, CGA provided for safety and balance.   Therapeutic Activity:  -Pt participated in seated beach ball volley activity using 3# weighted dowel sitting EOM to work on VMC, B UE reciprocal arm movements, activity tolerance, UE strength, and trunk control. Pt able to tolerate completing 1 minute x1 trials with short rest breaks provided between trials and verbal cues for techniques. Pt noted to have slowed motor movements  on L UE with decreased power compared to R, however noted improvement in coordinating B UE arm movements with verbal cuing.   NMRE:  -Pt tasked with utilizing hand strengthener on level 1 setting with silver spring to pick up Jenga blocks using L UE and transport them to hollow cones. Activity focused on maintained grasp, depth perception, and functional reaching with targeted reach. Pt able to complete 2 trials of 10 reps with MIN dropping. First trial completed in seated position and second completed in standing for increased dual tasking challenge, close SUP provided for safety and no LOB noted.  -Pt completed star stepping activity in standing position no AD to work on lateral weight shifting, coordination, and dynamic balance. Pt tasked with stepping R/L foot towards colored disks spaced around Pt in circle when instructed by OT and then return back to midline. Pt completed 2 trials with CGA provided for balance- no significant LOB and improved stepping strategies noted. Seated rest break provided between and following trials.   Pt was left resting EOB with call bell in reach, bed alarm on, and all needs met.    Therapy Documentation Precautions:  Precautions Precautions: Fall Precaution/Restrictions Comments: L hemiparesis Restrictions Weight Bearing Restrictions Per Provider Order: No   Therapy/Group: Individual Therapy  Terri [name] 04/04/2024, 7:48 AM

## 2024-04-04 NOTE — Plan of Care (Signed)
 Problem: Skin Integrity: Goal: Risk for impaired skin integrity will decrease Outcome: Progressing   Problem: Nutritional: Goal: Progress toward achieving an optimal weight will improve Outcome: Progressing   Problem: Nutritional: Goal: Maintenance of adequate nutrition will improve Outcome: Progressing   Problem: Fluid Volume: Goal: Ability to maintain a balanced intake and output will improve Outcome: Progressing

## 2024-04-04 NOTE — Progress Notes (Signed)
 Physical Therapy Session Note  Patient Details  Name: Terri [name] MRN: [id number] Date of Birth: [birth year]  Today's Date: 04/04/2024 PT Individual Time: 9154-8996 PT Individual Time Calculation (min): 78 min   Short Term Goals: Week 1:  PT Short Term Goal 1 (Week 1): STG = LTG d/t ELOS  Skilled Therapeutic Interventions/Progress Updates:  Patient supine in bed on entrance to room. Patient alert and agreeable to PT session. Initially confused as she did not realize that she had therapy at this time, but agreeable as long as she can take a shower and get dressed for day.   Patient with no pain complaint at start of session.  Therapeutic Activity: Bed Mobility: Pt performed supine <> sit with IND. No vc required.  Transfers: Pt performed sit<>stand and stand pivot transfers throughout session with distance supervision. She collects her clothing for the day and needed items for shower. Is able to place towels on shower seat as well as on floor. Showers with Mod I. Dresses while seated on EOB and is able to complete with Mod I/ supervision. Has difficulty with tights but is able to don with extra time.   Gait Training:  Pt ambulated short, in-home distances within room with distant supervision and no AD. Is also able to ambulate >250 ft x2 with no AD and close supervision. Improved foot clearance from floor with LLE throughout. Few scuffs of L foot with fatigue. No vc as pt able to self-correct. .  Neuromuscular Re-ed: NMR facilitated during session with focus on standing balance, proprioception, motor control. Pt instructed in dynamic step out toward mirror with Squigz placed from overhead height to below knee height. Instructed to step into forward lunge, self-choose target, pull , and then dynamically step back to starting positioning. Demonstrated to pt. Then guided pt in performance. She tends to take multiple steps to mirror requiring vc/ visual cues to correct. Is able to perform  correctly by final 1/3 of bout with difficulty maintaining balance with low reaches using contralateral UE.   NMR performed for improvements in motor control and coordination, balance, sequencing, judgement, and self confidence/ efficacy in performing all aspects of mobility at highest level of independence.   Patient seated on EOB at end of session with brakes locked, bed alarm set, and all needs within reach.   Therapy Documentation Precautions:  Precautions Precautions: Fall Precaution/Restrictions Comments: L hemiparesis Restrictions Weight Bearing Restrictions Per Provider Order: No  Pain:  No pain related this session.   Therapy/Group: Individual Therapy  Mliss DELENA Milliner PT, DPT, CSRS 04/04/2024, 9:06 AM

## 2024-04-04 NOTE — Progress Notes (Signed)
 PROGRESS NOTE   Subjective/Complaints:  No issues overnite, pt did am ADLs mod I  ROS: as per HPI. Denies CP, SOB, abd pain, N/V/D/C, or any other complaints at this time.   +anxiety  Objective:   No results found.  Recent Labs    04/04/24 0534  WBC 8.6  HGB 13.4  HCT 39.6  PLT 323   Recent Labs    04/04/24 0534  NA 138  K 4.0  CL 111  CO2 19*  GLUCOSE 152*  BUN 9  CREATININE 0.68  CALCIUM  9.0    Intake/Output Summary (Last 24 hours) at 04/04/2024 1038 Last data filed at 04/03/2024 1309 Gross per 24 hour  Intake 118 ml  Output --  Net 118 ml         Physical Exam: Vital Signs Blood pressure 97/71, pulse 97, temperature 97.8 F (36.6 C), temperature source Oral, resp. rate 16, height 5' 2 (1.575 m), weight 64.3 kg, SpO2 100%.   General: No acute distress, resting comfortably in bed.  Mood and affect are appropriate, slightly anxious Heart: Regular rate and rhythm no rubs murmurs or extra sounds Lungs: Clear to auscultation, breathing unlabored, no rales or wheezes Abdomen: Positive bowel sounds, soft nontender to palpation, nondistended Extremities: No clubbing, cyanosis, or edema Skin: No evidence of breakdown, no evidence of rash over exposed surfaces.    Neurologic: Cranial nerves II through XII intact, motor strength is 5/5 in Right and 4/5 Left deltoid, bicep, tricep, grip, hip flexor, knee extensors, ankle dorsiflexor and plantar flexor Sensory exam normal sensation to light touch and proprioception in bilateral upper and lower extremities Cerebellar exam normal finger to nose to finger as well as heel to shin in bilateral upper and lower extremities Musculoskeletal: Full range of motion in all 4 extremities. No joint swelling   Assessment/Plan: 1. Functional deficits which require 3+ hours per day of interdisciplinary therapy in a comprehensive inpatient rehab setting. Physiatrist is  providing close team supervision and 24 hour management of active medical problems listed below. Physiatrist and rehab team continue to assess barriers to discharge/monitor patient progress toward functional and medical goals  Care Tool:  Bathing    Body parts bathed by patient: Right arm, Left arm, Chest, Abdomen, Front perineal area, Buttocks, Right upper leg, Left upper leg, Right lower leg, Left lower leg, Face         Bathing assist Assist Level: Contact Guard/Touching assist     Upper Body Dressing/Undressing Upper body dressing   What is the patient wearing?: Pull over shirt    Upper body assist Assist Level: Supervision/Verbal cueing    Lower Body Dressing/Undressing Lower body dressing      What is the patient wearing?: Underwear/pull up, Pants     Lower body assist Assist for lower body dressing: Contact Guard/Touching assist     Toileting Toileting    Toileting assist Assist for toileting: Contact Guard/Touching assist     Transfers Chair/bed transfer  Transfers assist     Chair/bed transfer assist level: Contact Guard/Touching assist     Locomotion Ambulation   Ambulation assist      Assist level: Contact Guard/Touching assist Assistive device: No  Device Max distance: 250 ft   Walk 10 feet activity   Assist     Assist level: Contact Guard/Touching assist Assistive device: No Device   Walk 50 feet activity   Assist    Assist level: Contact Guard/Touching assist Assistive device: No Device    Walk 150 feet activity   Assist    Assist level: Contact Guard/Touching assist Assistive device: No Device    Walk 10 feet on uneven surface  activity   Assist     Assist level: Minimal Assistance - Patient > 75% Assistive device: Other (comment) (no device)   Wheelchair     Assist Is the patient using a wheelchair?: No (Did not use during eval and not expected to use after d/c)   Wheelchair activity did not occur:  N/A         Wheelchair 50 feet with 2 turns activity    Assist    Wheelchair 50 feet with 2 turns activity did not occur: N/A       Wheelchair 150 feet activity     Assist  Wheelchair 150 feet activity did not occur: N/A          Blood pressure 97/71, pulse 97, temperature 97.8 F (36.6 C), temperature source Oral, resp. rate 16, height 5' 2 (1.575 m), weight 64.3 kg, SpO2 100%.  Medical Problem List and Plan: 1. Functional deficits secondary to Subacute R MCA stroke             -patient may  shower             -ELOS/Goals: 10-12 days Supervision             -Continue CIR 2.  H/o DVT/Antithrombotics: -DVT/anticoagulation:  Pharmaceutical: Eliquis  5mg  BID             -antiplatelet therapy: N/A 3. Pain Management: tylenol  prn. Topamax  25mg  nightly added for constant HA- no c/o today  4. Mood/Behavior/Sleep: LCSW to follow for evaluation and support.              -antipsychotic agents:  N/A  -Trazodone  PRN 5. Neuropsych/cognition: This patient is capable of making decisions on her own behalf. 6. Skin/Wound Care: Routine pressure relief measures.  7. Fluids/Electrolytes/Nutrition: Monitor I/O.  8. T2DM: New diagnosis w/A1C-13.7. Now on insulin , Semglee  15U BID, Novolog  4U TID WC             --monitor BS ac/hs and use SSI for elevated BS -04/02/24 CBGs variable mostly <200 but some higher, will see trend before deciding on meds, Metformin  might be a good option to start too? -04/03/24 CBGs a bit better today, <200 since yesterday; consider metformin , defer to weekday team CBG (last 3)  Recent Labs    04/03/24 1654 04/03/24 2038 04/04/24 0538  GLUCAP 175* 200* 157*    9. New onset seizure: On Keppra  1000mg  bid 10. Elevated lipids: Chol-233, LDL 132, Trig-425. Now on Lipitor  80 mg daily.  11. H/o Mood disorder Schizaffective d/o and Bipolar written in chart- not clear since hasn't been on meds/BPD/Depression: Not on any medications since her 20's.  --Adjustment  issues now--wants something mild maybe as needed. Buspar  5mg  TID PRN added 12. GERD/Bloating: Probiotic added. Consider protonix while here? Weekday team to address 13. H/o Tobacco/Marijuana use: Educate on importance of cessation 14. Infection?? Pt on Doxycycline  100mg  BID starting 04/01/24-- unclear indication, weekday team to clarify   LOS: 4 days A FACE TO FACE EVALUATION WAS PERFORMED  Prentice FORBES Compton 04/04/2024,  10:38 AM

## 2024-04-04 NOTE — Progress Notes (Signed)
 Speech Language Pathology Daily Session Note  Patient Details  Name: Terri [name] MRN: [id number] Date of Birth: [birth year]  Today's Date: 04/04/2024 SLP Individual Time: 1330-1430 SLP Individual Time Calculation (min): 60 min  Short Term Goals: Week 1: SLP Short Term Goal 1 (Week 1): Patient will solve mildly complex problems during functional iADL-related tasks given supervision cues. SLP Short Term Goal 2 (Week 1): Patient will state cognitive deficits and how they impact performance on functional iADL tasks given supervision cues. SLP Short Term Goal 3 (Week 1): Patient will sustain attention to functional therapy tasks for 20 minutes given min cues.  Skilled Therapeutic Interventions: Skilled therapy session focused on cognitive goals. SLP introduced self and role. Patient independently shared biographical information including hometown, prior job and reason for admission. SLP facilitated completion of medication management task to encourage use of problem solving, memory and organizational skills. SLP  provided supervisionA for patient to complete BID pillbox according to written and verbalized directions. Patient with x2 mistakes, though able to self correct with supervision cues. SLP educated patient on continuing to utilize pillbox upon d/c to aid in planning, organization, and memory. Patient verbalized understanding. SLP continued to challenge patient through identification of medication mistakes task. Patient required supervision-minA to identify errors in BID pillbox and correct according to directions. Patient left in bed with alarm set and call bell in reach. Continue POC.   Pain None reported   Therapy/Group: Individual Therapy  Karey Suthers M.A., CCC-SLP 04/04/2024, 7:48 AM

## 2024-04-05 LAB — GLUCOSE, CAPILLARY
Glucose-Capillary: 129 mg/dL — ABNORMAL HIGH (ref 70–99)
Glucose-Capillary: 108 mg/dL — ABNORMAL HIGH (ref 70–99)
Glucose-Capillary: 153 mg/dL — ABNORMAL HIGH (ref 70–99)
Glucose-Capillary: 107 mg/dL — ABNORMAL HIGH (ref 70–99)

## 2024-04-05 NOTE — Plan of Care (Signed)
 Problem: Skin Integrity: Goal: Risk for impaired skin integrity will decrease Outcome: Progressing   Problem: Nutritional: Goal: Progress toward achieving an optimal weight will improve Outcome: Progressing   Problem: Nutritional: Goal: Maintenance of adequate nutrition will improve Outcome: Progressing   Problem: Fluid Volume: Goal: Ability to maintain a balanced intake and output will improve Outcome: Progressing

## 2024-04-05 NOTE — Progress Notes (Signed)
 Occupational Therapy Session Note  Patient Details  Name: Terri [name] MRN: [id number] Date of Birth: 02/15/1976  Today's Date: 04/05/2024 OT Individual Time: 1120-1200 OT Individual Time Calculation (min): 40 min    Short Term Goals: Week 1:  OT Short Term Goal 1 (Week 1): STG = LTG due to ELOS  Skilled Therapeutic Interventions/Progress Updates:     Pt received resting in bed presenting with flat affect, however to be in good spirits receptive to skilled OT session reporting 3/10 pain d/t mild head ache and stomach ache d/t eating hospital food- OT offering intermittent rest breaks, repositioning, and therapeutic support to optimize participation in therapy session. Pt requesting to take shower this AM and complete her morning routine. Focused this session on ADL retraining with emphasis on safety awareness, activity tolerance, and L UE functional use. Pt completed ambulatory transfers during session no AD with close SUP provided for safety- verbal cues required when side stepping into shower. Doffed clothing in standing position while using grab bars for balance SUP. Majority of U/LB bathing completed in seated position for energy conservation, standing to wash peri-areas while holding onto grab bar with SUP no LOB noted. Pt was able to utilize L UE at a diminished level during shower with min verbal cues required. Following shower, Pt able to dry self in seated position with distant SUP and complete U/LB dressing seated EOB donning [place] shirt mod I and underwear/pants with distant SUP. Improved safety awareness noted this session. Pt was left resting in bed with call bell in reach, bed alarm on, and all needs met.    Therapy Documentation Precautions:  Precautions Precautions: Fall Precaution/Restrictions Comments: L hemiparesis Restrictions Weight Bearing Restrictions Per Provider Order: No   Therapy/Group: Individual Therapy  [name] 04/05/2024, 7:57 AM

## 2024-04-05 NOTE — Progress Notes (Signed)
 Physical Therapy Session Note  Patient Details  Name: Terri [name] MRN: [id number] Date of Birth: [birth year]  Today's Date: 04/05/2024 PT Individual Time: 8472-8384 PT Individual Time Calculation (min): 48 min   Short Term Goals: Week 1:  PT Short Term Goal 1 (Week 1): STG = LTG d/t ELOS  Skilled Therapeutic Interventions/Progress Updates:  Patient supine in bed on entrance to room. Patient alert and agreeable to PT session.   Patient with no pain complaint at start of session.  Is wearing paper scrubs from unit and pt related she did not know when family would come to take clothes or bring new ones. Related that at home she would wash her own clothes. Related that there is a washer/ dryer on unit and pt could wash her own clothes if she wanted.   Pt IND to get OOB. Ambulates slowly about room collecting clothes in dirty clothes bag with no AD and Mod I. No safety issues. Ambulates >100 ft to laundry and is able to put clothes in washer, add detergent and then requires min vc for button to start wash.   Ambulates to main gym with slow but steady pace and Mod I/ supervision. Guided pt in NMR for dynamic balance training. With HHA is able to grapevine step to L/ R for 15' in each direction. Then guided in stepping over five 6 hurdles forward with alternating steps and one foot placement between hurdles x2. Then steps laterally over hurdles requiring instruction and demonstration prior to initiation for wide stepping in order to leave room for trailing LE. Requires seated rest break between for fatigue.   Ambulates back to room with supervision d/t fatigue and demos low but adequate step height. VC for maintaining focus to L step quality to maintain safety.   Patient supine at end of session with brakes locked, bed alarm set, and all needs within reach.   Therapy Documentation Precautions:  Precautions Precautions: Fall Precaution/Restrictions Comments: L  hemiparesis Restrictions Weight Bearing Restrictions Per Provider Order: No  Pain:  No pain related this session.  Therapy/Group: Individual Therapy  Mliss DELENA Milliner PT, DPT, CSRS 04/05/2024, 6:59 PM

## 2024-04-05 NOTE — Progress Notes (Signed)
 Speech Language Pathology Daily Session Note  Patient Details  Name: Terri [name] MRN: [id number] Date of Birth: 06-01-1976  Today's Date: 04/05/2024 SLP Individual Time: 9081-8988 and 8650-8572 SLP Individual Time Calculation (min): 53 min And 38 min  Short Term Goals: Week 1: SLP Short Term Goal 1 (Week 1): Patient will solve mildly complex problems during functional iADL-related tasks given supervision cues. SLP Short Term Goal 2 (Week 1): Patient will state cognitive deficits and how they impact performance on functional iADL tasks given supervision cues. SLP Short Term Goal 3 (Week 1): Patient will sustain attention to functional therapy tasks for 20 minutes given min cues.  Skilled Therapeutic Interventions:   Session 1: SLP conducted skilled therapy session targeting cognitive goals. Patient endorses that she feels her thinking skills are approximately 95% back to baseline but continues to endorse changes in her memory. SLP facilitated attention to detail task at beginning of session, which patient completed thoroughly and accurately with modI and demonstrated independent self-correction of mistakes. Patient then completed mildly complex processing and sequencing task with supervision assist overall for suggested strategy use and increased processing time. SLP and patient discussed the concept of cognitive endurance, especially after stroke, and patient does endorse that she is limited by endurance during prolonged thinking efforts, especially those that involve vision (I.e. written tasks, etc.) In final minutes of session, patient reported that her eyes were getting tired, thus conducted verbally based tasks. Of note, patient's sustained attention to verbal tasks requires increased cuing compared to functional written tasks. Patient required frequent prompt repetition and min to mod cues for working memory during mildly complex verbally presented problem solving tasks. Patient was left  in room with call bell in reach and alarm set. SLP will continue to target goals per plan of care.     Session 2: SLP conducted skilled therapy session targeting cognitive goals. SLP facilitated novel card game requiring working memory, sustained attention, switching cognitive set, and sequencing/matching. Patient utilized all skills with supervision fading quickly to modI. Patient was left in room with call bell in reach and alarm set. SLP will continue to target goals per plan of care.        Pain Pain Assessment Pain Scale: 0-10 Pain Score: 8  Pain Location: Head  Therapy/Group: Individual Therapy  Rosina Downy, M.A., CCC-SLP  Katrin Grabel A Elizabeth Haff 04/05/2024, 11:01 AM

## 2024-04-05 NOTE — Progress Notes (Incomplete)
 Physical Therapy Session Note  Patient Details  Name: Terri [name] MRN: [id number] Date of Birth: 1976/01/28  Today's Date: 04/05/2024 PT Individual Time: 8472-8384 PT Individual Time Calculation (min): 48 min   Short Term Goals: Week 1:  PT Short Term Goal 1 (Week 1): STG = LTG d/t ELOS  Skilled Therapeutic Interventions/Progress Updates:  Patient *** on entrance to room. Patient alert and agreeable to PT session.   Patient with no pain complaint at start of session.  Therapeutic Activity: Bed Mobility: Pt performed supine <> sit with ***. VC/ tc required for ***. Transfers: Pt performed sit<>stand and stand pivot transfers throughout session with ***. Provided vc/ tc for***.  Gait Training:  Pt ambulated *** ft using *** with ***. Demonstrated ***. Provided vc/ tc for ***.  Wheelchair Mobility:  Pt propelled wheelchair *** feet with ***. Provided vc/ tc for ***.  Neuromuscular Re-ed: NMR facilitated during session with focus on ***. Pt guided in ***. NMR performed for improvements in motor control and coordination, balance, sequencing, judgement, and self confidence/ efficacy in performing all aspects of mobility at highest level of independence.   Therapeutic Exercise: Pt performed the following exercises with vc/ tc for proper technique. ***  Patient *** at end of session with brakes locked, *** alarm set, and all needs within reach.   Therapy Documentation Precautions:  Precautions Precautions: Fall Precaution/Restrictions Comments: L hemiparesis Restrictions Weight Bearing Restrictions Per Provider Order: No General:   Vital Signs:   Pain:       Therapy/Group: Individual Therapy  Mliss DELENA Milliner PT, DPT, CSRS 04/05/2024, 6:59 PM

## 2024-04-05 NOTE — Progress Notes (Signed)
 PROGRESS NOTE   Subjective/Complaints:  Patient in bed today no issues overnight.  ROS: as per HPI. Denies CP, SOB, abd pain, N/V/D/C, or any other complaints at this time.   +anxiety  Objective:   No results found.  Recent Labs    04/04/24 0534  WBC 8.6  HGB 13.4  HCT 39.6  PLT 323   Recent Labs    04/04/24 0534  NA 138  K 4.0  CL 111  CO2 19*  GLUCOSE 152*  BUN 9  CREATININE 0.68  CALCIUM  9.0    Intake/Output Summary (Last 24 hours) at 04/05/2024 0858 Last data filed at 04/05/2024 0800 Gross per 24 hour  Intake 354 ml  Output --  Net 354 ml         Physical Exam: Vital Signs Blood pressure 107/78, pulse 92, temperature 97.8 F (36.6 C), temperature source Oral, resp. rate 20, height 5' 2 (1.575 m), weight 64.3 kg, SpO2 100%.   General: No acute distress, resting comfortably in bed.  Mood and affect are appropriate, slightly anxious Heart: Regular rate and rhythm no rubs murmurs or extra sounds Lungs: Clear to auscultation, breathing unlabored, no rales or wheezes Abdomen: Positive bowel sounds, soft nontender to palpation, nondistended Extremities: No clubbing, cyanosis, or edema Skin: No evidence of breakdown, no evidence of rash over exposed surfaces.    Neurologic: Cranial nerves II through XII intact, motor strength is 5/5 in Right and 4/5 Left deltoid, bicep, tricep, grip, hip flexor, knee extensors, ankle dorsiflexor and plantar flexor Sensory exam normal sensation to light touch and proprioception in bilateral upper and lower extremities Cerebellar exam normal finger to nose to finger right side mild dysmetria left side Musculoskeletal: Full range of motion in all 4 extremities. No joint swelling   Assessment/Plan: 1. Functional deficits which require 3+ hours per day of interdisciplinary therapy in a comprehensive inpatient rehab setting. Physiatrist is providing close team  supervision and 24 hour management of active medical problems listed below. Physiatrist and rehab team continue to assess barriers to discharge/monitor patient progress toward functional and medical goals  Care Tool:  Bathing    Body parts bathed by patient: Right arm, Left arm, Chest, Abdomen, Front perineal area, Buttocks, Right upper leg, Left upper leg, Right lower leg, Left lower leg, Face         Bathing assist Assist Level: Contact Guard/Touching assist     Upper Body Dressing/Undressing Upper body dressing   What is the patient wearing?: Pull over shirt    Upper body assist Assist Level: Supervision/Verbal cueing    Lower Body Dressing/Undressing Lower body dressing      What is the patient wearing?: Underwear/pull up, Pants     Lower body assist Assist for lower body dressing: Contact Guard/Touching assist     Toileting Toileting    Toileting assist Assist for toileting: Contact Guard/Touching assist     Transfers Chair/bed transfer  Transfers assist     Chair/bed transfer assist level: Contact Guard/Touching assist     Locomotion Ambulation   Ambulation assist      Assist level: Contact Guard/Touching assist Assistive device: No Device Max distance: 250 ft   Walk  10 feet activity   Assist     Assist level: Contact Guard/Touching assist Assistive device: No Device   Walk 50 feet activity   Assist    Assist level: Contact Guard/Touching assist Assistive device: No Device    Walk 150 feet activity   Assist    Assist level: Contact Guard/Touching assist Assistive device: No Device    Walk 10 feet on uneven surface  activity   Assist     Assist level: Minimal Assistance - Patient > 75% Assistive device: Other (comment) (no device)   Wheelchair     Assist Is the patient using a wheelchair?: No (Did not use during eval and not expected to use after d/c)   Wheelchair activity did not occur: N/A          Wheelchair 50 feet with 2 turns activity    Assist    Wheelchair 50 feet with 2 turns activity did not occur: N/A       Wheelchair 150 feet activity     Assist  Wheelchair 150 feet activity did not occur: N/A          Blood pressure 107/78, pulse 92, temperature 97.8 F (36.6 C), temperature source Oral, resp. rate 20, height 5' 2 (1.575 m), weight 64.3 kg, SpO2 100%.  Medical Problem List and Plan: 1. Functional deficits secondary to Subacute R MCA stroke             -patient may  shower             -ELOS/Goals: 10-12 days Supervision             -Continue CIR- team conf in am  2.  H/o DVT/Antithrombotics: -DVT/anticoagulation:  Pharmaceutical: Eliquis  5mg  BID             -antiplatelet therapy: N/A 3. Pain Management: tylenol  prn. Topamax  25mg  nightly added for constant HA- no c/o today  4. Mood/Behavior/Sleep: LCSW to follow for evaluation and support.              -antipsychotic agents:  N/A  -Trazodone  PRN 5. Neuropsych/cognition: This patient is capable of making decisions on her own behalf. 6. Skin/Wound Care: Routine pressure relief measures.  7. Fluids/Electrolytes/Nutrition: Monitor I/O.  8. T2DM: New diagnosis w/A1C-13.7. Now on insulin , Semglee  15U BID, Novolog  4U TID WC             --monitor BS ac/hs and use SSI for elevated BS -04/02/24 CBGs variable mostly <200 but some higher, will see trend before deciding on meds, Metformin  might be a good option to start too? -04/03/24 CBGs a bit better today, <200 since yesterday; consider metformin , defer to weekday team CBG (last 3)  Recent Labs    04/04/24 1644 04/04/24 2128 04/05/24 0622  GLUCAP 194* 127* 129*  Controlled 04/05/2024 9. New onset seizure: On Keppra  1000mg  bid 10. Elevated lipids: Chol-233, LDL 132, Trig-425. Now on Lipitor  80 mg daily.  11. H/o Mood disorder Schizaffective d/o and Bipolar written in chart- not clear since hasn't been on meds/BPD/Depression: Not on any medications since  her 20's.  --Adjustment issues now--wants something mild maybe as needed. Buspar  5mg  TID PRN added 12. GERD/Bloating: Probiotic added. Consider protonix while here? Weekday team to address 13. H/o Tobacco/Marijuana use: Educate on importance of cessation 14. Infection?? Pt on Doxycycline  100mg  BID starting 04/01/24-- unclear indication, weekday team to clarify   LOS: 5 days A FACE TO FACE EVALUATION WAS PERFORMED  Prentice FORBES Compton 04/05/2024, 8:58 AM

## 2024-04-06 ENCOUNTER — Other Ambulatory Visit (HOSPITAL_COMMUNITY): Payer: Self-pay

## 2024-04-06 DIAGNOSIS — F3160 Bipolar disorder, current episode mixed, unspecified: Secondary | ICD-10-CM

## 2024-04-06 LAB — GLUCOSE, CAPILLARY
Glucose-Capillary: 163 mg/dL — ABNORMAL HIGH (ref 70–99)
Glucose-Capillary: 149 mg/dL — ABNORMAL HIGH (ref 70–99)
Glucose-Capillary: 169 mg/dL — ABNORMAL HIGH (ref 70–99)
Glucose-Capillary: 136 mg/dL — ABNORMAL HIGH (ref 70–99)

## 2024-04-06 MED ORDER — BUSPIRONE HCL 5 MG PO TABS
5.0000 mg | ORAL_TABLET | Freq: Three times a day (TID) | ORAL | 0 refills | Status: DC | PRN
Start: 2024-04-06 — End: 2024-04-29
  Filled 2024-04-06 (×2): qty 30, 10d supply, fill #0

## 2024-04-06 MED ORDER — TOPIRAMATE 25 MG PO TABS
25.0000 mg | ORAL_TABLET | Freq: Every day | ORAL | 0 refills | Status: DC
  Filled 2024-04-06 (×2): qty 30, 30d supply, fill #0

## 2024-04-06 MED ORDER — ATORVASTATIN CALCIUM 80 MG PO TABS: 80.0000 mg | ORAL_TABLET | Freq: Every day | ORAL | 0 refills | Status: DC

## 2024-04-06 MED ORDER — APIXABAN 5 MG PO TABS
5.0000 mg | ORAL_TABLET | Freq: Two times a day (BID) | ORAL | 0 refills | Status: DC
  Filled 2024-04-06 (×2): qty 60, 30d supply, fill #0

## 2024-04-06 MED ORDER — DOXYCYCLINE HYCLATE 100 MG PO TABS
100.0000 mg | ORAL_TABLET | Freq: Two times a day (BID) | ORAL | 0 refills | Status: DC
Start: 2024-04-06 — End: 2024-05-20
  Filled 2024-04-06 (×2): qty 3, 2d supply, fill #0

## 2024-04-06 MED ORDER — SACCHAROMYCES BOULARDII 250 MG PO CAPS
250.0000 mg | ORAL_CAPSULE | Freq: Two times a day (BID) | ORAL | 0 refills | Status: DC
  Filled 2024-04-06 (×2): qty 60, 30d supply, fill #0

## 2024-04-06 MED ORDER — INSULIN GLARGINE-YFGN 100 UNIT/ML ~~LOC~~ SOLN
18.0000 [IU] | Freq: Two times a day (BID) | SUBCUTANEOUS | Status: DC
  Administered 2024-04-06 – 2024-04-07 (×3): 18 [IU] via SUBCUTANEOUS
  Filled 2024-04-06 (×3): qty 0.18

## 2024-04-06 MED ORDER — LEVETIRACETAM 1000 MG PO TABS
1000.0000 mg | ORAL_TABLET | Freq: Two times a day (BID) | ORAL | 0 refills | Status: DC
  Filled 2024-04-06 (×2): qty 60, 30d supply, fill #0

## 2024-04-06 MED ORDER — LANTUS SOLOSTAR 100 UNIT/ML ~~LOC~~ SOPN
18.0000 [IU] | PEN_INJECTOR | Freq: Two times a day (BID) | SUBCUTANEOUS | 0 refills | Status: DC
  Filled 2024-04-06: qty 12, 30d supply, fill #0
  Filled 2024-04-06: qty 10, 28d supply, fill #0

## 2024-04-06 MED ORDER — INSULIN PEN NEEDLE 32G X 4 MM MISC
1.0000 | Freq: Two times a day (BID) | 0 refills | Status: DC
  Filled 2024-04-06: qty 100, 30d supply, fill #0
  Filled 2024-04-06: qty 100, fill #0

## 2024-04-06 NOTE — H&P (Incomplete)
 This is a right-handed older woman with a history of blood clots (DVT), bipolar disorder, depression, migraines, dizziness, and recent weakness lasting about a week. She was admitted to the hospital on August 4th, 2025, after a seizure-like episode, confusion, and weakness.  Doctors found that she had no movement on the left side of her body (flaccid left hemiplegia). A brain CT showed blood clots in both carotid arteries (major neck arteries) and signs of bleeding in the right front part of her brain. She was started on seizure medication (Keppra ) and given a blood thinner (IV heparin ) because the benefit outweighed the bleeding risk. Her blood sugar was also carefully managed.  Scans of her abdomen, pelvis, and legs showed a blocked artery in her left leg, hardened arteries in her abdomen, some lung changes (possibly due to fluid or collapsed areas), and past colon surgery with surgical sutures in her pelvis. A chest scan showed no lung clots but some hazy areas in the lower right lung.  Her heart ultrasound (echo) didn't show a clot, but another heart test (TEE) found an abnormal connection between her heart and lungs, called an intrapulmonary shunt. Her heart's pumping function was normal.  A brain MRI on August 5th showed multiple small strokes in the right side of her brain with some bleeding and older brain damage. Blood tests for autoimmune and clotting issues came back negative. Her brain wave test (EEG) didn't show any seizures.  She was switched from heparin  to a blood thinner called Eliquis . Doctors plan to repeat the brain scan in 2-3 months to see if the clot is gone and may switch her to aspirin if it is. She still has weakness and numbness on her left side and needs reminders for tasks that require higher-level thinking.  Physical and occupational therapy are helping her. She needs a little help with daily activities and close supervision when moving. Before this, she was independent.  She's now recommended for rehab due to how much function she's lost.  She reports having a daily mild headache (2 out of 10) which improves with Tylenol , though it's usually worse. She last had a bowel movement 2 days ago. She can get out of bed to use the bathroom. She sometimes has vague stomach pain that doesn't seem linked to food. She's sleeping well but feels mentally foggy and not like herself since the stroke.
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
## 2024-04-06 NOTE — Progress Notes (Signed)
 Occupational Therapy Discharge Summary  Patient Details  Name: Terri [name] MRN: [id number] Date of Birth: 03-Sep-1975  Date of Discharge from OT service:April 06, 2024  Today's Date: 04/06/2024 OT Individual Time: 1425-1530 OT Individual Time Calculation (min): 65 min  and Today's Date: 04/06/2024 OT Missed Time: 10 Minutes Missed Time Reason: Other (comment) (delay in care)   Patient has met 6 of 6 long term goals due to improved activity tolerance, improved balance, postural control, ability to compensate for deficits, functional use of  LEFT upper and LEFT lower extremity, improved attention, improved awareness, and improved coordination.  Patient to discharge at overall Modified Independent level.  Patient's care partner is independent to provide the necessary physical and cognitive assistance at discharge.    Reasons goals not met: All goals met  Recommendation:  Patient will benefit from ongoing skilled OT services in outpatient setting to continue to advance functional skills in the area of BADL, iADL, Vocation, and Reduce care partner burden.  Equipment: TTB  Reasons for discharge: treatment goals met and discharge from hospital  Patient/family agrees with progress made and goals achieved: Yes  OT Discharge Skilled Therapeutic Interventions/Progress Updates:  Pt received sitting EOB presenting with flat affect, however to be in good spirits receptive to skilled OT session reporting 0/10 pain- OT offering intermittent rest breaks, repositioning, and therapeutic support to optimize participation in therapy session. Pt requesting to take shower at beginning of session. Engaged Pt in completing functional mobility within her room to gather clothing items and towels in preparation for shower with Pt able to complete MOD I without LOB +increased time d/t slowed gait speed. She was able to complete ambulatory transfers to toilet and shower without AD MOD I. 3/3 toileting tasks  completed MOD I on standard toilet following continent void- documented in flowsheets. She completed majority of U/LB bathing tasks while seated on TTB for energy conservation and to increase safety standing only to wash buttocks. Following shower, U/LB dressing completed sitting EOB MOD I. Pt then completed functional mobility to ADL apartment no AD MOD I. Provided education on simple home modification to increase Pt's safety, fall prevention techniques, and simple home modifications to increase accessibility with Pt receptive to education. Engaged Pt in completing simulated light meal preparation and home management tasks preparing familiar meal of spaghetti. She was able to complete task with SUP, min verbal cues provided for safety awareness during meal preparation activity. Recommending Pt have SUP at d/c for higher level IADLs to ensure safety. Pt reporting she feels prepared for d/c and she has no other questions as this time. Pt was left resting in bed with call bell in reach and all needs met.   Precautions/Restrictions  Precautions Precautions: Fall Restrictions Weight Bearing Restrictions Per Provider Order: No Pain Pain Assessment Pain Scale: 0-10 Pain Score: 0-No pain Pain Location: Head ADL ADL Equipment Provided: Reacher Eating: Independent Where Assessed-Eating: Edge of bed Grooming: Independent Where Assessed-Grooming: Standing at sink Upper Body Bathing: Modified independent Where Assessed-Upper Body Bathing: Shower Lower Body Bathing: Modified independent Where Assessed-Lower Body Bathing: Shower Upper Body Dressing: Independent Where Assessed-Upper Body Dressing: Edge of bed Lower Body Dressing: Independent Where Assessed-Lower Body Dressing: Edge of bed Toileting: Independent Where Assessed-Toileting: Teacher, adult education: Community education officer Method: Proofreader: Chiropractor Transfer: Designer, industrial/product  Method: Ship broker: Biochemist, clinical Transfer: Psychologist, counselling Method: Designer, industrial/product: Grab bars, Emergency planning/management officer Vision Baseline Vision/History:  1 Wears glasses (readers) Patient Visual Report: Eye fatigue/eye pain/headache Vision Assessment?: Yes Eye Alignment: Within Functional Limits Ocular Range of Motion: Within Functional Limits Alignment/Gaze Preference: Within Defined Limits Tracking/Visual Pursuits: Able to track stimulus in all quads without difficulty Saccades: Within functional limits Convergence: Within functional limits Visual Fields: Left visual field deficit (L inferior visual field deficit) Perception  Perception: Impaired Perception-Other Comments: very mild spatial orientation deficit, improvement from inital eval Praxis Praxis: Impaired Praxis Impairment Details: Motor planning Praxis-Other Comments: Mild in L UE Cognition Cognition Overall Cognitive Status: Impaired/Different from baseline Arousal/Alertness: Awake/alert Orientation Level: Person;Place;Situation Person: Oriented Place: Oriented Situation: Oriented Memory: Appears intact Focused Attention: Appears intact Sustained Attention: Appears intact Awareness: Impaired Awareness Impairment: Anticipatory impairment (requires verbal cues for safety during high level IADLs) Problem Solving: Impaired Problem Solving Impairment: Verbal complex;Functional complex Safety/Judgment: Appears intact (intact for ADLs) Brief Interview for Mental Status (BIMS) Repetition of Three Words (First Attempt): 3 Temporal Orientation: Year: Correct Temporal Orientation: Month: Accurate within 5 days Temporal Orientation: Day: Correct Recall: Sock: Yes, no cue required Recall: Blue: Yes, no cue required Recall: Bed: Yes, no cue required BIMS Summary Score: 15 Sensation Sensation Light Touch: Impaired by gross  assessment Hot/Cold: Appears Intact Proprioception: Appears Intact Additional Comments: Reports dullness of sensation on L hemi body but WFL with assessment Coordination Gross Motor Movements are Fluid and Coordinated: No Fine Motor Movements are Fluid and Coordinated: No Coordination and Movement Description: Mild coordination deficit in L hemibody Finger Nose Finger Test: LUE impaired, slow with mild dysmetria; improvement from intial eval Motor  Motor Motor - Skilled Clinical Observations: mild L hemiparesis, improved from inital eval Mobility  Bed Mobility Bed Mobility: Rolling Right;Rolling Left;Left Sidelying to Sit Rolling Right: Independent Rolling Left: Independent Left Sidelying to Sit: Independent Transfers Sit to Stand: Independent Stand to Sit: Independent  Trunk/Postural Assessment  Cervical Assessment Cervical Assessment: Within Functional Limits Thoracic Assessment Thoracic Assessment: Within Functional Limits Lumbar Assessment Lumbar Assessment: Within Functional Limits Postural Control Postural Control: Deficits on evaluation Righting Reactions: delayed, improved from eval Protective Responses: delayed, improved from eval  Balance Balance Balance Assessed: Yes Static Sitting Balance Static Sitting - Balance Support: No upper extremity supported;Feet supported Static Sitting - Level of Assistance: 7: Independent Dynamic Sitting Balance Dynamic Sitting - Balance Support: No upper extremity supported;Feet supported Dynamic Sitting - Level of Assistance: 7: Independent Static Standing Balance Static Standing - Balance Support: During functional activity Static Standing - Level of Assistance: 7: Independent Dynamic Standing Balance Dynamic Standing - Balance Support: No upper extremity supported;During functional activity Dynamic Standing - Level of Assistance: 7: Independent Extremity/Trunk Assessment RUE Assessment RUE Assessment: Within Functional  Limits LUE Assessment LUE Assessment: Exceptions to [facility] General Strength Comments: 4-/5 grossly, mild FMC deficit with slowed motor movements   [name] 04/06/2024, 2:53 PM

## 2024-04-06 NOTE — Plan of Care (Signed)
  Problem: RH Bathing Goal: LTG Patient will bathe all body parts with assist levels (OT) Description: LTG: Patient will bathe all body parts with assist levels (OT) Outcome: Completed/Met   Problem: RH Dressing Goal: LTG Patient will perform lower body dressing w/assist (OT) Description: LTG: Patient will perform lower body dressing with assist, with/without cues in positioning using equipment (OT) Outcome: Completed/Met   Problem: RH Toileting Goal: LTG Patient will perform toileting task (3/3 steps) with assistance level (OT) Description: LTG: Patient will perform toileting task (3/3 steps) with assistance level (OT)  Outcome: Completed/Met   Problem: RH Simple Meal Prep Goal: LTG Patient will perform simple meal prep w/assist (OT) Description: LTG: Patient will perform simple meal prep with assistance, with/without cues (OT). Outcome: Completed/Met   Problem: RH Toilet Transfers Goal: LTG Patient will perform toilet transfers w/assist (OT) Description: LTG: Patient will perform toilet transfers with assist, with/without cues using equipment (OT) Outcome: Completed/Met   Problem: RH Tub/Shower Transfers Goal: LTG Patient will perform tub/shower transfers w/assist (OT) Description: LTG: Patient will perform tub/shower transfers with assist, with/without cues using equipment (OT) Outcome: Completed/Met   Problem: RH Furniture Transfers Goal: LTG Patient will perform furniture transfers w/assist (OT/PT) Description: LTG: Patient will perform furniture transfers  with assistance (OT/PT). Outcome: Completed/Met

## 2024-04-06 NOTE — Plan of Care (Signed)
  Problem: RH Problem Solving Goal: LTG Patient will demonstrate problem solving for (SLP) Description: LTG:  Patient will demonstrate problem solving for basic/complex daily situations with cues  (SLP) Outcome: Completed/Met   Problem: RH Attention Goal: LTG Patient will demonstrate this level of attention during functional activites (SLP) Description: LTG:  Patient will will demonstrate this level of attention during functional activites (SLP) Outcome: Completed/Met   Problem: RH Awareness Goal: LTG: Patient will demonstrate awareness during functional activites type of (SLP) Description: LTG: Patient will demonstrate awareness during functional activites type of (SLP) Outcome: Completed/Met

## 2024-04-06 NOTE — Patient Care Conference (Signed)
 Inpatient RehabilitationTeam Conference and Plan of Care Update Date: 04/06/2024   Time: 10:37 AM    Patient Name: Terri [name]      Medical Record Number: [id number]  Date of Birth: [birth year] Sex: Female         Room/Bed: 4W22C/4W22C-01 Payor Info: Payor: /    Admit Date/Time:  03/31/2024  3:17 PM  Primary Diagnosis:  Acute ischemic right MCA stroke [facility])  Hospital Problems: Principal Problem:   Acute ischemic right MCA stroke [facility])    Expected Discharge Date: Expected Discharge Date: 04/07/24  Team Members Present: Physician leading conference: Dr. Prentice Compton Social Worker Present: Rhoda Clement, LCSW Nurse Present: Barnie Ronde, RN PT Present: Recardo Milliner, PT OT Present: Katheryn Mines, OT SLP Present: Blaise Alderman, SLP PPS Coordinator present : Eleanor Colon, SLP     Current Status/Progress Goal Weekly Team Focus  Bowel/Bladder   Pt continent x2   pt will remain cont x2   Scheduled toileting q 4 hours while awake and prn    Swallow/Nutrition/ Hydration               ADL's   very close to MOD I overall, she is slow to get moving in the morning, some mild decreased insight into deficits noted // Barriers: decreased family support (family has not been present for therapy sessions), decreased insight   MOD I   ADL retraining, dynamic balance, safety awareness, L side attention, L NMRE    Mobility   Bed mobility = IND, Transfers = Mod I, Ambulation = Mod I for time as pace is decreased from age-related norms, with and without SPC   overall ModI/ supervision  Pt education, safety awareness, d/c planning    Communication                Safety/Cognition/ Behavioral Observations  supervision to light min assist for mildly complex problem solving, processing, attention - patient feels she is 95% back to baseline but endorses some ongoing processing deficits   supervision to modI   mildly complex problem solving, processing speed, attention     Pain   pt complains of headache that is relieved with scheduled topamax    pain score < 3   Pain assessment q shift and prn    Skin   Skin intact   skin will remain intact  Routine skin assessments q shift and prn      Discharge Planning:  Home with mom who is retired and can assist if needed. Pt hopes to be mod/i and not bother her Mom. Awaiting DME and limited follow up due to uninsured. Set up to go to [facility] for PCP. Medicaid pending   Team Discussion: Patient admitted post right MCA CVA and new diagnosis of DM.  Patient on target to meet rehab goals: yes, on target to meet goals. Currently needs supervision - mod I for self care and mobility with close supervision for steps.   *See Care Plan and progress notes for long and short-term goals.   Revisions to Treatment Plan:  N/a   Teaching Needs: Safety, medications including insulin  administration, dietary modification, transfers, toileting, etc.   Current Barriers to Discharge: Decreased caregiver support and Home enviroment access/layout and lack of insurance for follow up services.  Possible Resolutions to Barriers: Family education MATCH medication program application completed DME: SPC, TTB     Medical Summary Current Status: BP controlled , CBG management,  Barriers to Discharge: Medical stability   Possible Resolutions  to Barriers/Weekly Focus: cont diabetic management, monitor for seizure activity   Continued Need for Acute Rehabilitation Level of Care: The patient requires daily medical management by a physician with specialized training in physical medicine and rehabilitation for the following reasons: Direction of a multidisciplinary physical rehabilitation program to maximize functional independence : Yes Medical management of patient stability for increased activity during participation in an intensive rehabilitation regime.: Yes Analysis of laboratory values and/or radiology  reports with any subsequent need for medication adjustment and/or medical intervention. : Yes   I attest that I was present, lead the team conference, and concur with the assessment and plan of the team.   Fredericka Sober B 04/06/2024, 3:38 PM

## 2024-04-06 NOTE — Discharge Instructions (Addendum)
 Inpatient Rehab Discharge Instructions  [name] Discharge date and time:    Activities/Precautions/ Functional Status: Activity: no lifting, driving, or strenuous exercise till cleared by MD Diet: cardiac diet and diabetic diet Wound Care: none needed   Functional status:  ___ No restrictions     ___ Walk up steps independently ___ 24/7 supervision/assistance   ___ Walk up steps with assistance _X__ Intermittent supervision/assistance  _X__ Bathe/dress independently ___ Walk with walker     ___ Bathe/dress with assistance _X__ Walk Independently    ___ Shower independently ___ Walk with assistance    ___ Shower with assistance _X__ No alcohol     ___ Return to work/school ________   Special Instructions: Needs assistance with medication/cognitive tasks.  COMMUNITY REFERRALS UPON DISCHARGE:    [facility] HAND OUT GIVEN TO PATIENT FOR FOLLOW UP SINCE IS UNINSURED  Medical Equipment/Items Ordered:CANE AND TUB BENCH                                                 Agency/Supplier:[facility]   [phone]  MATCH PLACED FOR ASSIST WITH PRESCRIPTIONS FOR 30 DAYS UNTIL CAN GET CONNECTED WITH CLINIC  [facility] [address] [place] [address] [phone] WALK IN CLINIC  Per [place]  DMV statutes, patients with seizures are not allowed to drive until  they have been seizure-free for six months. Use caution when using heavy equipment or power tools. Avoid working on ladders or at heights. Take showers instead of baths. Ensure the water temperature is not too high on the home water heater. Do not go swimming alone. When caring for infants or small children, sit down when holding, feeding, or changing them to minimize risk of injury to the child in the event you have a seizure. Also, Maintain good sleep hygiene. Avoid alcohol.   STROKE/TIA DISCHARGE INSTRUCTIONS SMOKING Cigarette smoking nearly doubles your risk of having  a stroke & is the single most alterable risk factor  If you smoke or have smoked in the last 12 months, you are advised to quit smoking for your health. Most of the excess cardiovascular risk related to smoking disappears within a year of stopping. Ask you doctor about anti-smoking medications [place] Quit Line: 1-800-QUIT NOW Free Smoking Cessation Classes [phone]  CHOLESTEROL Know your levels; limit fat & cholesterol in your diet  Lipid Panel     Component Value Date/Time   CHOL 233 (H) 03/29/2024 0718   TRIG 425 (H) 03/29/2024 0718   HDL 38 (L) 03/29/2024 0718   CHOLHDL 6.1 03/29/2024 0718   VLDL UNABLE TO CALCULATE IF TRIGLYCERIDE OVER 400 mg/dL 91/94/7974 9281   LDLCALC UNABLE TO CALCULATE IF TRIGLYCERIDE OVER 400 mg/dL 91/94/7974 9281     Many patients benefit from treatment even if their cholesterol is at goal. Goal: Total Cholesterol (CHOL) less than 160 Goal:  Triglycerides (TRIG) less than 150 Goal:  HDL greater than 40 Goal:  LDL (LDLCALC) less than 100   BLOOD PRESSURE American Stroke Association blood pressure target is less that 120/80 mm/Hg  Your discharge blood pressure is:  BP: 104/85 Monitor your blood pressure Limit your salt and alcohol intake Many individuals will require more than one medication for high blood pressure  DIABETES (A1c is a blood sugar average for last 3 months) Goal HGBA1c is under 7% (HBGA1c  is blood sugar average for last 3 months)  Diabetes:     Lab Results  Component Value Date   HGBA1C 13.7 (H) 03/29/2024    Your HGBA1c can be lowered with medications, healthy diet, and exercise. Check your blood sugar as directed by your physician Call your physician if you experience unexplained or low blood sugars.  PHYSICAL ACTIVITY/REHABILITATION Goal is 30 minutes at least 4 days per week  Activity: No driving, Therapies: see above Return to work: to be decided on follow up Activity decreases your risk of heart attack and stroke and makes your  heart stronger.  It helps control your weight and blood pressure; helps you relax and can improve your mood. Participate in a regular exercise program. Talk with your doctor about the best form of exercise for you (dancing, walking, swimming, cycling).  DIET/WEIGHT Goal is to maintain a healthy weight  Your discharge diet is:  Diet Order             Diet Carb Modified Fluid consistency: Thin; Room service appropriate? Yes  Diet effective now                   liquids Your height is:  Height: 5' 2 (157.5 cm) Your current weight is: Weight: 64.3 kg Your Body Mass Index (BMI) is:  BMI (Calculated): 25.92 Following the type of diet specifically designed for you will help prevent another stroke. Your goal weight  is:   Your goal Body Mass Index (BMI) is 19-24. Healthy food habits can help reduce 3 risk factors for stroke:  High cholesterol, hypertension, and excess weight.  RESOURCES Stroke/Support Group:  Call [phone]   STROKE EDUCATION PROVIDED/REVIEWED AND GIVEN TO PATIENT Stroke warning signs and symptoms How to activate emergency medical system (call 911). Medications prescribed at discharge. Need for follow-up after discharge. Personal risk factors for stroke. Pneumonia vaccine given:  Flu vaccine given:  My questions have been answered, the writing is legible, and I understand these instructions.  I will adhere to these goals & educational materials that have been provided to me after my discharge from the hospital.     My questions have been answered and I understand these instructions. I will adhere to these goals and the provided educational materials after my discharge from the hospital.  Patient/Caregiver Signature _______________________________ Date __________  Clinician Signature _______________________________________ Date __________  Please bring this form and your medication list with you to all your follow-up doctor's appointments.     _______________________________________________________________________________________________________________________ Information on my medicine - ELIQUIS  (apixaban )  This medication education was reviewed with me or my healthcare representative as part of my discharge preparation.    Why was Eliquis  prescribed for you? Eliquis  was prescribed for you to reduce the risk of a blood clot forming that can cause a stroke if you have a medical condition called atrial fibrillation (a type of irregular heartbeat). Indication: Bilateral Common Carotid Artery thrombi, acute stroke (CVA).   What do You need to know about Eliquis  ? Take your Eliquis  TWICE DAILY - one tablet in the morning and one tablet in the evening with or without food. If you have difficulty swallowing the tablet whole please discuss with your pharmacist how to take the medication safely.  Take Eliquis  exactly as prescribed by your doctor and DO NOT stop taking Eliquis  without talking to the doctor who prescribed the medication.  Stopping may increase your risk of developing a stroke.  Refill your prescription before you run out.  After discharge, you should have regular check-up appointments with your healthcare provider that is prescribing your Eliquis .  In the future your dose may need to be changed if your kidney function or weight changes by a significant amount or as you get older.  What do you do if you miss a dose? If you miss a dose, take it as soon as you remember on the same day and resume taking twice daily.  Do not take more than one dose of ELIQUIS  at the same time to make up a missed dose.  Important Safety Information A possible side effect of Eliquis  is bleeding. You should call your healthcare provider right away if you experience any of the following: Bleeding from an injury or your nose that does not stop. Unusual colored urine (red or dark brown) or unusual colored stools (red or black). Unusual  bruising for unknown reasons. A serious fall or if you hit your head (even if there is no bleeding).  Some medicines may interact with Eliquis  and might increase your risk of bleeding or clotting while on Eliquis . To help avoid this, consult your healthcare provider or pharmacist prior to using any new prescription or non-prescription medications, including herbals, vitamins, non-steroidal anti-inflammatory drugs (NSAIDs) and supplements.  This website has more information on Eliquis  (apixaban ): http://[email or web address] .com/eliquis dena

## 2024-04-06 NOTE — Progress Notes (Signed)
 Speech Language Pathology Discharge Summary  Patient Details  Name: Terri [name] MRN: [id number] Date of Birth: 05-15-1976  Date of Discharge from SLP service:April 06, 2024  Today's Date: 04/06/2024 SLP Individual Time: 0902-0957 SLP Individual Time Calculation (min): 55 min   Skilled Therapeutic Interventions:   Skilled therapy session focused on cognitive goals. SLP facilitated session by re-evaluating patients cognitive skills through completion of the [employer]ogn[employer] standardized assessment. Patient scored WFL on all subtests despite mild deficits in attention and memory. Although test reports mild deficits in these categories, patient is able to sustain attention to functional tasks for >20 minutes at a time and recall new and daily information after a delay. Patient then completed deductive reasoning puzzle at the end of the session. Patient left in bed with call bell in reach. Continue POC.     Patient has met 3 of 3 long term goals.  Patient to discharge at overall Supervision level.  Reasons goals not met: n/a   Clinical Impression/Discharge Summary:  Pt has made great gains and has met 3 of 3 LTG's this admission due to improved cognition. Pt is currently an overall supervisionA for cognitive tasks including basic problem solving, sustained attention and awareness. Patient continues to demonstrate occasional delayed processing speed and flat affect, though given extra time to complete tasks, she is able to correctly with supervisionA.  Pt/family education complete and pt will discharge home with supervision from friends/family/etc. Pt would benefit from f/u ST services to maximize processing speed in order to maximize functional independence.   Care Partner:  Caregiver Able to Provide Assistance: Yes  Type of Caregiver Assistance: Physical;Cognitive  Recommendation:  Outpatient SLP  Rationale for SLP Follow Up: Maximize cognitive function and independence   Equipment: n/a    Reasons for discharge: Treatment goals met;Discharged from hospital   Patient/Family Agrees with Progress Made and Goals Achieved: Yes    Zyrion Coey M.A., CCC-SLP 04/06/2024, 9:39 AM

## 2024-04-06 NOTE — Progress Notes (Signed)
 Physical Therapy Discharge Summary  Patient Details  Name: Terri [name] MRN: [id number] Date of Birth: Jan 17, 1976  Date of Discharge from PT service:April 06, 2024  Today's Date: 04/06/2024 PT Individual Time: 0805-0902 PT Individual Time Calculation (min): 57 min    Patient has met 8 of 8 long term goals due to improved activity tolerance, improved balance, increased strength, functional use of  left upper extremity and left lower extremity, and improved awareness.  Patient to discharge at an ambulatory level Independent.   Patient's care partner is independent to provide the necessary physical and cognitive assistance at discharge.  Reasons goals not met: n/a  Recommendation:  Patient would benefit from ongoing skilled PT services in [facility], however pt uninsured and so provided with [facility] clinics in area in order to continue to advance safe functional mobility, address ongoing impairments in strength, coordination, balance, activity tolerance, cognition, safety awareness, and to minimize fall risk.  Equipment: SPC  Reasons for discharge: treatment goals met  Patient/family agrees with progress made and goals achieved: Yes  PT Discharge Precautions/Restrictions Precautions Precautions: Fall Restrictions Weight Bearing Restrictions Per Provider Order: No Vital Signs   Pain Pain Assessment Pain Scale: 0-10 Pain Score: 5  Pain Location: Head Pain Interference Pain Interference Pain Effect on Sleep: 1. Rarely or not at all Pain Interference with Therapy Activities: 1. Rarely or not at all Pain Interference with Day-to-Day Activities: 1. Rarely or not at all Vision/Perception  Vision - History Ability to See in Adequate Light: 0 Adequate Vision - Assessment Eye Alignment: Within Functional Limits Ocular Range of Motion: Within Functional Limits Alignment/Gaze Preference: Within Defined Limits Tracking/Visual Pursuits: Able to track stimulus in all quads  without difficulty Saccades: Within functional limits Convergence: Within functional limits Perception Perception: Impaired Preception Impairment Details: Spatial orientation Perception-Other Comments: very mild spatial orientation deficit, improvement from inital eval Praxis Praxis: Impaired Praxis Impairment Details: Motor planning Praxis-Other Comments: Mild in L UE  Cognition Overall Cognitive Status: Impaired/Different from baseline Arousal/Alertness: Awake/alert Focused Attention: Appears intact Sustained Attention: Appears intact Memory: Appears intact Awareness: Impaired Awareness Impairment: Anticipatory impairment (requires verbal cues for safety during high level IADLs) Problem Solving: Impaired Problem Solving Impairment: Verbal complex;Functional complex Safety/Judgment: Appears intact (intact for ADLs) Sensation Sensation Light Touch: Impaired by gross assessment Hot/Cold: Appears Intact Proprioception: Appears Intact Additional Comments: Reports dullness of sensation on L hemi body but WFL with assessment Coordination Gross Motor Movements are Fluid and Coordinated: No Fine Motor Movements are Fluid and Coordinated: No Coordination and Movement Description: Mild coordination deficit in L hemibody Finger Nose Finger Test: LUE impaired, slow with mild dysmetria; improvement from intial eval Motor  Motor Motor - Skilled Clinical Observations: mild L hemiparesis, improved from inital eval  Mobility Bed Mobility Bed Mobility: Rolling Right;Rolling Left;Left Sidelying to Sit Rolling Right: Independent Rolling Left: Independent Left Sidelying to Sit: Independent Transfers Transfers: Sit to Stand;Stand Pivot Transfers;Stand to Sit Sit to Stand: Independent Stand to Sit: Independent Stand Pivot Transfers: Independent Transfer (Assistive device): None Locomotion  Gait Ambulation: Yes Gait Assistance: Independent with assistive device Assistive device:  None;Straight cane Gait Gait: Yes Gait Pattern: Impaired Gait Pattern: Step-through pattern;Decreased weight shift to left Gait velocity: decreased - continues to demo slow pace but improved quality of gait High Level Ambulation High Level Ambulation: Side stepping;Backwards walking Side Stepping: Mod I for lateral stepping, sup with vc for lateral stepping over 6 hurdles Backwards Walking: Improved quality of gait since [facility] with good toe to heel progression  Stairs / Additional Locomotion Stairs: Yes Stairs Assistance: Independent with assistive device Stair Management Technique: One rail Right Height of Stairs: 6 Ramp: Independent with assistive device Curb: Independent with assistive device Wheelchair Mobility Wheelchair Mobility: No  Trunk/Postural Assessment  Cervical Assessment Cervical Assessment: Within Functional Limits Thoracic Assessment Thoracic Assessment: Within Functional Limits Lumbar Assessment Lumbar Assessment: Within Functional Limits Postural Control Postural Control: Deficits on evaluation Righting Reactions: delayed, improved from eval Protective Responses: delayed, improved from eval  Balance Balance Balance Assessed: Yes Standardized Balance Assessment Standardized Balance Assessment: Functional Gait Assessment;Berg Balance Test Berg Balance Test Sit to Stand: Able to stand without using hands and stabilize independently Standing Unsupported: Able to stand safely 2 minutes Sitting with Back Unsupported but Feet Supported on Floor or Stool: Able to sit safely and securely 2 minutes Stand to Sit: Controls descent by using hands Transfers: Able to transfer safely, minor use of hands Standing Unsupported with Eyes Closed: Able to stand 10 seconds with supervision Standing Ubsupported with Feet Together: Able to place feet together independently and stand for 1 minute with supervision From Standing, Reach Forward with Outstretched Arm: Can reach forward  >12 cm safely (5) From Standing Position, Pick up Object from Floor: Able to pick up shoe safely and easily From Standing Position, Turn to Look Behind Over each Shoulder: Looks behind one side only/other side shows less weight shift Turn 360 Degrees: Able to turn 360 degrees safely but slowly Standing Unsupported, Alternately Place Feet on Step/Stool: Able to complete 4 steps without aid or supervision Standing Unsupported, One Foot in Front: Able to plae foot ahead of the other independently and hold 30 seconds Standing on One Leg: Able to lift leg independently and hold equal to or more than 3 seconds Total Score: 44 Static Sitting Balance Static Sitting - Balance Support: No upper extremity supported;Feet supported Static Sitting - Level of Assistance: 7: Independent Dynamic Sitting Balance Dynamic Sitting - Balance Support: No upper extremity supported;Feet supported Dynamic Sitting - Level of Assistance: 7: Independent Static Standing Balance Static Standing - Balance Support: During functional activity Static Standing - Level of Assistance: 7: Independent Dynamic Standing Balance Dynamic Standing - Balance Support: No upper extremity supported;During functional activity Dynamic Standing - Level of Assistance: 7: Independent Functional Gait  Assessment Gait assessed : Yes Gait Level Surface: Walks 20 ft in less than 5.5 sec, no assistive devices, good speed, no evidence for imbalance, normal gait pattern, deviates no more than 6 in outside of the 12 in walkway width. Change in Gait Speed: Makes only minor adjustments to walking speed, or accomplishes a change in speed with significant gait deviations, deviates 10-15 in outside the 12 in walkway width, or changes speed but loses balance but is able to recover and continue walking. Gait with Horizontal Head Turns: Performs head turns smoothly with slight change in gait velocity (eg, minor disruption to smooth gait path), deviates 6-10 in  outside 12 in walkway width, or uses an assistive device. Gait with Vertical Head Turns: Performs task with slight change in gait velocity (eg, minor disruption to smooth gait path), deviates 6 - 10 in outside 12 in walkway width or uses assistive device Gait and Pivot Turn: Turns slowly, requires verbal cueing, or requires several small steps to catch balance following turn and stop Step Over Obstacle: Is able to step over one shoe box (4.5 in total height) without changing gait speed. No evidence of imbalance. Gait with Narrow Base of Support: Ambulates 4-7 steps. Gait  with Eyes Closed: Walks 20 ft, no assistive devices, good speed, no evidence of imbalance, normal gait pattern, deviates no more than 6 in outside 12 in walkway width. Ambulates 20 ft in less than 7 sec. Ambulating Backwards: Walks 20 ft, no assistive devices, good speed, no evidence for imbalance, normal gait Steps: Alternating feet, no rail. Total Score: 21 FGA comment:: Medium Fall Risk (21/30) Extremity Assessment  RUE Assessment RUE Assessment: Within Functional Limits LUE Assessment LUE Assessment: Exceptions to [facility] Strength Comments: 4-/5 grossly, mild FMC deficit with slowed motor movements RLE Assessment RLE Assessment: Within Functional Limits LLE Assessment LLE Assessment: Exceptions to [facility] LLE Strength Left Hip Flexion: 4/5 Left Hip Extension: 4/5 Left Hip ABduction: 4/5 Left Hip ADduction: 4/5 Left Knee Flexion: 4-/5 Left Knee Extension: 4/5 Left Ankle Dorsiflexion: 4/5 Left Ankle Plantar Flexion: 4/5  Skilled Intervention: PT instructed pt in Grad day assessment to measure progress toward goals. See above for details. CARETool mobility assessment  also completed; see CAREtool tab in navigator for details.  Patient supine in bed on entrance to room. Patient alert and agreeable to PT session.   Patient with no pain complaint at start of session.  Pt relates desire to have SPC in cases of  increased weakness or feeling of instability. Agreeable to ambulate using SPC for gait training this session.   Therapeutic Activity: Pt is able to pull covers, get OOB, collect clothes for dressing, dress and don shoes with IND.   Gait Training:  SPC obtained and demonstrated to pt to advance SPC in Rhand at same time she advances LLE in stepping. She is able to ambulate 300 ft using SPC with IND. Then ambulates 200-300 ft bouts throughout department with [place] and using correctly.   Neuromuscular Re-ed: NMR facilitated during session with focus on standing balance. Pt guided in reassessment of FGA and Ber Balance. Se results above. Minimal improvement in assessments d/t short length of stay, but improvement noted. NMR performed for improvements in motor control and coordination, balance, sequencing, judgement, and self confidence/ efficacy in performing all aspects of mobility at highest level of independence.   Patient supine in bed at end of session with brakes locked, no alarm set as pt Mod I in room, and all needs within reach.  Mliss DELENA Milliner PT, DPT, CSRS 04/06/2024, 6:09 PM

## 2024-04-06 NOTE — Progress Notes (Signed)
 Patient ID: Terri [name], female   DOB: July 07, 1976, 48 y.o.   MRN: [id number] Met with pt to give team conference update regarding goals of mod/I level and meeting these goals by tomorrow. She feels ready and will have a ride for going home. Have given [facility] clinic handout for follow up therapies and have ordered cane and tub bench via [employer]. Has been set up via [facility] for PCP and will get match for 30 days of prescription coverage. She is asking about the monitoring system for BS. She will ask MD/PA. Prepare for discharge tomorrow.

## 2024-04-06 NOTE — Progress Notes (Signed)
 PROGRESS NOTE   Subjective/Complaints:  Had a death in family yesterday, funeral on Sat No shoulder pain  ROS: as per HPI. Denies CP, SOB, abd pain, N/V/D/C, or any other complaints at this time.   +anxiety  Objective:   No results found.  Recent Labs    04/04/24 0534  WBC 8.6  HGB 13.4  HCT 39.6  PLT 323   Recent Labs    04/04/24 0534  NA 138  K 4.0  CL 111  CO2 19*  GLUCOSE 152*  BUN 9  CREATININE 0.68  CALCIUM  9.0    Intake/Output Summary (Last 24 hours) at 04/06/2024 1038 Last data filed at 04/06/2024 0723 Gross per 24 hour  Intake 953 ml  Output --  Net 953 ml         Physical Exam: Vital Signs Blood pressure 115/75, pulse 91, temperature 98.8 F (37.1 C), temperature source Oral, resp. rate 20, height 5' 2 (1.575 m), weight 64.3 kg, SpO2 100%.   General: No acute distress, resting comfortably in bed.  Mood and affect are appropriate, slightly anxious Heart: Regular rate and rhythm no rubs murmurs or extra sounds Lungs: Clear to auscultation, breathing unlabored, no rales or wheezes Abdomen: Positive bowel sounds, soft nontender to palpation, nondistended Extremities: No clubbing, cyanosis, or edema Skin: No evidence of breakdown, no evidence of rash over exposed surfaces.    Neurologic: Cranial nerves II through XII intact, motor strength is 5/5 in Right and 4/5 Left deltoid, bicep, tricep, grip, hip flexor, knee extensors, ankle dorsiflexor and plantar flexor Sensory exam normal sensation to light touch and proprioception in bilateral upper and lower extremities Cerebellar exam normal finger to nose to finger right side mild dysmetria left side Musculoskeletal: Full range of motion in all 4 extremities. No joint swelling   Assessment/Plan: 1. Functional deficits which require 3+ hours per day of interdisciplinary therapy in a comprehensive inpatient rehab setting. Physiatrist is  providing close team supervision and 24 hour management of active medical problems listed below. Physiatrist and rehab team continue to assess barriers to discharge/monitor patient progress toward functional and medical goals  Care Tool:  Bathing    Body parts bathed by patient: Right arm, Left arm, Chest, Abdomen, Front perineal area, Buttocks, Right upper leg, Left upper leg, Right lower leg, Left lower leg, Face         Bathing assist Assist Level: Contact Guard/Touching assist     Upper Body Dressing/Undressing Upper body dressing   What is the patient wearing?: Pull over shirt    Upper body assist Assist Level: Supervision/Verbal cueing    Lower Body Dressing/Undressing Lower body dressing      What is the patient wearing?: Underwear/pull up, Pants     Lower body assist Assist for lower body dressing: Contact Guard/Touching assist     Toileting Toileting    Toileting assist Assist for toileting: Contact Guard/Touching assist     Transfers Chair/bed transfer  Transfers assist     Chair/bed transfer assist level: Contact Guard/Touching assist     Locomotion Ambulation   Ambulation assist      Assist level: Contact Guard/Touching assist Assistive device: No Device Max distance:  250 ft   Walk 10 feet activity   Assist     Assist level: Contact Guard/Touching assist Assistive device: No Device   Walk 50 feet activity   Assist    Assist level: Contact Guard/Touching assist Assistive device: No Device    Walk 150 feet activity   Assist    Assist level: Contact Guard/Touching assist Assistive device: No Device    Walk 10 feet on uneven surface  activity   Assist     Assist level: Minimal Assistance - Patient > 75% Assistive device: Other (comment) (no device)   Wheelchair     Assist Is the patient using a wheelchair?: No (Did not use during eval and not expected to use after d/c)   Wheelchair activity did not occur:  N/A         Wheelchair 50 feet with 2 turns activity    Assist    Wheelchair 50 feet with 2 turns activity did not occur: N/A       Wheelchair 150 feet activity     Assist  Wheelchair 150 feet activity did not occur: N/A          Blood pressure 115/75, pulse 91, temperature 98.8 F (37.1 C), temperature source Oral, resp. rate 20, height 5' 2 (1.575 m), weight 64.3 kg, SpO2 100%.  Medical Problem List and Plan: 1. Functional deficits secondary to Subacute R MCA stroke             -patient may  shower             -ELOS/Goals: 8/14 mod I/ Supervision Team conference today please see physician documentation under team conference tab, met with team  to discuss problems,progress, and goals. Formulized individual treatment plan based on medical history, underlying problem and comorbidities.  2.  H/o DVT/Antithrombotics: -DVT/anticoagulation:  Pharmaceutical: Eliquis  5mg  BID             -antiplatelet therapy: N/A 3. Pain Management: tylenol  prn. Topamax  25mg  nightly added for constant HA- no c/o today  4. Mood/Behavior/Sleep: LCSW to follow for evaluation and support.              -antipsychotic agents:  N/A  -Trazodone  PRN 5. Neuropsych/cognition: This patient is capable of making decisions on her own behalf. 6. Skin/Wound Care: Routine pressure relief measures.  7. Fluids/Electrolytes/Nutrition: Monitor I/O.  8. T2DM: New diagnosis w/A1C-13.7. Now on insulin , Semglee  15U BID, Novolog  4U TID WC             --monitor BS ac/hs and use SSI for elevated BS -04/02/24 CBGs variable mostly <200 but some higher, will see trend before deciding on meds, Metformin  might be a good option to start too? -04/03/24 CBGs a bit better today, <200 since yesterday; consider metformin , defer to weekday team CBG (last 3)  Recent Labs    04/05/24 1638 04/05/24 2137 04/06/24 0632  GLUCAP 153* 107* 163*  Controlled 04/05/2024 9. New onset seizure: On Keppra  1000mg  bid 10. Elevated lipids:  Chol-233, LDL 132, Trig-425. Now on Lipitor  80 mg daily.  11. H/o Mood disorder Schizoaffective d/o and Bipolar written in chart- not clear since hasn't been on meds/BPD/Depression: Not on any medications since her 85's.  --Adjustment issues now--wants something mild maybe as needed. Buspar  5mg  TID PRN added 12. GERD/Bloating: Probiotic added. Consider protonix while here? Weekday team to address 13. H/o Tobacco/Marijuana use: Educate on importance of cessation 14. Infection?? Pt on Doxycycline  100mg  BID starting 04/01/24-- unclear indication, weekday team to clarify  LOS: 6 days A FACE TO FACE EVALUATION WAS PERFORMED  Prentice FORBES Compton 04/06/2024, 10:38 AM

## 2024-04-07 ENCOUNTER — Other Ambulatory Visit (HOSPITAL_COMMUNITY): Payer: Self-pay

## 2024-04-07 DIAGNOSIS — F251 Schizoaffective disorder, depressive type: Secondary | ICD-10-CM

## 2024-04-07 DIAGNOSIS — L732 Hidradenitis suppurativa: Secondary | ICD-10-CM | POA: Insufficient documentation

## 2024-04-07 DIAGNOSIS — E785 Hyperlipidemia, unspecified: Secondary | ICD-10-CM | POA: Insufficient documentation

## 2024-04-07 DIAGNOSIS — R519 Headache, unspecified: Secondary | ICD-10-CM

## 2024-04-07 DIAGNOSIS — E119 Type 2 diabetes mellitus without complications: Secondary | ICD-10-CM

## 2024-04-07 DIAGNOSIS — Z794 Long term (current) use of insulin: Secondary | ICD-10-CM

## 2024-04-07 LAB — BASIC METABOLIC PANEL WITH GFR
Anion gap: 8 (ref 5–15)
BUN: 10 mg/dL (ref 6–20)
CO2: 18 mmol/L — ABNORMAL LOW (ref 22–32)
Calcium: 9.2 mg/dL (ref 8.9–10.3)
Chloride: 112 mmol/L — ABNORMAL HIGH (ref 98–111)
Creatinine, Ser: 0.64 mg/dL (ref 0.44–1.00)
GFR, Estimated: >60 mL/min (ref >60)
Glucose, Bld: 149 mg/dL — ABNORMAL HIGH (ref 70–99)
Potassium: 3.5 mmol/L (ref 3.5–5.1)
Sodium: 138 mmol/L (ref 135–145)

## 2024-04-07 LAB — GLUCOSE, CAPILLARY: Glucose-Capillary: 167 mg/dL — ABNORMAL HIGH (ref 70–99)

## 2024-04-07 MED ORDER — ATORVASTATIN CALCIUM 80 MG PO TABS
80.0000 mg | ORAL_TABLET | Freq: Every day | ORAL | 0 refills | Status: DC
Start: 2024-04-07 — End: 2024-04-29
  Filled 2024-04-07: qty 30, 30d supply, fill #0

## 2024-04-07 MED ORDER — LANCETS MISC. MISC: 1.0000 | Freq: Three times a day (TID) | 0 refills | Status: AC

## 2024-04-07 MED ORDER — FREESTYLE LIBRE 3 SENSOR MISC
1.0000 | 0 refills | Status: DC
  Filled 2024-04-07: qty 2, 28d supply, fill #0

## 2024-04-07 MED ORDER — BLOOD GLUCOSE TEST VI STRP: 1.0000 | ORAL_STRIP | Freq: Three times a day (TID) | 0 refills | Status: AC

## 2024-04-07 MED ORDER — BLOOD GLUCOSE MONITORING SUPPL DEVI: 1.0000 | Freq: Three times a day (TID) | 0 refills | Status: DC

## 2024-04-07 MED ORDER — LANCET DEVICE MISC: 1.0000 | Freq: Three times a day (TID) | 0 refills | Status: AC

## 2024-04-07 MED ORDER — FREESTYLE LIBRE 3 READER DEVI
1.0000 | Freq: Three times a day (TID) | 0 refills | Status: DC
  Filled 2024-04-07: qty 1, 30d supply, fill #0

## 2024-04-07 MED ORDER — FREESTYLE LIBRE 2 READER DEVI: 1.0000 | Freq: Four times a day (QID) | 0 refills | Status: DC

## 2024-04-07 NOTE — Plan of Care (Addendum)
  Problem: RH Balance Goal: LTG Patient will maintain dynamic standing balance (PT) Description: LTG:  Patient will maintain dynamic standing balance with assistance during mobility activities (PT) Flowsheets (Taken 04/02/2024 0558) LTG: Pt will maintain dynamic standing balance during mobility activities with:: Independent with assistive device    Problem: Sit to Stand Goal: LTG:  Patient will perform sit to stand with assistance level (PT) Description: LTG:  Patient will perform sit to stand with assistance level (PT) Flowsheets (Taken 04/02/2024 0558) LTG: PT will perform sit to stand in preparation for functional mobility with assistance level: Independent   Problem: RH Bed to Chair Transfers Goal: LTG Patient will perform bed/chair transfers w/assist (PT) Description: LTG: Patient will perform bed to chair transfers with assistance (PT). Flowsheets (Taken 04/07/2024 1124) LTG: Pt will perform Bed to Chair Transfers with assistance level: Independent   Problem: RH Car Transfers Goal: LTG Patient will perform car transfers with assist (PT) Description: LTG: Patient will perform car transfers with assistance (PT). Flowsheets (Taken 04/02/2024 0558) LTG: Pt will perform car transfers with assist:: Supervision/Verbal cueing   Problem: RH Ambulation Goal: LTG Patient will ambulate in home environment (PT) Description: LTG: Patient will ambulate in home environment, # of feet with assistance (PT). Flowsheets (Taken 04/02/2024 0558) LTG: Pt will ambulate in home environ  assist needed:: Independent with assistive device LTG: Ambulation distance in home environment: up to 50 ft using LRAD Goal: LTG Patient will ambulate in community environment (PT) Description: LTG: Patient will ambulate in community environment, # of feet with assistance (PT). Flowsheets (Taken 04/02/2024 0558) LTG: Pt will ambulate in community environ  assist needed:: Supervision/Verbal cueing LTG: Ambulation distance in  community environment: more than 300 ft using LRAD   Problem: RH Stairs Goal: LTG Patient will ambulate up and down stairs w/assist (PT) Description: LTG: Patient will ambulate up and down # of stairs with assistance (PT) Flowsheets (Taken 04/02/2024 0558) LTG: Pt will ambulate up/down stairs assist needed:: Independent with assistive device LTG: Pt will  ambulate up and down number of stairs: at least 3 steps using HR setup as per home environment

## 2024-04-07 NOTE — Progress Notes (Signed)
 Inpatient Rehabilitation Care Coordinator Discharge Note   Patient Details  Name: Terri [name] MRN: [id number] Date of Birth: [birth year]   Discharge location: HOME WITH MOM WHO IS ABLE TO PROVIDE SUPERVISION LEVEL  Length of Stay: 7 DAYS  Discharge activity level: MOD/I LEVEL  Home/community participation: ACTIVE  Patient response un:Yzjouy Literacy - How often do you need to have someone help you when you read instructions, pamphlets, or other written material from your doctor or pharmacy?: Never  Patient response un:Dnrpjo Isolation - How often do you feel lonely or isolated from those around you?: Never  Services provided included: MD, RD, PT, OT, SLP, RN, CM, TR, Pharmacy, Neuropsych, SW  Financial Services:  Field seismologist Utilized: Other (Comment) (UNINSURED-MEDICAID PENDING)    Choices offered to/list presented to: PT  Follow-up services arranged:  DME      DME : [employer] [facility]  [facility] INFORMATION GIVEN TO PT DUE TO UNINSURED. MATCH PLACED FOR 30 DAY PRESCRIPTION COVERAGE UNTIL CAN GO TO CLINIC AND GET CONNECTED WITH [facility]. [facility] INFORMATION ON AVS FOR FOLLOW UP FOR FOLLOW UP FOR BIPOLAR/SCHIZOAFFECTIVE DISORDER  Patient response to transportation need: Is the patient able to respond to transportation needs?: Yes In the past 12 months, has lack of transportation kept you from medical appointments or from getting medications?: No In the past 12 months, has lack of transportation kept you from meetings, work, or from getting things needed for daily living?: No   Patient/Family verbalized understanding of follow-up arrangements:  Yes  Individual responsible for coordination of the follow-up plan: SELF [phone]  Confirmed correct DME delivered: Raymonde Asberry MATSU 04/07/2024    Comments (or additional information):PT DID WELL AND RECOVERED FROM HER STROKE. HER MOM IS RETIRED AND CAN BE  THERE  Summary of Stay    Date/Time Discharge Planning CSW  04/06/24 0904 Home with mom who is retired and can assist if needed. Pt hopes to be mod/i and not bother her Mom. Awaiting DME and limited follow up due to uninsured. Set up to go to [facility] for PCP. Medicaid pending RGD       Sherrina Zaugg, Asberry MATSU

## 2024-04-07 NOTE — Progress Notes (Signed)
 Inpatient Rehabilitation Discharge Medication Review by a Pharmacist  A complete drug regimen review was completed for this patient to identify any potential clinically significant medication issues.  High Risk Drug Classes Is patient taking? Indication by Medication  Antipsychotic No   Anticoagulant Yes Apixaban  - B/L intraluminal thrombi, subacute CVA   Antibiotic Yes Doxycycline  - hidraadenitis   Opioid No   Antiplatelet No   Hypoglycemics/insulin  Yes Insulin  glargine  - T2DM  Vasoactive Medication No   Chemotherapy No   Other Yes Acetaminophen  -pain Atorvastatin  - HLD Buspar  -  anxiety, adjustment issues Florastor - probiotic Levetiracetam  - seizure Miralax - constipation Topiramate  - constant headache pain     Type of Medication Issue Identified Description of Issue Recommendation(s)  Drug Interaction(s) (clinically significant)     Duplicate Therapy     Allergy     No Medication Administration End Date     Incorrect Dose     Additional Drug Therapy Needed     Significant med changes from prior encounter (inform family/care partners about these prior to discharge). No medications prior to admission. All current medications are new. Communicate to patient /family/ caregiver prior to discharge.  Other       Clinically significant medication issues were identified that warrant physician communication and completion of prescribed/recommended actions by midnight of the next day:  No  Name of provider notified for urgent issues identified:   Provider Method of Notification:   Pharmacist comments: No medications prior to admission  Time spent performing this drug regimen review (minutes):  30    Thank you for allowing pharmacy to be part of this patients care team.   Levorn Gaskins, RPh Clinical Pharmacist 04/07/2024 10:39 AM

## 2024-04-07 NOTE — Plan of Care (Addendum)
  Problem: RH Balance Goal: LTG Patient will maintain dynamic standing balance (PT) Description: LTG:  Patient will maintain dynamic standing balance with assistance during mobility activities (PT) 04/07/2024 1128 by Thaddeus Mliss LABOR, PT Outcome: Completed/Met 04/07/2024 1124 by Thaddeus Mliss LABOR, PT Flowsheets (Taken 04/02/2024 [phone]) LTG: Pt will maintain dynamic standing balance during mobility activities with:: Independent with assistive device    Problem: Sit to Stand Goal: LTG:  Patient will perform sit to stand with assistance level (PT) Description: LTG:  Patient will perform sit to stand with assistance level (PT) 04/07/2024 1128 by Thaddeus Mliss LABOR, PT Outcome: Completed/Met 04/07/2024 1124 by Thaddeus Mliss LABOR, PT Flowsheets (Taken 04/02/2024 [phone]) LTG: PT will perform sit to stand in preparation for functional mobility with assistance level: Independent   Problem: RH Bed to Chair Transfers Goal: LTG Patient will perform bed/chair transfers w/assist (PT) Description: LTG: Patient will perform bed to chair transfers with assistance (PT). 04/07/2024 1128 by Thaddeus Mliss LABOR, PT Outcome: Completed/Met 04/07/2024 1124 by Thaddeus Mliss LABOR, PT Flowsheets (Taken 04/07/2024 1124) LTG: Pt will perform Bed to Chair Transfers with assistance level: Independent   Problem: RH Car Transfers Goal: LTG Patient will perform car transfers with assist (PT) Description: LTG: Patient will perform car transfers with assistance (PT). 04/07/2024 1128 by Thaddeus Mliss LABOR, PT Outcome: Completed/Met 04/07/2024 1124 by Thaddeus Mliss LABOR, PT Flowsheets (Taken 04/02/2024 [phone]) LTG: Pt will perform car transfers with assist:: Supervision/Verbal cueing   Problem: RH Ambulation Goal: LTG Patient will ambulate in home environment (PT) Description: LTG: Patient will ambulate in home environment, # of feet with assistance (PT). 04/07/2024 1128 by Thaddeus Mliss LABOR, PT Outcome: Completed/Met 04/07/2024 1124 by Thaddeus Mliss LABOR, PT Flowsheets  (Taken 04/02/2024 [phone]) LTG: Pt will ambulate in home environ  assist needed:: Independent with assistive device LTG: Ambulation distance in home environment: up to 50 ft using LRAD Goal: LTG Patient will ambulate in community environment (PT) Description: LTG: Patient will ambulate in community environment, # of feet with assistance (PT). 04/07/2024 1128 by Thaddeus Mliss LABOR, PT Outcome: Completed/Met 04/07/2024 1124 by Thaddeus Mliss LABOR, PT Flowsheets (Taken 04/02/2024 [phone]) LTG: Pt will ambulate in community environ  assist needed:: Supervision/Verbal cueing LTG: Ambulation distance in community environment: more than 300 ft using LRAD   Problem: RH Stairs Goal: LTG Patient will ambulate up and down stairs w/assist (PT) Description: LTG: Patient will ambulate up and down # of stairs with assistance (PT) 04/07/2024 1128 by Thaddeus Mliss LABOR, PT Outcome: Completed/Met 04/07/2024 1124 by Thaddeus Mliss LABOR, PT Flowsheets (Taken 04/02/2024 [phone]) LTG: Pt will ambulate up/down stairs assist needed:: Independent with assistive device LTG: Pt will  ambulate up and down number of stairs: at least 3 steps using HR setup as per home environment

## 2024-04-07 NOTE — Progress Notes (Addendum)
 PROGRESS NOTE   Subjective/Complaints:  Patient looking forward to going home today.  No new concerns or complaints.  ROS: as per HPI. Denies fever, chills CP, SOB, abd pain, N/V/D/C, or any other complaints at this time.   +anxiety  Objective:   No results found.  No results for input(s): WBC, HGB, HCT, PLT in the last 72 hours.  Recent Labs    04/07/24 0435  NA 138  K 3.5  CL 112*  CO2 18*  GLUCOSE 149*  BUN 10  CREATININE 0.64  CALCIUM  9.2    Intake/Output Summary (Last 24 hours) at 04/07/2024 1649 Last data filed at 04/07/2024 0800 Gross per 24 hour  Intake 476 ml  Output --  Net 476 ml         Physical Exam: Vital Signs Blood pressure 110/67, pulse 96, temperature 98.3 F (36.8 C), resp. rate 18, height 5' 2 (1.575 m), weight 64.3 kg, SpO2 100%.   General: No acute distress, resting comfortably in bed.  Mood and affect are appropriate, slightly anxious Heart: Regular rate and rhythm no rubs murmurs or extra sounds Lungs: Clear to auscultation, breathing unlabored, no rales or wheezes Abdomen: Positive bowel sounds, soft nontender to palpation, nondistended Extremities: No clubbing, cyanosis, or edema Skin: No evidence of breakdown, no evidence of rash over exposed surfaces.    Neurologic: Cranial nerves II through XII intact, motor strength is 5/5 in Right and 4/5 Left deltoid, bicep, tricep, grip, hip flexor, knee extensors, ankle dorsiflexor and plantar flexor Sensory exam normal sensation to light touch and proprioception in bilateral upper and lower extremities Cerebellar exam normal finger to nose to finger right side mild dysmetria left side Musculoskeletal: Full range of motion in all 4 extremities. No joint swelling   Assessment/Plan: 1. Functional deficits which require 3+ hours per day of interdisciplinary therapy in a comprehensive inpatient rehab setting. Physiatrist is  providing close team supervision and 24 hour management of active medical problems listed below. Physiatrist and rehab team continue to assess barriers to discharge/monitor patient progress toward functional and medical goals  Care Tool:  Bathing    Body parts bathed by patient: Right arm, Left arm, Chest, Abdomen, Front perineal area, Buttocks, Right upper leg, Left upper leg, Right lower leg, Left lower leg, Face         Bathing assist Assist Level: Independent with assistive device     Upper Body Dressing/Undressing Upper body dressing   What is the patient wearing?: Pull over shirt    Upper body assist Assist Level: Independent    Lower Body Dressing/Undressing Lower body dressing      What is the patient wearing?: Underwear/pull up, Pants     Lower body assist Assist for lower body dressing: Independent     Toileting Toileting    Toileting assist Assist for toileting: Independent     Transfers Chair/bed transfer  Transfers assist     Chair/bed transfer assist level: Independent     Locomotion Ambulation   Ambulation assist      Assist level: Independent Assistive device: No Device Max distance: 300 ft   Walk 10 feet activity   Assist  Assist level: Independent Assistive device: No Device   Walk 50 feet activity   Assist    Assist level: Independent Assistive device: No Device    Walk 150 feet activity   Assist    Assist level: Independent Assistive device: No Device    Walk 10 feet on uneven surface  activity   Assist     Assist level: Independent with assistive device (extra time for focus or using SPC) Assistive device: Cane-straight   Wheelchair     Assist Is the patient using a wheelchair?: No (did not use during IPR stay and is not expected to use after d/c)   Wheelchair activity did not occur: N/A         Wheelchair 50 feet with 2 turns activity    Assist    Wheelchair 50 feet with 2 turns  activity did not occur: N/A       Wheelchair 150 feet activity     Assist  Wheelchair 150 feet activity did not occur: N/A          Blood pressure 110/67, pulse 96, temperature 98.3 F (36.8 C), resp. rate 18, height 5' 2 (1.575 m), weight 64.3 kg, SpO2 100%.  Medical Problem List and Plan: 1. Functional deficits secondary to Subacute R MCA stroke             -patient may  shower             -ELOS/Goals: 8/14 mod I/ Supervision DC home today  2.  H/o DVT/Antithrombotics: -DVT/anticoagulation:  Pharmaceutical: Eliquis  5mg  BID             -antiplatelet therapy: N/A 3. Pain Management: tylenol  prn. Topamax  25mg  nightly added for constant HA- no c/o today   - DC Topamax  4. Mood/Behavior/Sleep: LCSW to follow for evaluation and support.              -antipsychotic agents:  N/A  -Trazodone  PRN 5. Neuropsych/cognition: This patient is capable of making decisions on her own behalf. 6. Skin/Wound Care: Routine pressure relief measures.  7. Fluids/Electrolytes/Nutrition: Monitor I/O.  8. T2DM: New diagnosis w/A1C-13.7. Now on insulin , Semglee  15U BID, Novolog  4U TID WC             --monitor BS ac/hs and use SSI for elevated BS -04/02/24 CBGs variable mostly <200 but some higher, will see trend before deciding on meds, Metformin  might be a good option to start too? -04/03/24 CBGs a bit better today, <200 since yesterday; consider metformin , defer to weekday team CBG (last 3)  Recent Labs    04/06/24 1626 04/06/24 2206 04/07/24 0620  GLUCAP 169* 136* 167*  Fair control, follow-up with PCP for continued monitoring.  Currently patient is getting 18 units twice daily Semglee  9. New onset seizure: On Keppra  1000mg  bid 10. Elevated lipids: Chol-233, LDL 132, Trig-425. Now on Lipitor  80 mg daily.  11. H/o Mood disorder Schizoaffective d/o and Bipolar written in chart- not clear since hasn't been on meds/BPD/Depression: Not on any medications since her 35's.  --Adjustment issues  now--wants something mild maybe as needed. Buspar  5mg  TID PRN added Seen by neuropsychology, appreciate assistance-note reviewed 12. GERD/Bloating: Probiotic added. Consider protonix while here? Weekday team to address 13. H/o Tobacco/Marijuana use: Educate on importance of cessation 14. Infection?? Pt on Doxycycline  100mg  BID starting 04/01/24-- unclear indication, weekday team to clarify   LOS: 7 days A FACE TO FACE EVALUATION WAS PERFORMED  Murray Collier 04/07/2024, 4:49 PM

## 2024-04-07 NOTE — Plan of Care (Signed)
  Problem: Consults Goal: RH STROKE PATIENT EDUCATION Description: See Patient Education module for education specifics  Outcome: Progressing   Problem: Consults Goal: RH STROKE PATIENT EDUCATION Description: See Patient Education module for education specifics  04/07/2024 0644 by Miliano Cotten K, LPN Outcome: Progressing 04/07/2024 0644 by Elman Dettman K, LPN Outcome: Progressing

## 2024-04-07 NOTE — Discharge Summary (Cosign Needed)
 Physician Discharge Summary  Patient ID: LINNIE DELGRANDE MRN: [id number] DOB/AGE: 1976-08-16 48 y.o.  Admit date: 03/31/2024 Discharge date: 04/07/2024  Discharge Diagnoses:  Principal Problem:   Acute ischemic right MCA stroke [facility]) Active Problems:   GERD (gastroesophageal reflux disease)   Tobacco use disorder   Hydradenitis   Type 2 diabetes mellitus (HCC)   Elevated lipids   Discharged Condition: stable  Significant Diagnostic Studies: N/A   Labs:  Basic Metabolic Panel: Recent Labs  Lab 04/01/24 0527 04/04/24 0534 04/07/24 0435  NA 140 138 138  K 4.7 4.0 3.5  CL 107 111 112*  CO2 20* 19* 18*  GLUCOSE 214* 152* 149*  BUN 7 9 10   CREATININE 0.64 0.68 0.64  CALCIUM  9.1 9.0 9.2    CBC: Recent Labs  Lab 04/01/24 0527 04/04/24 0534  WBC 7.4 8.6  NEUTROABS 4.2  --   HGB 13.2 13.4  HCT 40.6 39.6  MCV 88.8 88.0  PLT 320 323    CBG: Recent Labs  Lab 04/06/24 0632 04/06/24 1143 04/06/24 1626 04/06/24 2206 04/07/24 0620  GLUCAP 163* 149* 169* 136* 167*    Brief HPI:   SURIYA KOVARIK is a 48 y.o. female with history of DVT, bipolar disorder, migraines, depression, history of dizziness and weakness for a week who was admitted on 03/28/2024 with witnessed seizure type episode, weakness and acute mental status changes.  She was found to have flaccid left hemiplegia and CT head showed acute thrombus in both CCA and abnormal enhancement right hemisphere and petechial hemorrhage right middle frontal gyrus.  She was loaded with Keppra  and started on IV heparin .  Neurology recommended full workup to rule out cause of emboli and CT abdomen, pelvis and BLE done showing superficial femoral artery to be completely occluded, moderate calcific atherosclerosis within aortoiliac arteries, and groundglass opacity RLL and LLL.  CTA chest was negative for PE.  BLE Dopplers were negative for DVT.    TEE showed intrapulmonary shunt with EF of 65 to 70% and no thrombus.   MRI brain done on 08/05 revealing multiple foci of T2 hyperintensity and cortical enhancement in right MCA territory.  Autoimmune panel and hypercoagulopathy panel were negative.  She was transition to Eliquis  with recommendations to repeat CTA head/neck in 2 to 3 months for follow-up and decision to transition to antiplatelets if thrombus had resolved.  She continued to be limited by left-sided weakness with sensory deficits and required cues for high-level cognitive tasks.  Therapy was working with patient was requiring min assist with ADLs and contact-guard assist with mobility.  She was independent prior to admission and CIR was recommended due to functional decline.   Hospital Course: AAILYAH DUNBAR was admitted to rehab 03/31/2024 for inpatient therapies to consist of PT, ST and OT at least three hours five days a week. Past admission physiatrist, therapy team and rehab RN have worked together to provide customized collaborative inpatient rehab.  She was maintained on Eliquis  twice daily and has been tolerating this without any side effects.  She reported persistent headache and low-dose Topamax  25 mg was added at bedtime.  Tylenol  has also been used on as needed basis.  Serial check of labs showed decrease in CO2 levels therefore Topamax  was discontinued at discharge. Follow up CBC showed H/H to be stable on DOAC.  Her blood pressures were monitored on TID basis and has been stable.  She has been seizure-free during her stay.  P.o. intake has been good and she  is continent of bowel and bladder.  She did report inflammation of cyst in right groin that was tender to touch.  Doxycycline  and warm compresses were added to help with local measures.  Inflammation/fluctuance is almost resolved and she was advised to follow-up with GYN if symptoms do not fully improve.    Her diabetes has been monitored with ac/hs CBG checks and SSI was use prn for tighter BS control.  She continues on insulin  glargine which  was increased to 18 mg at discharge.  She was advised to monitor blood sugars 3-4 times a day and follow-up with set PCP for further adjustment in medications. Dr. Corina neuropsychologist was consulted to evaluate patient and felt that she was in a stable mood state-not hypomanic or manic and had no indications of risk to harm self or others.  She has been educated on importance of tobacco and marijuana cessation.  Appointment has been set up with [facility] care for posthospital follow-up.  She has made good gains during her stay and is at modified independent level for mobility.  Supervision is recommended with cognitive tasks. Due to lack of insurance, patient was given information on pro bono clinic to follow-up outpatient therapy.  She was also given 30-day supply of medications per match and has been given information on [facility] behavioral health to follow-up for treatment of her bipolar/schizoaffective disorder.   Rehab course: During patient's stay in rehab weekly team conferences were held to monitor patient's progress, set goals and discuss barriers to discharge. At admission, patient required contact-guard assist with basic ADL tasks and with mobility. She exhibited moderate cognitive deficits affecting function, sequencing, reasoning as well as mild impulsivity. She  has had improvement in activity tolerance, balance, postural control as well as ability to compensate for deficits. She has had improvement in functional use LUE  and LLE as well as improvement in awareness. She is able to complete ADL tasks at modified independent level.  She is independent for transfers and is able to ambulate with straight cane. She requires supervision overall for basic cognitive tasks, problem-solving, sustain attention and awareness of deficits.  Family education has been completed.    Disposition: Home  Diet: Heart healthy/carb modified  Special Instructions: Monitor blood sugars AC/at  bedtime and follow-up with PCP for adjustment of insulin  Recommend repeat check be made to monitor CO2 levels. 3.   Recommend follow-up with behavioral health to manage anxiety and mood 4.  CTA head/neck recommended in 2 to 3 months as follow-up to monitor for resolution of thrombus.     Discharge Instructions     Ambulatory referral to Neurology   Complete by: As directed    An appointment is requested in approximately: 4-6 weeks   Ambulatory referral to Physical Medicine Rehab   Complete by: As directed    Hospital follow up      Allergies as of 04/07/2024       Reactions   Dye Fdc Blue [brilliant Blue Fcf (fd&c Blue #1)] Hives   MRI   Other Nausea And Vomiting   Zole (antibiotics ending with zole)        Medication List     STOP taking these medications    insulin  aspart 100 UNIT/ML injection Commonly known as: novoLOG    insulin  glargine-yfgn 100 UNIT/ML injection Commonly known as: SEMGLEE        TAKE these medications    acetaminophen  325 MG tablet Commonly known as: TYLENOL  Take 2 tablets (650 mg total) by  mouth every 6 (six) hours as needed for headache.   apixaban  5 MG Tabs tablet Commonly known as: ELIQUIS  Take 1 tablet (5 mg total) by mouth 2 (two) times daily.   atorvastatin  80 MG tablet Commonly known as: LIPITOR  Take 1 tablet (80 mg total) by mouth daily.   busPIRone  5 MG tablet Commonly known as: BUSPAR  Take 1 tablet (5 mg total) by mouth 3 (three) times daily as needed (anxiety).   doxycycline  100 MG tablet Commonly known as: VIBRA -TABS Take 1 tablet (100 mg total) by mouth every 12 (twelve) hours.   Lantus  SoloStar 100 UNIT/ML Solostar Pen Generic drug: insulin  glargine Inject 18 Units into the skin 2 (two) times daily.   levETIRAcetam  1000 MG tablet Commonly known as: KEPPRA  Take 1 tablet (1,000 mg total) by mouth 2 (two) times daily.   polyethylene glycol powder 17 GM/SCOOP powder Commonly known as: MiraLax  Take 17 g by mouth  2 (two) times daily as needed for moderate constipation.   saccharomyces boulardii 250 MG capsule Commonly known as: FLORASTOR Take 1 capsule (250 mg total) by mouth 2 (two) times daily.   TechLite Pen Needles 32G X 4 MM Misc Generic drug: Insulin  Pen Needle Use 2 (two) times daily.        Follow-up Information     [facility] Follow up on 04/22/2024.   Why: Appointment @ 1:20 PM be there 15 mintues ahead Contact information: [address] [place] [address] [phone]         [facility] Follow up.   Why: office will call you with follow up appointment Contact information: [address]     [address] [place]  [address] [phone]        Carilyn Prentice BRAVO, MD Follow up.   Specialty: Physical Medicine and Rehabilitation Why: office will call you with follow up appointment Contact information: [address] [place] [address] [phone]                 Signed: Sharlet GORMAN Schmitz 04/07/2024, 10:37 AM

## 2024-04-21 ENCOUNTER — Other Ambulatory Visit (HOSPITAL_COMMUNITY): Payer: Self-pay

## 2024-04-22 ENCOUNTER — Encounter: Payer: Self-pay | Admitting: Adult Health

## 2024-04-22 NOTE — Progress Notes (Signed)
 This encounter was created in error - please disregard.

## 2024-04-29 ENCOUNTER — Encounter: Payer: MEDICAID | Attending: Registered Nurse | Admitting: Registered Nurse

## 2024-04-29 VITALS — BP 131/84 | HR 94 | Ht 62.0 in | Wt 141.0 lb

## 2024-04-29 DIAGNOSIS — I63511 Cerebral infarction due to unspecified occlusion or stenosis of right middle cerebral artery: Secondary | ICD-10-CM | POA: Insufficient documentation

## 2024-04-29 DIAGNOSIS — E7849 Other hyperlipidemia: Secondary | ICD-10-CM | POA: Diagnosis not present

## 2024-04-29 DIAGNOSIS — E119 Type 2 diabetes mellitus without complications: Secondary | ICD-10-CM | POA: Insufficient documentation

## 2024-04-29 DIAGNOSIS — Z794 Long term (current) use of insulin: Secondary | ICD-10-CM | POA: Insufficient documentation

## 2024-04-29 MED ORDER — APIXABAN 5 MG PO TABS: 5.0000 mg | ORAL_TABLET | Freq: Two times a day (BID) | ORAL | 0 refills | Status: DC

## 2024-04-29 MED ORDER — FREESTYLE LIBRE 3 READER DEVI: 1.0000 | Freq: Three times a day (TID) | 0 refills | Status: DC

## 2024-04-29 MED ORDER — ATORVASTATIN CALCIUM 80 MG PO TABS: 80.0000 mg | ORAL_TABLET | Freq: Every day | ORAL | 0 refills | Status: DC

## 2024-04-29 MED ORDER — LEVETIRACETAM 1000 MG PO TABS: 1000.0000 mg | ORAL_TABLET | Freq: Two times a day (BID) | ORAL | 0 refills | Status: DC

## 2024-04-29 MED ORDER — FREESTYLE LIBRE 3 SENSOR MISC: 1.0000 | 0 refills | Status: DC

## 2024-04-29 MED ORDER — BUSPIRONE HCL 5 MG PO TABS: 5.0000 mg | ORAL_TABLET | Freq: Three times a day (TID) | ORAL | 0 refills | Status: DC | PRN

## 2024-04-29 MED ORDER — LANTUS SOLOSTAR 100 UNIT/ML ~~LOC~~ SOPN: 18.0000 [IU] | PEN_INJECTOR | Freq: Two times a day (BID) | SUBCUTANEOUS | 0 refills | Status: DC

## 2024-04-29 NOTE — Progress Notes (Signed)
 Subjective:    Patient ID: Terri [name], female    DOB: Nov 24, 1975, 48 y.o.   MRN: [id number]  HPI: Terri [name] is a 48 y.o. female who is here for HFU appointment for follow up of her  Acute Right MCA Stroke, Hyperlipidemia and Diabetes Mellitus Type 2 , insulin  dependant. She presented to [name] ED on 03/28/2024 with complaints of weakness and AMS.  [name], CVP- [facility] H&P 03/28/2024 48 year old female with PMH as below, which is significant for DVT, Bipolar, Schizoaffective disorder, and migraines presented to [facility] ED 8/4 with complaints of weakness and AMS. Onset of symptoms was approximately 8/3 around 2100. There were also witnessed episodes of seizure like activity (body tightening and arms jumping). Upon arrival to the ED she was found to have flaccid paralysis of the left upper and lower extremities. She was treated as a Code Stroke and was taken immediately to the CT scanner, which showed now LVO, but was positive for acute thrombus in both common carotid arteries as well as enhancement in the right cerebral hemisphere concerning for subacute infarcts. Petechial hemorrhage also seen. She was evaluated by neurology and had a witnessed seizure which was successfully abated with IV ativan . Heparin  infusion started by neurology after weighing risks considering petechial hemorrhage. PCCM asked to evaluate for ICU admission.   CT Head: WO Contrast: IMPRESSION: Abnormal Right frontal lobe, with a combination of chronic posterior right MCA territory infarct (chronic encephalomalacia), but superimposed more anterior middle frontal gyrus 4 cm area of abnormal mixed density. And top differential considerations are recurrent right MCA infarct with hyperdense petechial hemorrhage, versus tumor with vasogenic edema.   CTA is pending and delayed postcontrast images have been requested on that exam.   Otherwise brain MRI without and with contrast would  best characterize further.  CTA:  IMPRESSION: 1. Constellation of acute thrombus in BOTH common carotid arteries (large volume on the Right), But no intracranial LVO. And multifocal abnormal enhancement in the right cerebral hemisphere - which is not entirely specific - but suggestive of enhancing subacute infarcts. Underlying chronic posterior right MCA territory infarct. And noncontrast Head CT suggesting petechial hemorrhage in the presume subacute right middle frontal gyrus area.   2. Differential diagnosis discussion by telephone with Dr. Aisha Seals including septic emboli (such as Endocarditis related), hypercoagulable state from malignancy, versus congenital or other benign acquired coagulopathy. Also, he advises history of DVT, so large paradoxical emboli such as from cardiac septal defect are a consideration.   3. Negative CTP. Negative visible nonvascular spaces of the neck and upper chest.  MR: Brain: IMPRESSION: 1. Multiple foci of T2 hyperintensity and cortical enhancement in the right MCA territory with associated petechial hemorrhage and some restricted diffusion, consistent with subacute infarcts. The lack of restricted diffusion and extensive enhancement suggests these infarctions are greater than 48 hours old. 2. More remote infarct and Encephalomalacia of the right parietal lobe. 3. No midline shift.  Neurology consulted.   [name] was admitted to inpatient rehabilitation on 03/31/2024 and she was discharged on 04/07/2024. Referral placed with [facility].  She denies any pain at this time. She rated her pain 5 on Health and History form. Also reports she has a good appetite.    This provider called [facility] to establish HFU appointment.     Pain Inventory Average Pain 4 Pain Right Now 5 My pain is   LOCATION OF PAIN  head, neck, wrist, hand,  fingers, back, hip, thigh, leg, ankle, toes  BOWEL Number of  stools per week: 3-4 Oral laxative use Yes  Type of laxative Miralax  Enema or suppository use No  History of colostomy No  Incontinent No   BLADDER Normal In and out cath, frequency . Able to self cath  Bladder incontinence No  Frequent urination Yes  Leakage with coughing No  Difficulty starting stream No  Incomplete bladder emptying No    Mobility walk without assistance how many minutes can you walk? Not sure ability to climb steps?  yes do you drive?  no  Function not employed: date last employed . I need assistance with the following:  meal prep, household duties, and    Neuro/Psych numbness tingling dizziness confusion anxiety  Prior Studies Hospital f/u  Physicians involved in your care Hospital f/u   Family History  Problem Relation Age of Onset   Hyperlipidemia Mother    Diabetes Mother    Diabetes Father    Hypertension Father    Heart disease Father 14       CABG   Kidney disease Father    Prostate cancer Father    Social History   Socioeconomic History   Marital status: Single    Spouse name: Not on file   Number of children: Not on file   Years of education: Not on file   Highest education level: Some college, no degree  Occupational History   Not on file  Tobacco Use   Smoking status: Some Days    Current packs/day: 0.50    Average packs/day: 0.5 packs/day for 15.0 years (7.5 ttl pk-yrs)    Types: Cigarettes   Smokeless tobacco: Never  Vaping Use   Vaping status: Never Used  Substance and Sexual Activity   Alcohol use: Yes   Drug use: Yes    Types: Marijuana    Comment: undetermined amount   Sexual activity: Never    Birth control/protection: None  Other Topics Concern   Not on file  Social History Narrative   Not on file   Social Drivers of Health   Financial Resource Strain: Not on file  Food Insecurity: Food Insecurity Present (04/22/2024)   Hunger Vital Sign    Worried About Running Out of Food in the Last Year:  Sometimes true    Ran Out of Food in the Last Year: Not on file  Transportation Needs: No Transportation Needs (04/22/2024)   PRAPARE - Administrator, Civil Service (Medical): No    Lack of Transportation (Non-Medical): No  Physical Activity: Not on file  Stress: Not on file  Social Connections: Unknown (04/22/2024)   Social Connection and Isolation Panel    Frequency of Communication with Friends and Family: More than three times a week    Frequency of Social Gatherings with Friends and Family: Not on file    Attends Religious Services: Not on file    Active Member of Clubs or Organizations: No    Attends Banker Meetings: Not on file    Marital Status: Not on file   Past Surgical History:  Procedure Laterality Date   APPENDECTOMY     BOWEL RESECTION     r/t endometriosis   OVARIAN CYST REMOVAL     bilateral   TRANSESOPHAGEAL ECHOCARDIOGRAM (CATH LAB) N/A 03/30/2024   Procedure: TRANSESOPHAGEAL ECHOCARDIOGRAM;  Surgeon: Barbaraann Darryle Ned, MD;  Location: [facility] INVASIVE CV LAB;  Service: Cardiovascular;  Laterality: N/A;   Past Medical History:  Diagnosis Date   Allergies    nasal congestion/eyes get itchy   DEPRESSION 10/18/2009   DVT (deep vein thrombosis) in pregnancy 2012   In setting of tobacco use/OCP   ENDOMETRIOSIS 10/18/2009   GERD 10/18/2009   History of HPV infection    MIGRAINE HEADACHE 10/18/2009   UNSPECIFIED PERIPHERAL VASCULAR DISEASE 10/18/2009   embolization to right 2nd and 3rd finger   LMP  (LMP Unknown)   Opioid Risk Score:   Fall Risk Score:  `1  Depression screen PHQ 2/9      No data to display           Review of Systems  Gastrointestinal:  Positive for abdominal pain and nausea.  Musculoskeletal:  Positive for back pain, myalgias and neck pain.  Neurological:  Positive for dizziness and numbness.  Psychiatric/Behavioral:  Positive for confusion. The patient is nervous/anxious.   All other systems reviewed and  are negative.      Objective:   Physical Exam Vitals and nursing note reviewed.  Constitutional:      Appearance: Normal appearance.  Cardiovascular:     Rate and Rhythm: Normal rate and regular rhythm.  Pulmonary:     Effort: Pulmonary effort is normal.     Breath sounds: Normal breath sounds.  Musculoskeletal:     Comments: Normal Muscle Bulk and Muscle Testing Reveals:  Upper Extremities:Full  ROM and Muscle Strength on right 5/5 and Left 4/5 Lower Extremities: Full ROM and Muscle Strength 5/5 Arises from Table slowly Narrow Based  Gait     Skin:    General: Skin is warm and dry.  Neurological:     Mental Status: She is alert and oriented to person, place, and time.  Psychiatric:        Mood and Affect: Mood normal.        Behavior: Behavior normal.           Assessment & Plan:  Acute Right MCA Stroke: [facility] was called and she has a scheduled appointment. Continue current medication regimen.   Hyperlipidemia: Continue current medication regimen, she has a scheduled PCP appointment.  Diabetes Mellitus Type 2 , insulin  dependant: Continue current medication regimen. She has a scheduled PCP appointment.   F/U with Dr. Carilyn in 4- 6 weeks

## 2024-05-02 ENCOUNTER — Telehealth: Payer: Self-pay

## 2024-05-03 NOTE — Telephone Encounter (Signed)
 Approved today by [facility] Medicaid 2017 Approved. This drug has been approved. Approved quantity: 15 milliliters per 42 day(s). You may fill up to a 34 day supply at a retail pharmacy. You may fill up to a 90 day supply for maintenance drugs, please refer to the formulary for details. Please call the pharmacy to process your prescription claim. Effective Date: 05/03/2024 Authorization Expiration Date: 05/03/2025   Patient has been informed of the approval. A copy of the approval has been faxed to the pharmacy. Patient has been advised to keep the PCP appointment and monitor CBG levels.

## 2024-05-04 ENCOUNTER — Telehealth: Payer: Self-pay

## 2024-05-04 NOTE — Telephone Encounter (Signed)
 Patient is requesting Rx Florastor to be refilled. Also she may need needles for the pen that was sent to the pharmacy yesterday.  Patient was advised to check with pharmacy to see what is included with the pen.

## 2024-05-04 NOTE — Telephone Encounter (Signed)
 Terri [name],  Did the patient call back regarding the needles? Can you ask her if she is having any GI symptoms,  such as gas, bloating and diarrhea) if not she doesn't need to continue th Florastor, I reached out to [place].

## 2024-05-09 ENCOUNTER — Encounter: Payer: Self-pay | Admitting: Registered Nurse

## 2024-05-20 ENCOUNTER — Ambulatory Visit: Admitting: Family

## 2024-05-20 ENCOUNTER — Encounter: Payer: Self-pay | Admitting: Family

## 2024-05-20 VITALS — BP 128/74 | HR 93 | Temp 97.6°F | Resp 19 | Ht 62.0 in | Wt 148.0 lb

## 2024-05-20 DIAGNOSIS — F411 Generalized anxiety disorder: Secondary | ICD-10-CM

## 2024-05-20 DIAGNOSIS — Z1211 Encounter for screening for malignant neoplasm of colon: Secondary | ICD-10-CM

## 2024-05-20 DIAGNOSIS — Z87891 Personal history of nicotine dependence: Secondary | ICD-10-CM

## 2024-05-20 DIAGNOSIS — E1165 Type 2 diabetes mellitus with hyperglycemia: Secondary | ICD-10-CM

## 2024-05-20 DIAGNOSIS — E785 Hyperlipidemia, unspecified: Secondary | ICD-10-CM

## 2024-05-20 DIAGNOSIS — Z23 Encounter for immunization: Secondary | ICD-10-CM

## 2024-05-20 DIAGNOSIS — L732 Hidradenitis suppurativa: Secondary | ICD-10-CM

## 2024-05-20 DIAGNOSIS — G43009 Migraine without aura, not intractable, without status migrainosus: Secondary | ICD-10-CM | POA: Diagnosis not present

## 2024-05-20 DIAGNOSIS — Z1231 Encounter for screening mammogram for malignant neoplasm of breast: Secondary | ICD-10-CM

## 2024-05-20 DIAGNOSIS — Z8673 Personal history of transient ischemic attack (TIA), and cerebral infarction without residual deficits: Secondary | ICD-10-CM

## 2024-05-20 DIAGNOSIS — R569 Unspecified convulsions: Secondary | ICD-10-CM

## 2024-05-20 DIAGNOSIS — K219 Gastro-esophageal reflux disease without esophagitis: Secondary | ICD-10-CM

## 2024-05-20 DIAGNOSIS — F3160 Bipolar disorder, current episode mixed, unspecified: Secondary | ICD-10-CM | POA: Diagnosis not present

## 2024-05-20 DIAGNOSIS — H6123 Impacted cerumen, bilateral: Secondary | ICD-10-CM

## 2024-05-20 DIAGNOSIS — N951 Menopausal and female climacteric states: Secondary | ICD-10-CM

## 2024-05-20 DIAGNOSIS — K5901 Slow transit constipation: Secondary | ICD-10-CM

## 2024-05-20 MED ORDER — FREESTYLE LIBRE 3 READER DEVI: 1.0000 | Freq: Three times a day (TID) | 0 refills | Status: DC

## 2024-05-20 MED ORDER — DEBROX 6.5 % OT SOLN: 5.0000 [drp] | Freq: Two times a day (BID) | OTIC | 0 refills | Status: AC

## 2024-05-20 MED ORDER — BUSPIRONE HCL 5 MG PO TABS: 5.0000 mg | ORAL_TABLET | Freq: Three times a day (TID) | ORAL | 3 refills | Status: DC | PRN

## 2024-05-20 MED ORDER — FREESTYLE LIBRE 3 SENSOR MISC: 1.0000 | 0 refills | Status: DC

## 2024-05-20 MED ORDER — SACCHAROMYCES BOULARDII 250 MG PO CAPS: 250.0000 mg | ORAL_CAPSULE | Freq: Two times a day (BID) | ORAL | 3 refills | Status: AC

## 2024-05-20 MED ORDER — APIXABAN 5 MG PO TABS: 5.0000 mg | ORAL_TABLET | Freq: Two times a day (BID) | ORAL | 3 refills | Status: AC

## 2024-05-20 MED ORDER — DOXYCYCLINE HYCLATE 100 MG PO TABS: 100.0000 mg | ORAL_TABLET | Freq: Two times a day (BID) | ORAL | 3 refills | Status: DC

## 2024-05-20 MED ORDER — INSULIN PEN NEEDLE 32G X 4 MM MISC
1.0000 | Freq: Two times a day (BID) | 0 refills | Status: DC
Start: 2024-05-20 — End: 2024-06-09

## 2024-05-20 MED ORDER — ATORVASTATIN CALCIUM 80 MG PO TABS: 80.0000 mg | ORAL_TABLET | Freq: Every day | ORAL | 3 refills | Status: AC

## 2024-05-20 NOTE — Patient Instructions (Signed)
 Please record blood sugar on log twice daily then bring log to visit in 2 weeks.

## 2024-05-20 NOTE — Progress Notes (Signed)
 Provider: Roxan Plough FNP-C   Shay Bartoli, Roxan BROCKS, NP  Patient Care Team: Alira Fretwell, Roxan BROCKS, NP as PCP - General (Family Medicine)  Extended Emergency Contact Information Primary Emergency Contact: Avina,Carolyn Address: [address]          [place], [place] [address] [place] Home Phone: [phone] Mobile Phone: [phone] Relation: Mother  Code Status:  Full Code  Goals of care: Advanced Directive information    05/20/2024   12:57 PM  Advanced Directives  Does Patient Have a Medical Advance Directive? No  Would patient like information on creating a medical advance directive? No - Patient declined     Chief Complaint  Patient presents with   Establish Care    New patient appointment.    Discussed the use of AI scribe software for clinical note transcription with the patient, who gave verbal consent to proceed.  History of Present Illness   Terri [name] is a 48 year old female with uncontrolled diabetes and recent stroke who presents to Establish care for a primary care visit.  She was hospitalized last month for a middle cerebral artery (MCA) stroke caused by an embolic blood clot. She experiences left-sided weakness affecting both her hand and leg. She has no issues with swallowing or speech changes, but her vision is weaker, particularly on the left side, and she has not yet seen an eye doctor.  She has a history of uncontrolled diabetes, diagnosed during her recent hospitalization, with an A1c of 13.7 and blood sugars running in the 200s to 300s. She uses a Therapist, art reader for glucose monitoring but needs more sensors. She is on Lantus , taking 18 units twice daily, and is out of some medications including Buspar  and probiotics.  She has hyperlipidemia with a total cholesterol of 233 and triglycerides at 425. She reports eating a lot of carbohydrates and sweets. She is on atorvastatin  80 mg for cholesterol management.  Her past medical  history includes anxiety, mixed bipolar disorder, gastroesophageal reflux disease, and a history of migraines, which she has not experienced recently. She also reports a history of peripheral vascular disease, with leg pain described as a 'baloney feeling'. She had a DVT in 2010 or 2012 on her left leg, and her right leg currently hurts. She has a history of smoking for about ten years but quit before her recent hospitalization.  She is taking Tylenol  650 mg every six hours, Miralax  as needed, and was recently on doxycycline  for a boil on her inner thigh, which has mostly healed. She is also on Eliquis  and Keppra  1000 mg twice daily for seizures, which started after her stroke. She denies any current alcohol use and has not smoked marijuana recently.    Past Medical History:  Diagnosis Date   Allergies    nasal congestion/eyes get itchy   DEPRESSION 10/18/2009   DVT (deep vein thrombosis) in pregnancy 2012   In setting of tobacco use/OCP   ENDOMETRIOSIS 10/18/2009   GERD 10/18/2009   History of HPV infection    MIGRAINE HEADACHE 10/18/2009   UNSPECIFIED PERIPHERAL VASCULAR DISEASE 10/18/2009   embolization to right 2nd and 3rd finger   Past Surgical History:  Procedure Laterality Date   APPENDECTOMY     BOWEL RESECTION     r/t endometriosis   OVARIAN CYST REMOVAL     bilateral   TRANSESOPHAGEAL ECHOCARDIOGRAM (CATH LAB) N/A 03/30/2024   Procedure: TRANSESOPHAGEAL ECHOCARDIOGRAM;  Surgeon: Barbaraann Darryle Ned, MD;  Location: [facility] INVASIVE CV  LAB;  Service: Cardiovascular;  Laterality: N/A;    Allergies  Allergen Reactions   Dye Fdc Blue [Brilliant Blue Fcf (Fd&C Blue #1)] Hives    MRI   Other Nausea And Vomiting    Zole (antibiotics ending with zole)    Allergies as of 05/20/2024       Reactions   Dye Fdc Blue [brilliant Blue Fcf (fd&c Blue #1)] Hives   MRI   Other Nausea And Vomiting   Zole (antibiotics ending with zole)        Medication List        Accurate as of  May 20, 2024  4:14 PM. If you have any questions, ask your nurse or doctor.          STOP taking these medications    Blood Glucose Monitoring Suppl Devi Stopped by: Roxan BROCKS Marthella Osorno       TAKE these medications    acetaminophen  325 MG tablet Commonly known as: TYLENOL  Take 2 tablets (650 mg total) by mouth every 6 (six) hours as needed for headache.   apixaban  5 MG Tabs tablet Commonly known as: ELIQUIS  Take 1 tablet (5 mg total) by mouth 2 (two) times daily.   atorvastatin  80 MG tablet Commonly known as: LIPITOR  Take 1 tablet (80 mg total) by mouth daily.   busPIRone  5 MG tablet Commonly known as: BUSPAR  Take 1 tablet (5 mg total) by mouth 3 (three) times daily as needed (anxiety).   Debrox 6.5 % OTIC solution Generic drug: carbamide peroxide Place 5 drops into both ears 2 (two) times daily for 4 days. Started by: Cameshia Cressman C Dionisio Aragones   doxycycline  100 MG tablet Commonly known as: VIBRA -TABS Take 1 tablet (100 mg total) by mouth every 12 (twelve) hours.   FreeStyle Libre 3 Reader [place] Use as directed 4 (four) times daily -  before meals and at bedtime.   FreeStyle Libre 3 Sensor Misc Place 1 sensor on the skin every 14 days. Use to check glucose continuously   Insulin  Pen Needle 32G X 4 MM Misc Use 2 (two) times daily.   Lantus  SoloStar 100 UNIT/ML Solostar Pen Generic drug: insulin  glargine Inject 18 Units into the skin 2 (two) times daily.   levETIRAcetam  1000 MG tablet Commonly known as: KEPPRA  Take 1 tablet (1,000 mg total) by mouth 2 (two) times daily.   polyethylene glycol powder 17 GM/SCOOP powder Commonly known as: MiraLax  Take 17 g by mouth 2 (two) times daily as needed for moderate constipation.   saccharomyces boulardii 250 MG capsule Commonly known as: FLORASTOR Take 1 capsule (250 mg total) by mouth 2 (two) times daily.        Review of Systems  Constitutional:  Negative for appetite change, chills, fatigue, fever and unexpected  weight change.  HENT:  Negative for congestion, dental problem, ear discharge, ear pain, facial swelling, hearing loss, nosebleeds, postnasal drip, rhinorrhea, sinus pressure, sinus pain, sneezing, sore throat, tinnitus and trouble swallowing.   Eyes:  Negative for pain, discharge, redness, itching and visual disturbance.  Respiratory:  Negative for cough, chest tightness, shortness of breath and wheezing.   Cardiovascular:  Negative for chest pain, palpitations and leg swelling.  Gastrointestinal:  Positive for constipation. Negative for abdominal distention, abdominal pain, blood in stool, diarrhea, nausea and vomiting.  Endocrine: Negative for cold intolerance, heat intolerance, polydipsia, polyphagia and polyuria.  Genitourinary:  Negative for difficulty urinating, dysuria, flank pain, frequency and urgency.       Menopause  Musculoskeletal:  Negative for arthralgias, back pain, gait problem, joint swelling, myalgias, neck pain and neck stiffness.  Skin:  Negative for color change, pallor, rash and wound.  Neurological:  Positive for weakness. Negative for dizziness, syncope, speech difficulty, light-headedness, numbness and headaches.  Hematological:  Does not bruise/bleed easily.  Psychiatric/Behavioral:  Negative for agitation, behavioral problems, confusion, hallucinations, self-injury, sleep disturbance and suicidal ideas. The patient is nervous/anxious.     Immunization History  Administered Date(s) Administered   INFLUENZA, HIGH DOSE SEASONAL PF 05/20/2024   Influenza,inj,Quad PF,6+ Mos 07/12/2019   PNEUMOCOCCAL CONJUGATE-20 05/20/2024   Pneumococcal Polysaccharide-23 08/26/2007, 07/12/2019   Td 08/25/1998   Tdap 07/28/2011   Pertinent  Health Maintenance Due  Topic Date Due   FOOT EXAM  Never done   OPHTHALMOLOGY EXAM  Never done   Mammogram  Never done   Colonoscopy  Never done   HEMOGLOBIN A1C  09/29/2024   Influenza Vaccine  Completed      07/10/2019   11:00 PM  07/11/2019    8:35 AM 07/12/2019   12:10 AM 07/12/2019    9:00 AM 04/29/2024    1:31 PM  Fall Risk  Falls in the past year?     1  Was there an injury with Fall?     1  Was there an injury with Fall? - Comments     bruises  Fall Risk Category Calculator     3  (RETIRED) Patient Fall Risk Level          Information is confidential and restricted. Go to Review Flowsheets to unlock data.   Functional Status Survey:    Vitals:   05/20/24 1301  BP: 128/74  Pulse: 93  Resp: 19  Temp: 97.6 F (36.4 C)  SpO2: 98%  Weight: 148 lb (67.1 kg)  Height: 5' 2 (1.575 m)   Body mass index is 27.07 kg/m. Physical Exam Physical Exam   GENERAL: Alert, cooperative, well developed, no acute distress. HEENT: Normocephalic, normal oropharynx, moist mucous membranes. Cerumen impaction in left ear, partial in right ear. Mild tenderness over frontal sinuses. NECK: Thyroid normal. CHEST: Clear to auscultation bilaterally. No wheezes, rhonchi, or crackles. CARDIOVASCULAR: Normal heart rate and rhythm, S1 and S2 normal without murmurs. ABDOMEN: Soft, mild tenderness, non-distended, without organomegaly. Normal bowel sounds. EXTREMITIES: No cyanosis or edema. Spider veins on right foot near ankle. Calf muscle soreness. NEUROLOGICAL: Cranial nerves grossly intact. Moves all extremities with slight weakness on left side      Labs reviewed: Recent Labs    03/29/24 0718 03/30/24 0453 03/31/24 0337 04/01/24 0527 04/04/24 0534 04/07/24 0435  NA 136 137 138 140 138 138  K 3.2* 4.2 4.5 4.7 4.0 3.5  CL 104 108 106 107 111 112*  CO2 24 21* 24 20* 19* 18*  GLUCOSE 238* 212* 147* 214* 152* 149*  BUN <5* <5* 5* 7 9 10   CREATININE 0.49 0.65 0.67 0.64 0.68 0.64  CALCIUM  8.8* 8.8* 8.8* 9.1 9.0 9.2  MG 1.7 2.0 1.9  --   --   --   PHOS 4.4 4.3 5.1*  --   --   --    Recent Labs    03/28/24 0524 03/30/24 0453 03/31/24 0337 04/01/24 0527  AST 22 17  --  21  ALT 10 12  --  12  ALKPHOS 93 67  --  68   BILITOT 0.7 0.6  --  0.9  PROT 6.8 6.2*  --  6.2*  ALBUMIN 3.2* 2.7* 2.7* 2.8*  Recent Labs    03/28/24 0538 03/28/24 0546 03/30/24 0453 04/01/24 0527 04/04/24 0534  WBC 14.0*   < > 9.4 7.4 8.6  NEUTROABS 9.6*  --   --  4.2  --   HGB 13.7   < > 12.9 13.2 13.4  HCT 40.9   < > 38.5 40.6 39.6  MCV 88.1   < > 86.9 88.8 88.0  PLT 341   < > 324 320 323   < > = values in this interval not displayed.   Lab Results  Component Value Date   TSH 1.324 07/09/2019   Lab Results  Component Value Date   HGBA1C 13.7 (H) 03/29/2024   Lab Results  Component Value Date   CHOL 233 (H) 03/29/2024   HDL 38 (L) 03/29/2024   LDLCALC UNABLE TO CALCULATE IF TRIGLYCERIDE OVER 400 mg/dL 91/94/7974   LDLDIRECT 132 (H) 03/29/2024   TRIG 425 (H) 03/29/2024   CHOLHDL 6.1 03/29/2024    Significant Diagnostic Results in last 30 days:  No results found.  Assessment/Plan  Type 2 diabetes mellitus, uncontrolled Recently diagnosed with uncontrolled type 2 diabetes mellitus following a stroke. Hemoglobin A1c is significantly elevated at 13.7%, indicating poor glycemic control. Blood glucose levels are likely in the 200-300 mg/dL range. No new symptoms of neuropathy reported. She is motivated to manage her diet independently without a nutritionist referral. - Prescribe Libre 3 sensors for continuous glucose monitoring. - Instruct to check blood sugars for the next two weeks and bring readings to follow-up. - Continue Lantus  18 units twice daily. - Discuss dietary modifications to reduce carbohydrate intake. - Schedule follow-up in two weeks to review blood sugar logs.  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 No speech or swallowing difficulties reported. Scheduled follow-up with neurologist in December. - Continue Eliquis  indefinitely for stroke prevention. -  Encourage follow-up with neurologist in December. - Refer to physical and occupational therapy for rehabilitation.  Epileptic seizures, post-stroke Recent onset of seizures post-stroke, currently managed with Keppra  1000 mg twice daily. No specific type of seizures identified. - Continue Keppra  1000 mg twice daily.  Hyperlipidemia with elevated triglycerides and low HDL Hyperlipidemia with significantly elevated triglycerides at 425 mg/dL and low HDL. Dietary factors contributing to dyslipidemia. Discussed the importance of dietary changes to manage cholesterol levels and prevent further cardiovascular events. - Continue atorvastatin  80 mg daily. - Advise dietary modifications to reduce intake of saturated fats and carbohydrates. - Encourage regular exercise to increase HDL levels.  Peripheral vascular disease Peripheral vascular disease with symptoms of leg pain and previous DVT in the left leg. Smoking history noted, but she has quit smoking. No recent ultrasound of the legs available. - Encourage continued smoking cessation. - Monitor for symptoms of worsening circulation.  Bipolar disorder, mixed type Mixed bipolar disorder, currently not under psychiatric care. She reports taking medications regularly.  Generalized anxiety disorder Generalized anxiety disorder, managed with Buspar . She reports running out of medication.  Gastroesophageal reflux disease (GERD) GERD with symptoms potentially exacerbated by dietary choices.  Constipation, intermittent Intermittent constipation managed with Miralax  as needed. - Continue Miralax  as needed for constipation.  Cerumen impaction, bilateral Bilateral cerumen impaction noted during examination. She was advised on over-the-counter treatment options. - Recommend over-the-counter ear drops (Debrox) for cerumen softening. - Schedule follow-up for ear flushing after four days of treatment.  Hydradenitis  - Has improved on Doxycycline   Menopausal state She reports being in a meno pausal state with no menstrual period for over a year.   Family/ staff Communication: Reviewed plan of care with patient verbalized understanding   Labs/tests ordered:  - Urine microalbumin   Next Appointment : Return in about 2 weeks (around 06/03/2024) for Type 2 Diabetes, pap smear and , Bilateral ear lavage.   Spent 45 minutes of Face to face and non-face to face with patient  >50% time spent counseling; reviewing medical record; tests; labs; documentation and developing future plan of care.   Roxan JAYSON Plough, NP

## 2024-05-21 LAB — MICROALBUMIN / CREATININE URINE RATIO
Creatinine, Urine: 104 mg/dL (ref 20–275)
Microalb Creat Ratio: 122 mg/g{creat} — ABNORMAL HIGH (ref <30)
Microalb, Ur: 12.7 mg/dL

## 2024-05-23 ENCOUNTER — Telehealth: Payer: Self-pay

## 2024-05-23 NOTE — Telephone Encounter (Signed)
   I called patients pharmacy and left a detailed voicemail notifying them of prior authorization approval for both the reader and sensor.

## 2024-05-23 NOTE — Telephone Encounter (Signed)
 Incoming fax received from patients pharmacy to initiate a prior authorization for                   .  PA initiated through covermymeds. Key: [id number]  The following message populated:      I called the pharmacy to see if PA was still needed and was told yes. A new Key was then provided on Covermymeds  Key: [id number]  Waiting on response from insurance company (can take 24-72 hours)

## 2024-05-23 NOTE — Telephone Encounter (Signed)
 Another fax received for [facility] reader device                 .  PA initiated through covermymeds. Key: [id number]  Awaiting reply from the insurance company which will be determined in 48-72 hours.

## 2024-05-27 ENCOUNTER — Other Ambulatory Visit: Payer: Self-pay

## 2024-05-27 ENCOUNTER — Ambulatory Visit: Payer: Self-pay | Admitting: Family

## 2024-05-27 DIAGNOSIS — E1165 Type 2 diabetes mellitus with hyperglycemia: Secondary | ICD-10-CM

## 2024-05-27 MED ORDER — LOSARTAN POTASSIUM 25 MG PO TABS: 25.0000 mg | ORAL_TABLET | Freq: Every day | ORAL | 1 refills | Status: AC

## 2024-05-30 ENCOUNTER — Other Ambulatory Visit: Payer: Self-pay | Admitting: Family

## 2024-05-30 MED ORDER — LEVETIRACETAM 1000 MG PO TABS: 1000.0000 mg | ORAL_TABLET | Freq: Two times a day (BID) | ORAL | 0 refills | Status: DC

## 2024-05-30 MED ORDER — LANTUS SOLOSTAR 100 UNIT/ML ~~LOC~~ SOPN: 18.0000 [IU] | PEN_INJECTOR | Freq: Two times a day (BID) | SUBCUTANEOUS | 0 refills | Status: DC

## 2024-05-30 NOTE — Telephone Encounter (Signed)
 Copied from CRM [id number]. Topic: Clinical - Medication Refill >> May 30, 2024  9:37 AM Antonio H wrote: Medication: levETIRAcetam  (KEPPRA ) 1000 MG tablet insulin  glargine (LANTUS  SOLOSTAR) 100 UNIT/ML Solostar Pen Medications were given to patient at the hospital but she stated NP Dinah was going to refill them for her but they weren't sent to the pharmacy with all her other medications  Has the patient contacted their pharmacy? Yes (Agent: If no, request that the patient contact the pharmacy for the refill. If patient does not wish to contact the pharmacy document the reason why and proceed with request.) (Agent: If yes, when and what did the pharmacy advise?) Pharmacy advised patient that provider didn't send medications over with all the other medications   This is the patient's preferred pharmacy:   [id number] [place], [place] - [address] AT [facility] OF [address] & [facility] [address] [place] [address] Phone: [phone] Fax: [phone]  Is this the correct pharmacy for this prescription? Yes If no, delete pharmacy and type the correct one.   Has the prescription been filled recently? No  Is the patient out of the medication? No  Has the patient been seen for an appointment in the last year OR does the patient have an upcoming appointment? Yes  Can we respond through MyChart? Yes  Agent: Please be advised that Rx refills may take up to 3 business days. We ask that you follow-up with your pharmacy.

## 2024-05-31 ENCOUNTER — Encounter: Payer: Self-pay | Admitting: Physical Medicine & Rehabilitation

## 2024-05-31 ENCOUNTER — Encounter: Payer: MEDICAID | Attending: Physical Medicine & Rehabilitation | Admitting: Physical Medicine & Rehabilitation

## 2024-05-31 VITALS — BP 120/83 | HR 87 | Ht 62.0 in | Wt 144.0 lb

## 2024-05-31 DIAGNOSIS — Z8673 Personal history of transient ischemic attack (TIA), and cerebral infarction without residual deficits: Secondary | ICD-10-CM | POA: Insufficient documentation

## 2024-05-31 NOTE — Progress Notes (Signed)
 Subjective:    Patient ID: Terri [name], female    DOB: 1975-12-04, 48 y.o.   MRN: [id number] [facility] inpatient stay Admit date: 03/31/2024 Discharge date: 04/07/2024 49 y.o. female with history of DVT, bipolar disorder, migraines, depression, history of dizziness and weakness for a week who was admitted on 03/28/2024 with witnessed seizure type episode, weakness and acute mental status changes.  She was found to have flaccid left hemiplegia and CT head showed acute thrombus in both CCA and abnormal enhancement right hemisphere and petechial hemorrhage right middle frontal gyrus.  She was loaded with Keppra  and started on IV heparin .  Neurology recommended full workup to rule out cause of emboli and CT abdomen, pelvis and BLE done showing superficial femoral artery to be completely occluded, moderate calcific atherosclerosis within aortoiliac arteries, and groundglass opacity RLL and LLL.  CTA chest was negative for PE.  BLE Dopplers were negative for DVT.     TEE showed intrapulmonary shunt with EF of 65 to 70% and no thrombus.  MRI brain done on 08/05 revealing multiple foci of T2 hyperintensity and cortical enhancement in right MCA territory.  Autoimmune panel and hypercoagulopathy panel were negative.  She was transition to Eliquis  with recommendations to repeat CTA head/neck in 2 to 3 months for follow-up and decision to transition to antiplatelets if thrombus had resolved.  She continued to be limited by left-sided weakness with sensory deficits and required cues for high-level cognitive tasks.   HPI  Living with mom in family home  Mod I dressing and bathing   Amb with cane only outside her home   No falls  Able to make sandwich or snack but not a big meal  Denies swallow issues   Has some tingling pain in hands an dfeet but mainly mild, diabetic control not awful  currently doing a log with PCP Was driving prior to stroke and seizure , has not driven since.  Discussed with pt  state law no driving for at least 6 mo post seizure and needs to be cleared by neuro  Pain Inventory Average Pain 3 Pain Right Now 3 My pain is intermittent, sharp, burning, dull, tingling, and aching  In the last 24 hours, has pain interfered with the following? General activity 2 Relation with others 2 Enjoyment of life 2 What TIME of day is your pain at its worst? morning  and night Sleep (in general) Fair  Pain is worse with: inactivity Pain improves with: rest and medication Relief from Meds: 7  Family History  Problem Relation Age of Onset   Hyperlipidemia Mother    Diabetes Mother    Diabetes Father    Hypertension Father    Heart disease Father 33       CABG   Kidney disease Father    Prostate cancer Father    Social History   Socioeconomic History   Marital status: Single    Spouse name: Not on file   Number of children: Not on file   Years of education: Not on file   Highest education level: Some college, no degree  Occupational History   Not on file  Tobacco Use   Smoking status: Some Days    Current packs/day: 0.50    Average packs/day: 0.5 packs/day for 15.0 years (7.5 ttl pk-yrs)    Types: Cigarettes   Smokeless tobacco: Never  Vaping Use   Vaping status: Never Used  Substance and Sexual Activity   Alcohol use: Yes   Drug  use: Yes    Types: Marijuana    Comment: undetermined amount   Sexual activity: Never    Birth control/protection: None  Other Topics Concern   Not on file  Social History Narrative   Not on file   Social Drivers of Health   Financial Resource Strain: Not on file  Food Insecurity: Food Insecurity Present (04/22/2024)   Hunger Vital Sign    Worried About Running Out of Food in the Last Year: Sometimes true    Ran Out of Food in the Last Year: Not on file  Transportation Needs: No Transportation Needs (04/22/2024)   PRAPARE - Administrator, Civil Service (Medical): No    Lack of Transportation (Non-Medical):  No  Physical Activity: Not on file  Stress: Not on file  Social Connections: Unknown (04/22/2024)   Social Connection and Isolation Panel    Frequency of Communication with Friends and Family: More than three times a week    Frequency of Social Gatherings with Friends and Family: Not on file    Attends Religious Services: Not on file    Active Member of Clubs or Organizations: No    Attends Banker Meetings: Not on file    Marital Status: Not on file   Past Surgical History:  Procedure Laterality Date   APPENDECTOMY     BOWEL RESECTION     r/t endometriosis   OVARIAN CYST REMOVAL     bilateral   TRANSESOPHAGEAL ECHOCARDIOGRAM (CATH LAB) N/A 03/30/2024   Procedure: TRANSESOPHAGEAL ECHOCARDIOGRAM;  Surgeon: Barbaraann Darryle Ned, MD;  Location: [facility] INVASIVE CV LAB;  Service: Cardiovascular;  Laterality: N/A;   Past Surgical History:  Procedure Laterality Date   APPENDECTOMY     BOWEL RESECTION     r/t endometriosis   OVARIAN CYST REMOVAL     bilateral   TRANSESOPHAGEAL ECHOCARDIOGRAM (CATH LAB) N/A 03/30/2024   Procedure: TRANSESOPHAGEAL ECHOCARDIOGRAM;  Surgeon: Barbaraann Darryle Ned, MD;  Location: [facility] INVASIVE CV LAB;  Service: Cardiovascular;  Laterality: N/A;   Past Medical History:  Diagnosis Date   Allergies    nasal congestion/eyes get itchy   DEPRESSION 10/18/2009   DVT (deep vein thrombosis) in pregnancy 2012   In setting of tobacco use/OCP   ENDOMETRIOSIS 10/18/2009   GERD 10/18/2009   History of HPV infection    MIGRAINE HEADACHE 10/18/2009   UNSPECIFIED PERIPHERAL VASCULAR DISEASE 10/18/2009   embolization to right 2nd and 3rd finger   BP 120/83   Pulse 87   Ht 5' 2 (1.575 m)   Wt 144 lb (65.3 kg)   SpO2 97%   BMI 26.34 kg/m   Opioid Risk Score:   Fall Risk Score:  `1  Depression screen [facility] 2/9     04/29/2024    1:31 PM  Depression screen PHQ 2/9  Decreased Interest 1  Down, Depressed, Hopeless 1  PHQ - 2 Score 2  Altered sleeping 1   Tired, decreased energy 1  Change in appetite 1  Feeling bad or failure about yourself  1  Trouble concentrating 1  Moving slowly or fidgety/restless 0  Suicidal thoughts 0  PHQ-9 Score 7  Difficult doing work/chores Somewhat difficult      Review of Systems  Musculoskeletal:  Positive for neck pain.       B/L shoulder pain Fingers and toes pain  All other systems reviewed and are negative.      Objective:   Physical Exam Examination General no acute distress Mood and  affect flat but otherwise appropriate Motor strength is 5/5 bilateral deltoid bicep tricep grip as well as hip flexor knee extensor plantar flexors Sensation equal to light touch bilateral upper and lower limbs Ambulates without assistive device no evidence of drag and instability Speech without dysarthria or aphasia Muscle tone is normal no evidence of spasticity or dystonia        Assessment & Plan:  1.  History of right MCA infarct now functionally back to baseline except not driving.  Her driving is restricted due to state law following seizure.  She needs clearance by neurology.  She continues on her Keppra .  She has appointment with neurology in December. Reached out to neurology regarding timing of CTA head and neck.  This could be ordered at the neurology visit.  Patient tolerating Eliquis  for now. Patient will follow-up with primary care physician as well as neurology. No physical medicine rehab follow-up needed. Patient may be developing some diabetic neuropathy, neuro can order EMG if they feel this is necessary.

## 2024-05-31 NOTE — Patient Instructions (Addendum)
 No driving x 6 months and need clearance from Neurologist, Dr Rosemarie to resume not before Feb 2026  Follow up with primary MD for general medical care as well as neurology    No physical medicine follow up needed at this time   Congrats on quitting smoking !!!!

## 2024-06-06 ENCOUNTER — Ambulatory Visit: Payer: MEDICAID | Admitting: Family

## 2024-06-06 ENCOUNTER — Encounter: Payer: Self-pay | Admitting: Family

## 2024-06-06 VITALS — BP 124/80 | HR 86 | Temp 97.6°F | Resp 20 | Ht 62.0 in | Wt 151.6 lb

## 2024-06-06 DIAGNOSIS — L732 Hidradenitis suppurativa: Secondary | ICD-10-CM | POA: Diagnosis not present

## 2024-06-06 DIAGNOSIS — E1165 Type 2 diabetes mellitus with hyperglycemia: Secondary | ICD-10-CM

## 2024-06-06 DIAGNOSIS — Z124 Encounter for screening for malignant neoplasm of cervix: Secondary | ICD-10-CM

## 2024-06-06 DIAGNOSIS — Z23 Encounter for immunization: Secondary | ICD-10-CM | POA: Diagnosis not present

## 2024-06-06 DIAGNOSIS — Z01419 Encounter for gynecological examination (general) (routine) without abnormal findings: Secondary | ICD-10-CM

## 2024-06-06 MED ORDER — DOXYCYCLINE HYCLATE 100 MG PO TABS: 100.0000 mg | ORAL_TABLET | Freq: Two times a day (BID) | ORAL | 0 refills | Status: AC

## 2024-06-06 MED ORDER — INSULIN ASPART 100 UNIT/ML IJ SOLN: 8.0000 [IU] | Freq: Three times a day (TID) | INTRAMUSCULAR | 99 refills | Status: DC

## 2024-06-06 MED ORDER — FREESTYLE LIBRE 3 SENSOR MISC: 1.0000 | 0 refills | Status: DC

## 2024-06-06 MED ORDER — FREESTYLE LIBRE 3 READER DEVI: 1.0000 | Freq: Three times a day (TID) | 1 refills | Status: AC

## 2024-06-06 NOTE — Progress Notes (Signed)
 Provider: Roxan Plough FNP-C  Coleston Dirosa, Roxan BROCKS, NP  Patient Care Team: Sreshta Cressler, Roxan BROCKS, NP as PCP - General (Family Medicine)  Extended Emergency Contact Information Primary Emergency Contact: Morand,Carolyn Address: [address]          [place], [place] [address] [place] Home Phone: [phone] Mobile Phone: [phone] Relation: Mother Secondary Emergency Contact: [facility] Mobile Phone: [phone] Relation: Uncle Preferred language: Isadora Interpreter needed? No  Code Status:  Full Code  Goals of care: Advanced Directive information    05/20/2024   12:57 PM  Advanced Directives  Does Patient Have a Medical Advance Directive? No  Would patient like information on creating a medical advance directive? No - Patient declined     Chief Complaint  Patient presents with   Follow-up    2 Week follow up for diabetes,pap smear,& ear lavage.   Discussed the use of AI scribe software for clinical note transcription with the patient, who gave verbal consent to proceed.  History of Present Illness   Terri [name] is a 48 year old female with diabetes who presents for follow-up on blood sugar levels and bilateral ear lavage. Also, here for Pap smear. She is accompanied by her mother.  Her blood sugar levels have been fluctuating, with readings ranging from 171 to 291 mg/dL. The levels are particularly high before lunch and after dinner, while morning levels are around 170 mg/dL. She is currently using Lantus  at 18 units twice daily and has previously used other medications in the hospital, including a pill and another type of insulin .  She experiences a calling sound in her right ear despite using ear drops at home. No ear pain is present, but she has a little fever and sneezing. She has been exposed to her mother, who is coughing.  Her weight has increased to 151.6 pounds from 144 pounds, which she attributes to her body 'going down' before.  She is currently taking Tylenol , Eliquis  5 mg twice daily, atorvastatin  80 mg, Buspar  5 mg three times daily as needed, Lantus  18 units twice daily, Keppra  1000 mg twice daily, losartan 25 mg, and Miralax .  She received only three pills of antibiotics for an infection that started as a bump on her inner thigh and now includes a couple on each breast. The bump on her breast has drained some, but others remain. She has a history of hidradenitis, which is relevant to her current skin issues. She reports tenderness in her breasts and has an upcoming mammogram scheduled for November 7th.   Past Medical History:  Diagnosis Date   Allergies    nasal congestion/eyes get itchy   DEPRESSION 10/18/2009   DVT (deep vein thrombosis) in pregnancy 2012   In setting of tobacco use/OCP   ENDOMETRIOSIS 10/18/2009   GERD 10/18/2009   History of HPV infection    MIGRAINE HEADACHE 10/18/2009   UNSPECIFIED PERIPHERAL VASCULAR DISEASE 10/18/2009   embolization to right 2nd and 3rd finger   Past Surgical History:  Procedure Laterality Date   APPENDECTOMY     BOWEL RESECTION     r/t endometriosis   OVARIAN CYST REMOVAL     bilateral   TRANSESOPHAGEAL ECHOCARDIOGRAM (CATH LAB) N/A 03/30/2024   Procedure: TRANSESOPHAGEAL ECHOCARDIOGRAM;  Surgeon: Barbaraann Darryle Ned, MD;  Location: [facility] INVASIVE CV LAB;  Service: Cardiovascular;  Laterality: N/A;    Allergies  Allergen Reactions   Dye Fdc Blue [Brilliant Blue Fcf (Fd&C Blue #1)] Hives    MRI   Other Nausea  And Vomiting    Zole (antibiotics ending with zole)    Outpatient Encounter Medications as of 06/06/2024  Medication Sig   acetaminophen  (TYLENOL ) 325 MG tablet Take 2 tablets (650 mg total) by mouth every 6 (six) hours as needed for headache.   apixaban  (ELIQUIS ) 5 MG TABS tablet Take 1 tablet (5 mg total) by mouth 2 (two) times daily.   atorvastatin  (LIPITOR ) 80 MG tablet Take 1 tablet (80 mg total) by mouth daily.   busPIRone  (BUSPAR ) 5 MG tablet  Take 1 tablet (5 mg total) by mouth 3 (three) times daily as needed (anxiety).   insulin  aspart (NOVOLOG ) 100 UNIT/ML injection Inject 8 Units into the skin 3 (three) times daily before meals. For blood sugar greater than 150   insulin  glargine (LANTUS  SOLOSTAR) 100 UNIT/ML Solostar Pen Inject 18 Units into the skin 2 (two) times daily.   Insulin  Pen Needle 32G X 4 MM MISC Use 2 (two) times daily.   levETIRAcetam  (KEPPRA ) 1000 MG tablet Take 1 tablet (1,000 mg total) by mouth 2 (two) times daily.   losartan (COZAAR) 25 MG tablet Take 1 tablet (25 mg total) by mouth daily.   polyethylene glycol powder (MIRALAX ) 17 GM/SCOOP powder Take 17 g by mouth 2 (two) times daily as needed for moderate constipation.   saccharomyces boulardii (FLORASTOR) 250 MG capsule Take 1 capsule (250 mg total) by mouth 2 (two) times daily.   [DISCONTINUED] Continuous Glucose Receiver (FREESTYLE LIBRE 3 READER) DEVI Use as directed 4 (four) times daily -  before meals and at bedtime.   [DISCONTINUED] Continuous Glucose Sensor (FREESTYLE LIBRE 3 SENSOR) MISC Place 1 sensor on the skin every 14 days. Use to check glucose continuously   [DISCONTINUED] doxycycline  (VIBRA -TABS) 100 MG tablet Take 1 tablet (100 mg total) by mouth every 12 (twelve) hours.   Continuous Glucose Receiver (FREESTYLE LIBRE 3 READER) DEVI Use as directed 4 (four) times daily -  before meals and at bedtime.   Continuous Glucose Sensor (FREESTYLE LIBRE 3 SENSOR) MISC Place 1 sensor on the skin every 14 days. Use to check glucose continuously   doxycycline  (VIBRA -TABS) 100 MG tablet Take 1 tablet (100 mg total) by mouth every 12 (twelve) hours for 7 days.   [DISCONTINUED] doxycycline  (VIBRAMYCIN ) 100 MG capsule Take 100 mg by mouth every 12 (twelve) hours. (Patient not taking: Reported on 06/06/2024)   No facility-administered encounter medications on file as of 06/06/2024.    Review of Systems  Constitutional:  Negative for appetite change, chills,  fatigue, fever and unexpected weight change.  HENT:  Negative for congestion, dental problem, ear discharge, ear pain, facial swelling, hearing loss, nosebleeds, postnasal drip, rhinorrhea, sinus pressure, sinus pain, sneezing, sore throat, tinnitus and trouble swallowing.   Eyes:  Negative for pain, discharge, redness, itching and visual disturbance.  Respiratory:  Negative for cough, chest tightness, shortness of breath and wheezing.   Cardiovascular:  Negative for chest pain, palpitations and leg swelling.  Gastrointestinal:  Negative for abdominal distention, abdominal pain, constipation, diarrhea, nausea and vomiting.  Endocrine: Negative for cold intolerance, heat intolerance, polydipsia, polyphagia and polyuria.  Genitourinary:  Negative for difficulty urinating, dysuria, flank pain, frequency and urgency.  Musculoskeletal:  Negative for arthralgias, back pain, gait problem, joint swelling, myalgias, neck pain and neck stiffness.  Skin:  Negative for color change, pallor, rash and wound.  Neurological:  Negative for dizziness, syncope, speech difficulty, weakness, light-headedness, numbness and headaches.  Hematological:  Does not bruise/bleed easily.  Psychiatric/Behavioral:  Negative  for agitation, behavioral problems, confusion, hallucinations and sleep disturbance. The patient is not nervous/anxious.     Immunization History  Administered Date(s) Administered   INFLUENZA, HIGH DOSE SEASONAL PF 05/20/2024   Influenza,inj,Quad PF,6+ Mos 07/12/2019   PNEUMOCOCCAL CONJUGATE-20 05/20/2024   Pneumococcal Polysaccharide-23 08/26/2007, 07/12/2019   Td 08/25/1998   Tdap 07/28/2011, 06/06/2024   Pertinent  Health Maintenance Due  Topic Date Due   FOOT EXAM  Never done   OPHTHALMOLOGY EXAM  Never done   Mammogram  Never done   Colonoscopy  Never done   HEMOGLOBIN A1C  09/29/2024   Influenza Vaccine  Completed      07/12/2019   12:10 AM 07/12/2019    9:00 AM 04/29/2024    1:31 PM  05/31/2024    1:20 PM 06/06/2024   10:40 AM  Fall Risk  Falls in the past year?   1 0 0  Was there an injury with Fall?   1  0  Was there an injury with Fall? - Comments   bruises    Fall Risk Category Calculator   3  0  (RETIRED) Patient Fall Risk Level       Patient at Risk for Falls Due to     No Fall Risks  Fall risk Follow up     Falls evaluation completed     Information is confidential and restricted. Go to Review Flowsheets to unlock data.   Functional Status Survey:    Vitals:   06/06/24 1044  BP: 124/80  Pulse: 86  Resp: 20  Temp: 97.6 F (36.4 C)  SpO2: 99%  Weight: 151 lb 9.6 oz (68.8 kg)  Height: 5' 2 (1.575 m)   Body mass index is 27.73 kg/m. Physical Exam VITALS: T- 97.6, P- 86, BP- 124/80, SaO2- 99% MEASUREMENTS: Weight- 151.6. GENERAL: Alert, cooperative, well developed, no acute distress HEENT: Normocephalic, normal oropharynx, moist mucous membranes, ears normal CHEST: Clear to auscultation bilaterally, no wheezes, rhonchi, or crackles CARDIOVASCULAR: Normal heart rate and rhythm, S1 and S2 normal without murmurs BREAST: Breasts normal, tender ABDOMEN: Soft, non-tender, non-distended, without organomegaly, normal bowel sounds GENITOURINARY: Groin non-tender, vagina normal, cervix normal, ovaries normal EXTREMITIES: No cyanosis or edema NEUROLOGICAL: Cranial nerves grossly intact, moves all extremities without gross motor or sensory deficit      Labs reviewed: Recent Labs    03/29/24 0718 03/30/24 0453 03/31/24 0337 04/01/24 0527 04/04/24 0534 04/07/24 0435  NA 136 137 138 140 138 138  K 3.2* 4.2 4.5 4.7 4.0 3.5  CL 104 108 106 107 111 112*  CO2 24 21* 24 20* 19* 18*  GLUCOSE 238* 212* 147* 214* 152* 149*  BUN <5* <5* 5* 7 9 10   CREATININE 0.49 0.65 0.67 0.64 0.68 0.64  CALCIUM  8.8* 8.8* 8.8* 9.1 9.0 9.2  MG 1.7 2.0 1.9  --   --   --   PHOS 4.4 4.3 5.1*  --   --   --    Recent Labs    03/28/24 0524 03/30/24 0453 03/31/24 0337  04/01/24 0527  AST 22 17  --  21  ALT 10 12  --  12  ALKPHOS 93 67  --  68  BILITOT 0.7 0.6  --  0.9  PROT 6.8 6.2*  --  6.2*  ALBUMIN 3.2* 2.7* 2.7* 2.8*   Recent Labs    03/28/24 0538 03/28/24 0546 03/30/24 0453 04/01/24 0527 04/04/24 0534  WBC 14.0*   < > 9.4 7.4 8.6  NEUTROABS 9.6*  --   --  4.2  --   HGB 13.7   < > 12.9 13.2 13.4  HCT 40.9   < > 38.5 40.6 39.6  MCV 88.1   < > 86.9 88.8 88.0  PLT 341   < > 324 320 323   < > = values in this interval not displayed.   Lab Results  Component Value Date   TSH 1.324 07/09/2019   Lab Results  Component Value Date   HGBA1C 13.7 (H) 03/29/2024   Lab Results  Component Value Date   CHOL 233 (H) 03/29/2024   HDL 38 (L) 03/29/2024   LDLCALC UNABLE TO CALCULATE IF TRIGLYCERIDE OVER 400 mg/dL 91/94/7974   LDLDIRECT 132 (H) 03/29/2024   TRIG 425 (H) 03/29/2024   CHOLHDL 6.1 03/29/2024    Significant Diagnostic Results in last 30 days:  No results found.  Assessment/Plan  Well Woman Visit Routine well woman visit with breast tenderness, but no nodules or discharge Has upcoming screening mammogram. - Send Pap smear specimen for analysis. - Advise spotting may occur post-Pap smear. - Instruct to keep mammogram appointment on November 7th.  Type 2 diabetes mellitus with hyperglycemia Blood sugars elevated, ranging from 171 to 291 mg/dL. Current regimen includes Lantus  18 units twice daily. Discussed need for better control to prevent complications affecting eyes and heart. Insurance covers Novolog  insulin  pen. - Prescribe Novolog  insulin  pen, 8 units before meals if blood sugar is more than 150 mg/dL. - Continue Lantus  18 units twice daily. - Instruct to monitor blood sugar before meals and at bedtime. - Increase Lantus  to 20 units if blood sugars remain high. - Send prescription for [employer] sensor to pharmacy. - Schedule follow-up appointment in two weeks to reassess blood sugar control.  Hidradenitis  suppurativa Presence of bumps on inner thigh and breasts with some drainage lesions. Previous antibiotic course was insufficient due to insurance issues. - Prescribe doxycycline  for 7 days, 1 tablet in the morning and 1 in the evening.  Cerumen impaction, bilateral Cerumen impaction in both ears with persistent blockage in the right ear despite ear drops and flushing. - Advise continued use of ear drops in the right ear. - Reassess ear at next visit.  General Health Maintenance Discussed potential need for COVID vaccine. - Advise to request COVID vaccine at pharmacy. - Offer to send prescription for COVID vaccine if needed.   Family/ staff Communication: Reviewed plan of care with patient verbalized understanding   Labs/tests ordered: None   Next Appointment: Return in about 2 weeks (around 06/20/2024) for Type 2 diabetes .   Total time: minutes. Greater than 50% of total time spent doing patient education regarding T2DM,HTN, Hidradenitis,health maintenance including symptom/medication management.   Roxan JAYSON Plough, NP

## 2024-06-06 NOTE — Patient Instructions (Signed)
 1.Go to pharmacy to receive Covid vaccine.

## 2024-06-09 ENCOUNTER — Other Ambulatory Visit (HOSPITAL_COMMUNITY): Payer: Self-pay

## 2024-06-09 ENCOUNTER — Other Ambulatory Visit: Payer: Self-pay | Admitting: Family

## 2024-06-09 DIAGNOSIS — E1165 Type 2 diabetes mellitus with hyperglycemia: Secondary | ICD-10-CM

## 2024-06-09 MED ORDER — INSULIN PEN NEEDLE 32G X 4 MM MISC
1.0000 | Freq: Two times a day (BID) | 0 refills | Status: DC
  Filled 2024-06-09 (×2): qty 100, 30d supply, fill #0

## 2024-06-09 MED ORDER — NOVOLOG FLEXPEN 100 UNIT/ML ~~LOC~~ SOPN
8.0000 [IU] | PEN_INJECTOR | Freq: Three times a day (TID) | SUBCUTANEOUS | 11 refills | Status: DC
  Filled 2024-06-09: qty 15, 62d supply, fill #0
  Filled 2024-06-09: qty 15, fill #0

## 2024-06-09 MED ORDER — INSULIN SYRINGE-NEEDLE U-100 31G X 1/2" 0.3 ML MISC
1.0000 | Freq: Three times a day (TID) | 3 refills | Status: DC
  Filled 2024-06-09 – 2024-06-20 (×2): qty 100, fill #0

## 2024-06-09 NOTE — Telephone Encounter (Signed)
 Copied from CRM [id number]. Topic: Clinical - Prescription Issue >> Jun 09, 2024  8:16 AM Terri [name] wrote: Reason for CRM: insulin  aspart (NOVOLOG ) 100 UNIT/ML injection, patient states that she went to pick up her medication and wanted inform NP Ngetich it was in a vial instead of injections. Please call patient to advise Terri [name] at [phone],

## 2024-06-09 NOTE — Telephone Encounter (Signed)
 Please send Insulin  Pen and syringes as requested,

## 2024-06-09 NOTE — Telephone Encounter (Signed)
 Spoke to patient who stated that she have not received the insulin  pens and also need syringe for the vial that she have received. She stated that she need to call back when she start medication to be able to do the two week f/u to be rechecked. Please advise

## 2024-06-09 NOTE — Telephone Encounter (Signed)
 High Risk Warning Populated when attempting to refill, I will send to Provider for further review

## 2024-06-13 LAB — PAP IG W/ RFLX HPV ASCU

## 2024-06-14 ENCOUNTER — Other Ambulatory Visit: Payer: Self-pay | Admitting: Family

## 2024-06-14 DIAGNOSIS — L732 Hidradenitis suppurativa: Secondary | ICD-10-CM

## 2024-06-20 ENCOUNTER — Other Ambulatory Visit (HOSPITAL_COMMUNITY): Payer: Self-pay

## 2024-06-20 ENCOUNTER — Other Ambulatory Visit: Payer: Self-pay

## 2024-06-20 ENCOUNTER — Ambulatory Visit: Payer: MEDICAID | Admitting: Family

## 2024-06-20 ENCOUNTER — Other Ambulatory Visit (HOSPITAL_BASED_OUTPATIENT_CLINIC_OR_DEPARTMENT_OTHER): Payer: Self-pay

## 2024-06-21 ENCOUNTER — Other Ambulatory Visit (HOSPITAL_COMMUNITY): Payer: Self-pay

## 2024-06-25 ENCOUNTER — Other Ambulatory Visit: Payer: Self-pay | Admitting: Family

## 2024-06-27 ENCOUNTER — Encounter: Payer: Self-pay | Admitting: Family

## 2024-06-27 ENCOUNTER — Ambulatory Visit: Payer: MEDICAID | Admitting: Family

## 2024-06-27 VITALS — BP 128/78 | HR 95 | Temp 97.8°F | Resp 19 | Ht 62.0 in | Wt 155.8 lb

## 2024-06-27 DIAGNOSIS — H6121 Impacted cerumen, right ear: Secondary | ICD-10-CM

## 2024-06-27 DIAGNOSIS — Z794 Long term (current) use of insulin: Secondary | ICD-10-CM

## 2024-06-27 DIAGNOSIS — E1165 Type 2 diabetes mellitus with hyperglycemia: Secondary | ICD-10-CM | POA: Diagnosis not present

## 2024-06-27 DIAGNOSIS — R569 Unspecified convulsions: Secondary | ICD-10-CM

## 2024-06-27 MED ORDER — LANTUS SOLOSTAR 100 UNIT/ML ~~LOC~~ SOPN: 20.0000 [IU] | PEN_INJECTOR | Freq: Two times a day (BID) | SUBCUTANEOUS | 3 refills | Status: DC

## 2024-06-27 MED ORDER — LEVETIRACETAM 1000 MG PO TABS: 1000.0000 mg | ORAL_TABLET | Freq: Two times a day (BID) | ORAL | 3 refills | Status: AC

## 2024-06-27 MED ORDER — NOVOLOG FLEXPEN 100 UNIT/ML ~~LOC~~ SOPN: 10.0000 [IU] | PEN_INJECTOR | Freq: Three times a day (TID) | SUBCUTANEOUS | 11 refills | Status: DC

## 2024-06-29 NOTE — Progress Notes (Signed)
 This encounter was created in error - please disregard.

## 2024-07-01 ENCOUNTER — Ambulatory Visit
Admission: RE | Admit: 2024-07-01 | Discharge: 2024-07-01 | Disposition: A | Discharge status: Home / Self Care | Payer: MEDICAID | Source: Ambulatory Visit | Attending: Family | Admitting: Family

## 2024-07-03 NOTE — Progress Notes (Signed)
 Provider: Roxan Plough FNP-C   Naveya Ellerman, Roxan BROCKS, NP  Patient Care Team: Sully Manzi, Roxan BROCKS, NP as PCP - General (Family Medicine)  Extended Emergency Contact Information Primary Emergency Contact: Bouknight,Carolyn Address: [address]          [place], [place] [address] [place] Home Phone: [phone] Mobile Phone: [phone] Relation: Mother Secondary Emergency Contact: [facility] Mobile Phone: [phone] Relation: Uncle Preferred language: Isadora Interpreter needed? No  Code Status: Full code Goals of care: Advanced Directive information    05/20/2024   12:57 PM  Advanced Directives  Does Patient Have a Medical Advance Directive? No  Would patient like information on creating a medical advance directive? No - Patient declined     Chief Complaint  Patient presents with   Follow-up    2 Week follow up for Diabetes & right ear lavage.     Discussed the use of AI scribe software for clinical note transcription with the patient, who gave verbal consent to proceed.  History of Present Illness   Terri [name] is a 48 year old female with diabetes who presents for a two-week follow-up for diabetes management and right ear flushing.  She has been monitoring her blood sugar levels, which have shown variability with some readings in the 200s, particularly in the evenings. Recent readings include 160, 208, 165, 150, and a notable 113. Sweets cause her blood sugar to spike, with one instance reaching 312. She is currently taking 8 units of rapid-acting insulin  and 18 units of Solsthal, which is being adjusted to 20 units.  She reports issues with her right ear, which was flushed. She used ear drops prior to the procedure and reports no pain or ringing currently. A lot of wax was removed during the first attempt, and a small piece remains close to the outside, which she feels might build up again.  She has a history of seizures and is taking Keppra   for management. She is dealing with insurance changes that have affected her medication refills.  She reports recurrent bumps in her armpits, described as 'big bumps' that are not currently open. She has used antibiotics in the past, with the last course taken a week ago. She uses alcohol on the bumps, which helps, and notes that they leave scars when they clear up. No pain in her ear or forehead, and no tenderness in her sinuses.   Past Medical History:  Diagnosis Date   Allergies    nasal congestion/eyes get itchy   DEPRESSION 10/18/2009   DVT (deep vein thrombosis) in pregnancy 2012   In setting of tobacco use/OCP   ENDOMETRIOSIS 10/18/2009   GERD 10/18/2009   History of HPV infection    MIGRAINE HEADACHE 10/18/2009   UNSPECIFIED PERIPHERAL VASCULAR DISEASE 10/18/2009   embolization to right 2nd and 3rd finger   Past Surgical History:  Procedure Laterality Date   APPENDECTOMY     BOWEL RESECTION     r/t endometriosis   OVARIAN CYST REMOVAL     bilateral   TRANSESOPHAGEAL ECHOCARDIOGRAM (CATH LAB) N/A 03/30/2024   Procedure: TRANSESOPHAGEAL ECHOCARDIOGRAM;  Surgeon: Barbaraann Darryle Ned, MD;  Location: [facility] INVASIVE CV LAB;  Service: Cardiovascular;  Laterality: N/A;    Allergies  Allergen Reactions   Dye Fdc Blue [Brilliant Blue Fcf (Fd&C Blue #1)] Hives    MRI   Other Nausea And Vomiting    Zole (antibiotics ending with zole)    Allergies as of 06/27/2024  Reactions   Dye Fdc Blue [brilliant Blue Fcf (fd&c Blue #1)] Hives   MRI   Other Nausea And Vomiting   Zole (antibiotics ending with zole)        Medication List        Accurate as of June 27, 2024 11:59 PM. If you have any questions, ask your nurse or doctor.          STOP taking these medications    Insulin  Syringe-Needle U-100 31G X 1/2 0.3 ML Misc Stopped by: Danaly Bari C Jakerria Kingbird       TAKE these medications    acetaminophen  325 MG tablet Commonly known as: TYLENOL  Take 2 tablets (650 mg  total) by mouth every 6 (six) hours as needed for headache.   apixaban  5 MG Tabs tablet Commonly known as: ELIQUIS  Take 1 tablet (5 mg total) by mouth 2 (two) times daily.   atorvastatin  80 MG tablet Commonly known as: LIPITOR  Take 1 tablet (80 mg total) by mouth daily.   busPIRone  5 MG tablet Commonly known as: BUSPAR  Take 1 tablet (5 mg total) by mouth 3 (three) times daily as needed (anxiety).   FreeStyle Libre 3 Reader [place] Use as directed 4 (four) times daily -  before meals and at bedtime.   FreeStyle Libre 3 Sensor Misc Place 1 sensor on the skin every 14 days. Use to check glucose continuously   Insupen Pen Needles 32G X 4 MM Misc Generic drug: Insulin  Pen Needle Use 2 (two) times daily.   Lantus  SoloStar 100 UNIT/ML Solostar Pen Generic drug: insulin  glargine Inject 20 Units into the skin 2 (two) times daily. What changed: how much to take Changed by: Leelynn Whetsel C Craig Wisnewski   levETIRAcetam  1000 MG tablet Commonly known as: KEPPRA  Take 1 tablet (1,000 mg total) by mouth 2 (two) times daily. What changed: See the new instructions. Changed by: Takila Kronberg C Ronav Furney   losartan 25 MG tablet Commonly known as: COZAAR Take 1 tablet (25 mg total) by mouth daily.   NovoLOG  FlexPen 100 UNIT/ML FlexPen Generic drug: insulin  aspart Inject 10 Units into the skin 3 (three) times daily with meals. What changed:  how much to take Another medication with the same name was removed. Continue taking this medication, and follow the directions you see here. Changed by: Reason Helzer C Kimberla Driskill   polyethylene glycol powder 17 GM/SCOOP powder Commonly known as: MiraLax  Take 17 g by mouth 2 (two) times daily as needed for moderate constipation.   saccharomyces boulardii 250 MG capsule Commonly known as: FLORASTOR Take 1 capsule (250 mg total) by mouth 2 (two) times daily.        Review of Systems  Constitutional:  Negative for appetite change, chills, fatigue, fever and unexpected weight  change.  HENT:  Negative for congestion, dental problem, ear discharge, ear pain, facial swelling, hearing loss, nosebleeds, postnasal drip, rhinorrhea, sinus pressure, sinus pain, sneezing, sore throat, tinnitus and trouble swallowing.   Eyes:  Negative for pain, discharge, redness, itching and visual disturbance.  Respiratory:  Negative for cough, chest tightness, shortness of breath and wheezing.   Cardiovascular:  Negative for chest pain, palpitations and leg swelling.  Gastrointestinal:  Negative for abdominal distention, abdominal pain, blood in stool, constipation, diarrhea, nausea and vomiting.  Genitourinary:  Negative for difficulty urinating, dysuria, flank pain, frequency and urgency.  Musculoskeletal:  Negative for arthralgias, back pain, gait problem, joint swelling, myalgias, neck pain and neck stiffness.  Skin:  Negative for color change, pallor, rash and wound.  Neurological:  Negative for dizziness, syncope, speech difficulty, weakness, light-headedness, numbness and headaches.  Hematological:  Does not bruise/bleed easily.  Psychiatric/Behavioral:  Negative for agitation, behavioral problems, confusion, hallucinations, self-injury, sleep disturbance and suicidal ideas. The patient is not nervous/anxious.     Immunization History  Administered Date(s) Administered   INFLUENZA, HIGH DOSE SEASONAL PF 05/20/2024   Influenza,inj,Quad PF,6+ Mos 07/12/2019   PNEUMOCOCCAL CONJUGATE-20 05/20/2024   Pneumococcal Polysaccharide-23 08/26/2007, 07/12/2019   Td 08/25/1998   Tdap 07/28/2011, 06/06/2024   Pertinent  Health Maintenance Due  Topic Date Due   FOOT EXAM  Never done   OPHTHALMOLOGY EXAM  Never done   Mammogram  Never done   Colonoscopy  Never done   HEMOGLOBIN A1C  09/29/2024   Influenza Vaccine  Completed      07/12/2019   12:10 AM 07/12/2019    9:00 AM 04/29/2024    1:31 PM 05/31/2024    1:20 PM 06/06/2024   10:40 AM  Fall Risk  Falls in the past year?   1 0 0   Was there an injury with Fall?   1  0  Was there an injury with Fall? - Comments   bruises    Fall Risk Category Calculator   3  0  (RETIRED) Patient Fall Risk Level       Patient at Risk for Falls Due to     No Fall Risks  Fall risk Follow up     Falls evaluation completed     Information is confidential and restricted. Go to Review Flowsheets to unlock data.   Functional Status Survey:    Vitals:   06/27/24 1421  BP: 128/78  Pulse: 95  Resp: 19  Temp: 97.8 F (36.6 C)  SpO2: 98%  Weight: 155 lb 12.8 oz (70.7 kg)  Height: 5' 2 (1.575 m)   Body mass index is 28.5 kg/m.  Physical Exam   GENERAL: Alert, cooperative, well developed, no acute distress. HEENT: Normocephalic, normal oropharynx, moist mucous membranes, eardrum visible in right ear, no sinus tenderness. CHEST: Clear to auscultation bilaterally, no wheezes, rhonchi, or crackles. CARDIOVASCULAR: Normal heart rate and rhythm, S1 and S2 normal without murmurs. ABDOMEN: Soft, non-tender, non-distended, without organomegaly, normal bowel sounds. EXTREMITIES: No cyanosis or edema. NEUROLOGICAL: Cranial nerves grossly intact, moves all extremities without gross motor or sensory deficit. SKIN: No lesions on left or right armpit.      Labs reviewed: Recent Labs    03/29/24 0718 03/30/24 0453 03/31/24 0337 04/01/24 0527 04/04/24 0534 04/07/24 0435  NA 136 137 138 140 138 138  K 3.2* 4.2 4.5 4.7 4.0 3.5  CL 104 108 106 107 111 112*  CO2 24 21* 24 20* 19* 18*  GLUCOSE 238* 212* 147* 214* 152* 149*  BUN <5* <5* 5* 7 9 10   CREATININE 0.49 0.65 0.67 0.64 0.68 0.64  CALCIUM  8.8* 8.8* 8.8* 9.1 9.0 9.2  MG 1.7 2.0 1.9  --   --   --   PHOS 4.4 4.3 5.1*  --   --   --    Recent Labs    03/28/24 0524 03/30/24 0453 03/31/24 0337 04/01/24 0527  AST 22 17  --  21  ALT 10 12  --  12  ALKPHOS 93 67  --  68  BILITOT 0.7 0.6  --  0.9  PROT 6.8 6.2*  --  6.2*  ALBUMIN 3.2* 2.7* 2.7* 2.8*   Recent Labs     03/28/24 0538 03/28/24 0546  03/30/24 0453 04/01/24 0527 04/04/24 0534  WBC 14.0*   < > 9.4 7.4 8.6  NEUTROABS 9.6*  --   --  4.2  --   HGB 13.7   < > 12.9 13.2 13.4  HCT 40.9   < > 38.5 40.6 39.6  MCV 88.1   < > 86.9 88.8 88.0  PLT 341   < > 324 320 323   < > = values in this interval not displayed.   Lab Results  Component Value Date   TSH 1.324 07/09/2019   Lab Results  Component Value Date   HGBA1C 13.7 (H) 03/29/2024   Lab Results  Component Value Date   CHOL 233 (H) 03/29/2024   HDL 38 (L) 03/29/2024   LDLCALC UNABLE TO CALCULATE IF TRIGLYCERIDE OVER 400 mg/dL 91/94/7974   LDLDIRECT 132 (H) 03/29/2024   TRIG 425 (H) 03/29/2024   CHOLHDL 6.1 03/29/2024    Significant Diagnostic Results in last 30 days:  No results found.  Assessment/Plan  Type 2 diabetes mellitus Fluctuating blood glucose levels. Recent readings show improvement with some values in the 100s, but evening readings remain elevated in the 200s. Current insulin  regimen includes 8 units of rapid-acting insulin  and 18 units of long-acting insulin . Insurance does not cover newer medications like Ozempic or Mounjaro. - Increased long-acting insulin  (Lantus ) to 20 units daily. - Increased rapid-acting insulin  (Novolog ) to 10 units with meals. - Continue to monitor blood glucose levels regularly. - Advised dietary modifications to reduce carbohydrate and sugar intake. - Encouraged regular physical activity, such as walking. - Scheduled follow-up appointment in two weeks to assess progress.  Seizure disorder Managed with Keppra . Insurance issues have caused confusion with medication refills. - Sent prescription for Keppra  to pharmacy. - Provided refills for Keppra  as needed.  Cerumen impaction, right ear Cerumen impaction in the right ear with previous successful removal using ear drops. Residual small piece of cerumen noted, but eardrum is visible. - Continue using ear drops to soften cerumen. - Monitor  for any recurrence of symptoms.  Hidradenitis suppurativa [place], previously treated with antibiotics. Current lesions are not open but present as large bumps. Blood sugar control is important to prevent recurrence. - Use antibacterial soap, such as Dial, on affected areas. - Monitor blood sugar levels to prevent exacerbation of hidradenitis suppurativa.   Family/ staff Communication: Reviewed plan of care with patient verbalized understanding  Labs/tests ordered: None   Next Appointment : Return in about 2 weeks (around 07/11/2024) for Type 2 Diabetes Mellitus .   Spent 30 minutes of Face to face and non-face to face with patient  >50% time spent counseling; reviewing medical record; tests; labs; documentation and developing future plan of care.   Roxan JAYSON Plough, NP

## 2024-07-11 ENCOUNTER — Ambulatory Visit (INDEPENDENT_AMBULATORY_CARE_PROVIDER_SITE_OTHER): Payer: MEDICAID | Admitting: Family

## 2024-07-11 ENCOUNTER — Other Ambulatory Visit: Payer: Self-pay | Admitting: Family

## 2024-07-11 ENCOUNTER — Encounter: Payer: Self-pay | Admitting: Family

## 2024-07-11 VITALS — BP 130/80 | HR 90 | Temp 97.6°F | Resp 20 | Ht 62.0 in | Wt 152.2 lb

## 2024-07-11 DIAGNOSIS — I10 Essential (primary) hypertension: Secondary | ICD-10-CM | POA: Insufficient documentation

## 2024-07-11 DIAGNOSIS — E1165 Type 2 diabetes mellitus with hyperglycemia: Secondary | ICD-10-CM

## 2024-07-11 DIAGNOSIS — Z794 Long term (current) use of insulin: Secondary | ICD-10-CM

## 2024-07-11 DIAGNOSIS — E785 Hyperlipidemia, unspecified: Secondary | ICD-10-CM

## 2024-07-11 DIAGNOSIS — K219 Gastro-esophageal reflux disease without esophagitis: Secondary | ICD-10-CM

## 2024-07-11 DIAGNOSIS — Z1159 Encounter for screening for other viral diseases: Secondary | ICD-10-CM | POA: Diagnosis not present

## 2024-07-11 MED ORDER — FREESTYLE LIBRE 3 SENSOR MISC: 1.0000 | 3 refills | Status: AC

## 2024-07-11 MED ORDER — INSULIN PEN NEEDLE 32G X 4 MM MISC: 1.0000 | Freq: Two times a day (BID) | 1 refills | Status: DC

## 2024-07-11 NOTE — Progress Notes (Signed)
 Provider: Roxan Plough FNP-C  Baldo Hufnagle, Roxan BROCKS, NP  Patient Care Team: Luva Metzger, Roxan BROCKS, NP as PCP - General (Family Medicine)  Extended Emergency Contact Information Primary Emergency Contact: Blackson,Carolyn Address: [address]          [place], [place] [address] [place] Home Phone: [phone] Mobile Phone: [phone] Relation: Mother Secondary Emergency Contact: [facility] Mobile Phone: [phone] Relation: Uncle Preferred language: Isadora Interpreter needed? No  Code Status:  Full Code  Goals of care: Advanced Directive information    05/20/2024   12:57 PM  Advanced Directives  Does Patient Have a Medical Advance Directive? No  Would patient like information on creating a medical advance directive? No - Patient declined     Chief Complaint  Patient presents with   Follow-up    2 Week Follow Up.    Discussed the use of AI scribe software for clinical note transcription with the patient, who gave verbal consent to proceed.  History of Present Illness   Terri [name] is a 48 year old female with diabetes who presents for a two-week follow-up on blood sugar management.  She has been monitoring her blood sugar levels at home, with morning readings ranging from 114 to 175 mg/dL, pre-lunch readings from 93 to 161 mg/dL, and pre-dinner readings from 125 to 204 mg/dL. Higher readings may be related to consuming sugary foods or drinks, such as soda or Pepto-Bismol. She is currently on Lantus  20 units every morning and evening, and Novolog  10 units three times a day. She adjusts her Novolog  dose by adding 2 units if her blood sugar exceeds 200 mg/dL. No recent episodes of hypoglycemia.  Her most recent blood pressure reading was 130/80 mmHg. She has a history of stroke and experiences soreness, which she hopes will improve over time. She has not had any seizures recently and has stopped smoking.  She experiences numbness and tingling in  her hands, which she associates with her diabetes. She occasionally trims her own toenails but requires professional care to prevent complications.  She has not had recent lab work but plans to have her cholesterol, thyroid, CBC, and A1c rechecked. She has not seen an eye doctor or podiatrist recently but has been referred for these evaluations.   Past Medical History:  Diagnosis Date   Allergies    nasal congestion/eyes get itchy   DEPRESSION 10/18/2009   DVT (deep vein thrombosis) in pregnancy 2012   In setting of tobacco use/OCP   ENDOMETRIOSIS 10/18/2009   GERD 10/18/2009   History of HPV infection    MIGRAINE HEADACHE 10/18/2009   UNSPECIFIED PERIPHERAL VASCULAR DISEASE 10/18/2009   embolization to right 2nd and 3rd finger   Past Surgical History:  Procedure Laterality Date   APPENDECTOMY     BOWEL RESECTION     r/t endometriosis   OVARIAN CYST REMOVAL     bilateral   TRANSESOPHAGEAL ECHOCARDIOGRAM (CATH LAB) N/A 03/30/2024   Procedure: TRANSESOPHAGEAL ECHOCARDIOGRAM;  Surgeon: Barbaraann Darryle Ned, MD;  Location: [facility] INVASIVE CV LAB;  Service: Cardiovascular;  Laterality: N/A;    Allergies  Allergen Reactions   Dye Fdc Blue [Brilliant Blue Fcf (Fd&C Blue #1)] Hives    MRI   Other Nausea And Vomiting    Zole (antibiotics ending with zole)    Outpatient Encounter Medications as of 07/11/2024  Medication Sig   acetaminophen  (TYLENOL ) 325 MG tablet Take 2 tablets (650 mg total) by mouth every 6 (six) hours as needed for headache.  apixaban  (ELIQUIS ) 5 MG TABS tablet Take 1 tablet (5 mg total) by mouth 2 (two) times daily.   atorvastatin  (LIPITOR ) 80 MG tablet Take 1 tablet (80 mg total) by mouth daily.   busPIRone  (BUSPAR ) 5 MG tablet Take 1 tablet (5 mg total) by mouth 3 (three) times daily as needed (anxiety).   Continuous Glucose Receiver (FREESTYLE LIBRE 3 READER) DEVI Use as directed 4 (four) times daily -  before meals and at bedtime.   insulin  aspart (NOVOLOG   FLEXPEN) 100 UNIT/ML FlexPen Inject 10 Units into the skin 3 (three) times daily with meals.   insulin  glargine (LANTUS  SOLOSTAR) 100 UNIT/ML Solostar Pen Inject 20 Units into the skin 2 (two) times daily.   levETIRAcetam  (KEPPRA ) 1000 MG tablet Take 1 tablet (1,000 mg total) by mouth 2 (two) times daily.   losartan (COZAAR) 25 MG tablet Take 1 tablet (25 mg total) by mouth daily.   polyethylene glycol powder (MIRALAX ) 17 GM/SCOOP powder Take 17 g by mouth 2 (two) times daily as needed for moderate constipation.   saccharomyces boulardii (FLORASTOR) 250 MG capsule Take 1 capsule (250 mg total) by mouth 2 (two) times daily.   [DISCONTINUED] Continuous Glucose Sensor (FREESTYLE LIBRE 3 SENSOR) MISC Place 1 sensor on the skin every 14 days. Use to check glucose continuously   [DISCONTINUED] Insulin  Pen Needle 32G X 4 MM MISC Use 2 (two) times daily.   Continuous Glucose Sensor (FREESTYLE LIBRE 3 SENSOR) MISC Place 1 sensor on the skin every 14 days. Use to check glucose continuously   Insulin  Pen Needle 32G X 4 MM MISC Use 2 (two) times daily.   No facility-administered encounter medications on file as of 07/11/2024.    Review of Systems  Constitutional:  Negative for appetite change, chills, fatigue, fever and unexpected weight change.  HENT:  Negative for congestion, dental problem, ear discharge, ear pain, facial swelling, hearing loss, nosebleeds, postnasal drip, rhinorrhea, sinus pressure, sinus pain, sneezing, sore throat, tinnitus and trouble swallowing.   Eyes:  Negative for pain, discharge, redness, itching and visual disturbance.  Respiratory:  Negative for cough, chest tightness, shortness of breath and wheezing.   Cardiovascular:  Negative for chest pain, palpitations and leg swelling.  Gastrointestinal:  Negative for abdominal distention, abdominal pain, blood in stool, constipation, diarrhea, nausea and vomiting.  Endocrine: Negative for cold intolerance, heat intolerance, polydipsia,  polyphagia and polyuria.  Genitourinary:  Negative for difficulty urinating, dysuria, flank pain, frequency and urgency.  Musculoskeletal:  Negative for arthralgias, back pain, gait problem, joint swelling, myalgias, neck pain and neck stiffness.  Skin:  Negative for color change, pallor, rash and wound.  Neurological:  Negative for dizziness, syncope, speech difficulty, weakness, light-headedness, numbness and headaches.  Hematological:  Does not bruise/bleed easily.  Psychiatric/Behavioral:  Negative for agitation, behavioral problems, confusion, hallucinations, self-injury, sleep disturbance and suicidal ideas. The patient is not nervous/anxious.     Immunization History  Administered Date(s) Administered   INFLUENZA, HIGH DOSE SEASONAL PF 05/20/2024   Influenza,inj,Quad PF,6+ Mos 07/12/2019   PNEUMOCOCCAL CONJUGATE-20 05/20/2024   Pneumococcal Polysaccharide-23 08/26/2007, 07/12/2019   Td 08/25/1998   Tdap 07/28/2011, 06/06/2024   Pertinent  Health Maintenance Due  Topic Date Due   FOOT EXAM  Never done   OPHTHALMOLOGY EXAM  Never done   Colonoscopy  Never done   HEMOGLOBIN A1C  09/29/2024   Mammogram  07/01/2026   Influenza Vaccine  Completed      07/12/2019   12:10 AM 07/12/2019  9:00 AM 04/29/2024    1:31 PM 05/31/2024    1:20 PM 06/06/2024   10:40 AM  Fall Risk  Falls in the past year?   1 0 0  Was there an injury with Fall?   1  0  Was there an injury with Fall? - Comments   bruises    Fall Risk Category Calculator   3  0  (RETIRED) Patient Fall Risk Level       Patient at Risk for Falls Due to     No Fall Risks  Fall risk Follow up     Falls evaluation completed     Information is confidential and restricted. Go to Review Flowsheets to unlock data.   Functional Status Survey:    Vitals:   07/11/24 1425  BP: 130/80  Pulse: 90  Resp: 20  Temp: 97.6 F (36.4 C)  SpO2: 93%  Weight: 152 lb 3.2 oz (69 kg)  Height: 5' 2 (1.575 m)   Body mass index is  27.84 kg/m. Physical Exam VITALS: T- 97.6, P- 90, BP- 130/80, SaO2- 93% MEASUREMENTS: Weight- 152. GENERAL: Alert, cooperative, well developed, no acute distress. HEENT: Normocephalic, normal oropharynx, moist mucous membranes. CHEST: Clear to auscultation bilaterally, no wheezes, rhonchi, or crackles. CARDIOVASCULAR: Normal heart rate and rhythm, S1 and S2 normal without murmurs. ABDOMEN: Soft, non-tender, non-distended, without organomegaly, normal bowel sounds. EXTREMITIES: No cyanosis, edema, or swelling. NEUROLOGICAL: Cranial nerves grossly intact, moves all extremities without gross motor or sensory deficit.  SKIN: No rash,no lesion or erythema   PSYCHIATRY/BEHAVIORAL: Mood stable    Labs reviewed: Recent Labs    03/29/24 0718 03/30/24 0453 03/31/24 0337 04/01/24 0527 04/04/24 0534 04/07/24 0435  NA 136 137 138 140 138 138  K 3.2* 4.2 4.5 4.7 4.0 3.5  CL 104 108 106 107 111 112*  CO2 24 21* 24 20* 19* 18*  GLUCOSE 238* 212* 147* 214* 152* 149*  BUN <5* <5* 5* 7 9 10   CREATININE 0.49 0.65 0.67 0.64 0.68 0.64  CALCIUM  8.8* 8.8* 8.8* 9.1 9.0 9.2  MG 1.7 2.0 1.9  --   --   --   PHOS 4.4 4.3 5.1*  --   --   --    Recent Labs    03/28/24 0524 03/30/24 0453 03/31/24 0337 04/01/24 0527  AST 22 17  --  21  ALT 10 12  --  12  ALKPHOS 93 67  --  68  BILITOT 0.7 0.6  --  0.9  PROT 6.8 6.2*  --  6.2*  ALBUMIN 3.2* 2.7* 2.7* 2.8*   Recent Labs    03/28/24 0538 03/28/24 0546 03/30/24 0453 04/01/24 0527 04/04/24 0534  WBC 14.0*   < > 9.4 7.4 8.6  NEUTROABS 9.6*  --   --  4.2  --   HGB 13.7   < > 12.9 13.2 13.4  HCT 40.9   < > 38.5 40.6 39.6  MCV 88.1   < > 86.9 88.8 88.0  PLT 341   < > 324 320 323   < > = values in this interval not displayed.   Lab Results  Component Value Date   TSH 1.324 07/09/2019   Lab Results  Component Value Date   HGBA1C 13.7 (H) 03/29/2024   Lab Results  Component Value Date   CHOL 233 (H) 03/29/2024   HDL 38 (L) 03/29/2024    LDLCALC UNABLE TO CALCULATE IF TRIGLYCERIDE OVER 400 mg/dL 91/94/7974   LDLDIRECT 132 (H)  03/29/2024   TRIG 425 (H) 03/29/2024   CHOLHDL 6.1 03/29/2024    Significant Diagnostic Results in last 30 days:  MM 3D SCREENING MAMMOGRAM BILATERAL BREAST Result Date: 07/05/2024 CLINICAL DATA:  Screening. EXAM: DIGITAL SCREENING BILATERAL MAMMOGRAM WITH TOMOSYNTHESIS AND CAD TECHNIQUE: Bilateral screening digital craniocaudal and mediolateral oblique mammograms were obtained. Bilateral screening digital breast tomosynthesis was performed. The images were evaluated with computer-aided detection. COMPARISON:  None available. ACR Breast Density Category c: The breasts are heterogeneously dense, which may obscure small masses. FINDINGS: There are no findings suspicious for malignancy. IMPRESSION: No mammographic evidence of malignancy. A result letter of this screening mammogram will be mailed directly to the patient. RECOMMENDATION: Screening mammogram in one year. (Code:SM-B-01Y) BI-RADS CATEGORY  1: Negative. Electronically Signed   By: Corean Salter M.D.   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 No hypoglycemic episodes reported. Current insulin  regimen includes Lantus  20 units twice daily and Novolog  10 units three times daily. Diabetic polyneuropathy is present with numbness in hands. - Continue Lantus  20 units twice daily and Novolog  10 units three times daily. - Increase Novolog  by 2 units if blood sugars exceed 200 mg/dL. - Avoid sugary foods and drinks, including sodas and medications with high sugar content. - Engage in regular walking exercises to help lower blood sugars. - Referred to podiatrist for toenail care to prevent complications from diabetes. - Ordered lab work including A1c,  cholesterol, thyroid, CBC, kidney, liver, electrolytes, and hepatitis C screening.  Hypertension Blood pressure is well-controlled at 130/80 mmHg, which is appropriate for someone with diabetes. - Continue current blood pressure management regimen.  Hyperlipidemia Cholesterol levels were last checked in August. Dietary modifications are recommended to lower cholesterol and prevent further cardiovascular events. - Ordered cholesterol lab work. - Advised dietary changes to reduce cholesterol, including avoiding butter, greasy foods, and using air fryers or steaming instead of frying. - Encouraged consumption of beans, nuts, and lentils as protein sources.  History of cerebrovascular accident (stroke) No recent seizures reported. Blood sugar and blood pressure control are crucial to prevent another stroke. Dietary changes and exercise are emphasized to reduce stroke risk. - Continue to control blood sugar and blood pressure to prevent another stroke. - Advised dietary changes to reduce stroke risk, including avoiding sodas, junk food, and high-salt foods. - Encouraged regular exercise to improve overall health and reduce stroke risk.   Family/ staff Communication: Reviewed plan of care with patient verbalized understanding  Labs/tests ordered:  - CBC with Differential/Platelet - CMP with eGFR([facility]) - TSH - Hgb A1C - Lipid panel - Hep C Antibody  Next Appointment: Return in about 4 months (around 11/08/2024) for medical mangement of chronic issues., fasting labs prior to visit.   Total time: 30 minutes. Greater than 50% of total time spent doing patient education regarding T2DM,HTN, HLD,GERD and health maintenance including symptom/medication management.   Roxan JAYSON Plough, NP

## 2024-07-12 LAB — LIPID PANEL
Cholesterol: 99 mg/dL (ref <200)
HDL: 27 mg/dL — ABNORMAL LOW (ref >=50)
LDL Cholesterol (Calc): 45 mg/dL
Non-HDL Cholesterol (Calc): 72 mg/dL (ref <130)
Total CHOL/HDL Ratio: 3.7 (calc) (ref <5.0)
Triglycerides: 200 mg/dL — ABNORMAL HIGH (ref <150)

## 2024-07-12 LAB — CBC WITH DIFFERENTIAL/PLATELET
Absolute Lymphocytes: 3491 {cells}/uL (ref 850–3900)
Absolute Monocytes: 680 {cells}/uL (ref 200–950)
Basophils Absolute: 16 {cells}/uL (ref 0–200)
Basophils Relative: 0.2 %
Eosinophils Absolute: 122 {cells}/uL (ref 15–500)
Eosinophils Relative: 1.5 %
HCT: 39 % (ref 35.0–45.0)
Hemoglobin: 13.1 g/dL (ref 11.7–15.5)
MCH: 28.6 pg (ref 27.0–33.0)
MCHC: 33.6 g/dL (ref 32.0–36.0)
MCV: 85.2 fL (ref 80.0–100.0)
MPV: 10.9 fL (ref 7.5–12.5)
Monocytes Relative: 8.4 %
Neutro Abs: 3791 {cells}/uL (ref 1500–7800)
Neutrophils Relative %: 46.8 %
Platelets: 330 Thousand/uL (ref 140–400)
RBC: 4.58 Million/uL (ref 3.80–5.10)
RDW: 12.7 % (ref 11.0–15.0)
Total Lymphocyte: 43.1 %
WBC: 8.1 Thousand/uL (ref 3.8–10.8)

## 2024-07-12 LAB — COMPLETE METABOLIC PANEL WITHOUT GFR
AG Ratio: 1.1 (calc) (ref 1.0–2.5)
ALT: 14 U/L (ref 6–29)
AST: 12 U/L (ref 10–35)
Albumin: 4 g/dL (ref 3.6–5.1)
Alkaline phosphatase (APISO): 96 U/L (ref 31–125)
BUN: 17 mg/dL (ref 7–25)
CO2: 24 mmol/L (ref 20–32)
Calcium: 9.7 mg/dL (ref 8.6–10.2)
Chloride: 108 mmol/L (ref 98–110)
Creat: 0.92 mg/dL (ref 0.50–0.99)
Globulin: 3.6 g/dL (ref 1.9–3.7)
Glucose, Bld: 102 mg/dL (ref 65–139)
Potassium: 3.5 mmol/L (ref 3.5–5.3)
Sodium: 140 mmol/L (ref 135–146)
Total Bilirubin: 0.5 mg/dL (ref 0.2–1.2)
Total Protein: 7.6 g/dL (ref 6.1–8.1)

## 2024-07-12 LAB — HEPATITIS C ANTIBODY: Hepatitis C Ab: NONREACTIVE

## 2024-07-12 LAB — HEMOGLOBIN A1C
Hgb A1c MFr Bld: 9.4 % — ABNORMAL HIGH (ref <5.7)
Mean Plasma Glucose: 223 mg/dL
eAG (mmol/L): 12.4 mmol/L

## 2024-07-12 LAB — TSH: TSH: 0.04 m[IU]/L — ABNORMAL LOW

## 2024-07-18 ENCOUNTER — Telehealth: Payer: Self-pay

## 2024-07-18 NOTE — Telephone Encounter (Signed)
 Copied from CRM [id number]. Topic: Clinical - Medication Prior Auth >> Jul 18, 2024  3:30 PM Alfonso ORN wrote: Reason for CRM: patient stated that her pharmacy need prior authorization for the  Continuous Glucose Sensor (FREESTYLE LIBRE 3 SENSOR) MISC  Please contact patient to let status   [id number] [place], [place] - [address] AT [facility] [address] [facility] [address] [place] [address] Phone: [phone] Fax: [phone] Hours: Not open 24 hours

## 2024-07-18 NOTE — Telephone Encounter (Signed)
 Usually we receive a fax from the pharmacy if a Prior Authorization is required.             .  PA initiated through covermymeds. Key: [id number]  Awaiting reply from the insurance company which will be determined in 48-72 hours.

## 2024-07-19 NOTE — Telephone Encounter (Signed)
 RX approved (see media for approval letter)  I called patients pharmacy and left a detailed voicemail notifying them of prior authorization approval.

## 2024-07-20 ENCOUNTER — Ambulatory Visit: Payer: Self-pay | Admitting: Family

## 2024-07-20 DIAGNOSIS — R7989 Other specified abnormal findings of blood chemistry: Secondary | ICD-10-CM

## 2024-07-26 ENCOUNTER — Encounter: Payer: Self-pay | Admitting: Podiatry

## 2024-07-26 ENCOUNTER — Ambulatory Visit: Payer: MEDICAID | Admitting: Podiatry

## 2024-07-26 DIAGNOSIS — B351 Tinea unguium: Secondary | ICD-10-CM

## 2024-07-26 DIAGNOSIS — M79671 Pain in right foot: Secondary | ICD-10-CM | POA: Diagnosis not present

## 2024-07-26 DIAGNOSIS — L600 Ingrowing nail: Secondary | ICD-10-CM

## 2024-07-26 DIAGNOSIS — M79672 Pain in left foot: Secondary | ICD-10-CM | POA: Diagnosis not present

## 2024-07-26 NOTE — Progress Notes (Signed)
 Patient presents for evaluation and treatment of tenderness and some redness around nails feet.  Tenderness around toes with walking and wearing shoes.  Gets tingling in the feet at times.  She is a type II diabetic.  Complains of tenderness along the lateral border of the hallux nail right.  Says it is a little red sometimes.  Does not note any purulent drainage.  She had a CVA back in August 2025 and is still recovering from that.  Her last hemoglobin A1c was 9.4  Physical exam:  General appearance: Alert, pleasant, and in no acute distress.  Vascular: Pedal pulses: DP 2/4 B/L, PT 2/4 B/L.  Mild edema lower legs bilaterally.  Capillary refill time immediate bilaterally  Neurologic: Light touch intact bilaterally.  Monofilament sensation intact bilaterally.  Vibratory sensation diminished bilaterally.  Achilles tendon reflex intact bilaterally  Dermatologic:  Nails thickened, disfigured, discolored 1-5 BL with subungual debris.  Redness and hypertrophic nail folds along nail folds bilaterally but no signs of drainage or infection.  Incurvated ingrown nail border lateral border hallux nail right.  No signs of drainage or infection.  Nail border is little hypertrophied and red  Musculoskeletal:  Hammertoes 2 through 5 bilaterally.  Normal muscle strength lower extremity bilaterally   Diagnosis: 1. Painful onychomycotic nails 1 through 5 bilaterally. 2. Pain toes 1 through 5 bilaterally. 3.  Ingrown lateral nail border hallux nail right  Plan: -New patient office visit for evaluation and management level 3.  Modifier 25. - Discussed with the ingrown nail on the lateral border of the hallux right.  Given that her hemoglobin A1c indicates poorly controlled diabetes at this point would advise she is trimming the nail border today.  If it does become more of a problem or starts getting infected we may need to do an avulsion.  Footer to get her hemoglobin A1c under 8.0 and under good control we  can go ahead and do a matrixectomy for permanent correction of the problem.  Until then we can just do periodic debridement of the onychomycotic nails.4  -Debrided onychomycotic nails 1 through 5 bilaterally.  Sharply debrided nails with nail clipper and reduced with a power bur.  Return 3 months [facility]

## 2024-08-01 ENCOUNTER — Ambulatory Visit: Payer: MEDICAID | Admitting: Neurology

## 2024-08-01 ENCOUNTER — Encounter: Payer: Self-pay | Admitting: Neurology

## 2024-08-01 VITALS — BP 99/68 | HR 111 | Ht 62.0 in | Wt 149.0 lb

## 2024-08-01 DIAGNOSIS — I6601 Occlusion and stenosis of right middle cerebral artery: Secondary | ICD-10-CM

## 2024-08-01 DIAGNOSIS — I63031 Cerebral infarction due to thrombosis of right carotid artery: Secondary | ICD-10-CM

## 2024-08-01 DIAGNOSIS — R569 Unspecified convulsions: Secondary | ICD-10-CM | POA: Diagnosis not present

## 2024-08-01 NOTE — Progress Notes (Signed)
 [facility] [address] [place]. [place] [address] [phone]       OFFICE CONSULT NOTE  Ms. Terri [name] Date of Birth:  [birth year] Medical Record Number:  [id number]   Referring MD: Sharlet Schmitz, PA-C  Reason for Referral: Stroke  HPI: Terri [name] is a 48 year old lady seen today for initial office consultation visit for stroke.  History is obtained from the patient and review of electronic medical records.  I personally reviewed pertinent available imaging films in PACS.  She has past medical history of diabetes, gastroesophageal flux disease, migraines, endometriosis and depression.  She presented on 8//2025 for evaluation for sudden onset of left-sided weakness.  Her problems began the night prior but did not seek care right away.  Next day she noticed difficulty walking with weakness in the legs and dizziness.  She went to the bathroom and upon standing up from the toilet.  Left-sided weakness was much worse.  She was brought in as a code stroke.  CT scan she was noticed to have weakness convulsive seizures with return of the left arm, left gaze deviation and tenderness.  She was given 1 mg Ativan  and showed good improvement.  NIH stroke scale was 8.  CT scan of the head showed area of mixed density in the right frontal lobe adjacent to an old area of encephalomalacia raising question of subacute infarct. CT angiogram showed acute thrombus in both common carotid artery with large volume in the right no large vessel occlusion.  Multifocal abnormal enhancement of the right hemisphere raise concern for subacute infarct.  MRI scan of the brain confirmed parts of the right hemisphere adjacent to the old infarct.  Transthoracic echo showed a hyperdynamic EF of 70 to 75%.  Left atrial size was normal.  Transesophageal echo showed no evidence of clot or PFO.  Lower extremity venous Dopplers were negative for DVT.  LDL cholesterol 132 mg percent.  Triglycerides 425 and  hemoglobin A1c 7.7.  Urine drug screen was positive for THC.  ANA panel was negative.  Hypercoagulable panel labs were all negative as well.  Patient was started on Keppra  1 g twice daily for seizures patient states she has done well since discharge.  She  is able to ambulate independently without a cane.  She occasionally feels like her legs are heavy.  Still has some dizziness fine motor skills in the left hand.  She has finished home physical and Occupational Therapy.  She is currently living with her mother.  She is mostly independent in her activities except cannot cook.  She has had no further seizures or recurrent stroke or TIA symptoms.  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    Social History:  Social History   Socioeconomic History   Marital status: Single    Spouse name: Not on file   Number of children: Not on file   Years of education: Not on file   Highest education level: Some college, no degree  Occupational History  Not on file  Tobacco Use   Smoking status: Former    Current packs/day: 0.50    Average packs/day: 0.5 packs/day for 15.0 years (7.5 ttl pk-yrs)    Types: Cigarettes   Smokeless tobacco: Never  Vaping Use   Vaping status: Never Used  Substance and Sexual Activity   Alcohol use: Yes   Drug use: Yes    Types: Marijuana    Comment: undetermined amount   Sexual activity: Never    Birth control/protection: None  Other Topics Concern   Not on file  Social History Narrative   Not on file    Social Drivers of Health   Financial Resource Strain: Medium Risk (06/04/2024)   Overall Financial Resource Strain (CARDIA)    Difficulty of Paying Living Expenses: Somewhat hard  Food Insecurity: No Food Insecurity (06/04/2024)   Hunger Vital Sign    Worried About Running Out of Food in the Last Year: Never true    Ran Out of Food in the Last Year: Never true  Recent Concern: Food Insecurity - Food Insecurity Present (04/22/2024)   Hunger Vital Sign    Worried About Running Out of Food in the Last Year: Sometimes true    Ran Out of Food in the Last Year: Not on file  Transportation Needs: No Transportation Needs (06/04/2024)   PRAPARE - Administrator, Civil Service (Medical): No    Lack of Transportation (Non-Medical): No  Physical Activity: Inactive (06/04/2024)   Exercise Vital Sign    Days of Exercise per Week: 0 days    Minutes of Exercise per Session: Not on file  Stress: Stress Concern Present (06/04/2024)   [employer] of Occupational Health - Occupational Stress Questionnaire    Feeling of Stress: To some extent  Social Connections: Unknown (06/04/2024)   Social Connection and Isolation Panel    Frequency of Communication with Friends and Family: Three times a week    Frequency of Social Gatherings with Friends and Family: More than three times a week    Attends Religious Services: Never    Database Administrator or Organizations: No    Attends Engineer, Structural: Not on file    Marital Status: Not on file  Intimate Partner Violence: Not At Risk (03/30/2024)   Humiliation, Afraid, Rape, and Kick questionnaire    Fear of Current or Ex-Partner: No    Emotionally Abused: No    Physically Abused: No    Sexually Abused: No    Medications:   Current Outpatient Medications on File Prior to Visit  Medication Sig Dispense Refill   acetaminophen  (TYLENOL ) 325 MG tablet Take 2 tablets (650 mg total) by mouth every 6 (six) hours as needed for  headache.     apixaban  (ELIQUIS ) 5 MG TABS tablet Take 1 tablet (5 mg total) by mouth 2 (two) times daily. 60 tablet 3   atorvastatin  (LIPITOR ) 80 MG tablet Take 1 tablet (80 mg total) by mouth daily. 30 tablet 3   busPIRone  (BUSPAR ) 5 MG tablet Take 1 tablet (5 mg total) by mouth 3 (three) times daily as needed (anxiety). 30 tablet 3   Continuous Glucose Receiver (FREESTYLE LIBRE 3 READER) DEVI Use as directed 4 (four) times daily -  before meals and at bedtime. 1 each 1   Continuous Glucose Sensor (FREESTYLE LIBRE 3 SENSOR) MISC Place 1 sensor on the skin every 14 days. Use to check glucose continuously 2 each 3   insulin  aspart (NOVOLOG  FLEXPEN) 100  UNIT/ML FlexPen Inject 10 Units into the skin 3 (three) times daily with meals. 15 mL 11   insulin  glargine (LANTUS  SOLOSTAR) 100 UNIT/ML Solostar Pen Inject 20 Units into the skin 2 (two) times daily. 15 mL 3   Insulin  Pen Needle 32G X 4 MM MISC Use 2 (two) times daily. 100 each 1   levETIRAcetam  (KEPPRA ) 1000 MG tablet Take 1 tablet (1,000 mg total) by mouth 2 (two) times daily. 60 tablet 3   losartan  (COZAAR ) 25 MG tablet Take 1 tablet (25 mg total) by mouth daily. 90 tablet 1   polyethylene glycol powder (MIRALAX ) 17 GM/SCOOP powder Take 17 g by mouth 2 (two) times daily as needed for moderate constipation.     saccharomyces boulardii (FLORASTOR) 250 MG capsule Take 1 capsule (250 mg total) by mouth 2 (two) times daily. 60 capsule 3   No current facility-administered medications on file prior to visit.    Allergies:   Allergies  Allergen Reactions   Dye Fdc Blue [Brilliant Blue Fcf (Fd&C Blue #1)] Hives    MRI   Other Nausea And Vomiting    Zole (antibiotics ending with zole)    Physical Exam General: well developed, well nourished, seated, in no evident distress Head: head normocephalic and atraumatic.   Neck: supple with no carotid or supraclavicular bruits Cardiovascular: regular rate and rhythm, no murmurs Musculoskeletal: no  deformity Skin:  no rash/petichiae Vascular:  Normal pulses all extremities  Neurologic Exam Mental Status: Awake and fully alert. Oriented to place and time. Recent and remote memory intact. Attention span, concentration and fund of knowledge appropriate. Mood and affect appropriate.  Cranial Nerves: Fundoscopic exam reveals sharp disc margins. Pupils equal, briskly reactive to light. Extraocular movements full without nystagmus. Visual fields full to confrontation. Hearing intact. Facial sensation intact.  Left nasolabial fold asymmetry., tongue, palate moves normally and symmetrically.  Motor: Normal bulk and tone. Normal strength in all tested extremity muscles.  Diminished fine finger movements on the left.  Orbits right over left upper extremity.  Tone is increased in the left leg. Sensory.: intact to touch , pinprick , position and vibratory sensation.  Coordination: Rapid alternating movements normal in all extremities. Finger-to-nose and heel-to-shin performed accurately bilaterally. Gait and Station: Arises from chair without difficulty. Stance is normal. Gait demonstrates normal stride length and balance . Able to heel, toe and tandem walk without difficulty.  Reflexes: 1+ and asymmetric and brisker on the left. Toes downgoing.   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  No prior history of hypercoagulability with negative lab work for the same.  Vascular risk factors are diabetes, hypertension, hyperlipidemia , carotid thrombus and obesity     PLAN:I had a long d/w patient about her  recent embolic stroke, and, seizure and carotid artery thrombus, risk for recurrent stroke/TIAs, personally independently reviewed imaging studies and stroke evaluation results and answered questions.Continue Eliquis  (apixaban ) daily  for secondary  stroke prevention and maintain strict control of hypertension with blood pressure goal below 130/90, diabetes with hemoglobin A1c goal below 6.5% and lipids with LDL cholesterol goal below 70 mg/dL. I also advised the patient to eat a healthy diet with plenty of whole grains, cereals, fruits and vegetables, exercise regularly and maintain ideal body weight.  Get repeat EEG for seizure kidney angiogram of carotid arteries to look for resolution of the thrombus and  have happened will consider switching Eliquis  to aspirin .patient advised not to drive for 6 months as per [place]  law followup in the future with my nurse practitioner in 6 months earlier if necessary   I personally spent a total of 50 minutes in the care of the patient today including getting/reviewing separately obtained history, performing a medically appropriate exam/evaluation, counseling and educating, placing orders, referring and communicating with other health care professionals, documenting clinical information in the EHR, independently interpreting results, and coordinating care.        Eather Popp, MD Note: This document was prepared with digital dictation and possible smart phrase technology. Any transcriptional errors that result from this process are unintentional.

## 2024-08-01 NOTE — Patient Instructions (Addendum)
 I had a long d/w patient about her  recent embolic stroke, and, seizure and carotid artery thrombus, risk for recurrent stroke/TIAs, personally independently reviewed imaging studies and stroke evaluation results and answered questions.Continue Eliquis  (apixaban ) daily  for secondary stroke prevention and maintain strict control of hypertension with blood pressure goal below 130/90, diabetes with hemoglobin A1c goal below 6.5% and lipids with LDL cholesterol goal below 70 mg/dL. I also advised the patient to eat a healthy diet with plenty of whole grains, cereals, fruits and vegetables, exercise regularly and maintain ideal body weight.  Get repeat EEG for seizure kidney angiogram of carotid arteries to look for resolution of the thrombus and have happened will consider switching Eliquis  to aspirin .patient advised not to drive for 6 months as per [place]  law followup in the future with my nurse practitioner in 6 months earlier if necessary  Stroke Prevention Some medical conditions and behaviors can lead to a higher chance of having a stroke. You can help prevent a stroke by eating healthy, exercising, not smoking, and managing any medical conditions you have. Stroke is a leading cause of functional impairment. Primary prevention is particularly important because a majority of strokes are first-time events. Stroke changes the lives of not only those who experience a stroke but also their family and other caregivers. How can this condition affect me? A stroke is a medical emergency and should be treated right away. A stroke can lead to brain damage and can sometimes be life-threatening. If a person gets medical treatment right away, there is a better chance of surviving and recovering from a stroke. What can increase my risk? The following medical conditions may increase your risk of a stroke: Cardiovascular disease. High blood pressure (hypertension). Diabetes. High cholesterol. Sickle cell  disease. Blood clotting disorders (hypercoagulable state). Obesity. Sleep disorders (obstructive sleep apnea). Other risk factors include: Being older than age 38. Having a history of blood clots, stroke, or mini-stroke (transient ischemic attack, TIA). Genetic factors, such as race, ethnicity, or a family history of stroke. Smoking cigarettes or using other tobacco products. Taking birth control pills, especially if you also use tobacco. Heavy use of alcohol or drugs, especially cocaine and methamphetamine. Physical inactivity. What actions can I take to prevent this? Manage your health conditions High cholesterol levels. Eating a healthy diet is important for preventing high cholesterol. If cholesterol cannot be managed through diet alone, you may need to take medicines. Take any prescribed medicines to control your cholesterol as told by your health care provider. Hypertension. To reduce your risk of stroke, try to keep your blood pressure below 130/80. Eating a healthy diet and exercising regularly are important for controlling blood pressure. If these steps are not enough to manage your blood pressure, you may need to take medicines. Take any prescribed medicines to control hypertension as told by your health care provider. Ask your health care provider if you should monitor your blood pressure at home. Have your blood pressure checked every year, even if your blood pressure is normal. Blood pressure increases with age and some medical conditions. Diabetes. Eating a healthy diet and exercising regularly are important parts of managing your blood sugar (glucose). If your blood sugar cannot be managed through diet and exercise, you may need to take medicines. Take any prescribed medicines to control your diabetes as told by your health care provider. Get evaluated for obstructive sleep apnea. Talk to your health care provider about getting a sleep evaluation if you snore a  lot or have  excessive sleepiness. Make sure that any other medical conditions you have, such as atrial fibrillation or atherosclerosis, are managed. Nutrition Follow instructions from your health care provider about what to eat or drink to help manage your health condition. These instructions may include: Reducing your daily calorie intake. Limiting how much salt (sodium) you use to 1,500 milligrams (mg) each day. Using only healthy fats for cooking, such as olive oil, canola oil, or sunflower oil. Eating healthy foods. You can do this by: Choosing foods that are high in fiber, such as whole grains, and fresh fruits and vegetables. Eating at least 5 servings of fruits and vegetables a day. Try to fill one-half of your plate with fruits and vegetables at each meal. Choosing lean protein foods, such as lean cuts of meat, poultry without skin, fish, tofu, beans, and nuts. Eating low-fat dairy products. Avoiding foods that are high in sodium. This can help lower blood pressure. Avoiding foods that have saturated fat, trans fat, and cholesterol. This can help prevent high cholesterol. Avoiding processed and prepared foods. Counting your daily carbohydrate intake.  Lifestyle If you drink alcohol: Limit how much you have to: 0-1 drink a day for women who are not pregnant. 0-2 drinks a day for men. Know how much alcohol is in your drink. In the [place] one drink equals one 12 oz bottle of beer ( ), one 5 oz glass of wine ( ), or one 1 oz glass of hard liquor (44mL). Do not use any products that contain nicotine or tobacco. These products include cigarettes, chewing tobacco, and vaping devices, such as e-cigarettes. If you need help quitting, ask your health care provider. Avoid secondhand smoke. Do not use drugs. Activity  Try to stay at a healthy weight. Get at least 30 minutes of exercise on most days, such as: Fast walking. Biking. Swimming. Medicines Take over-the-counter and prescription  medicines only as told by your health care provider. Aspirin or blood thinners (antiplatelets or anticoagulants) may be recommended to reduce your risk of forming blood clots that can lead to stroke. Avoid taking birth control pills. Talk to your health care provider about the risks of taking birth control pills if: You are over 31 years old. You smoke. You get very bad headaches. You have had a blood clot. Where to find more information American Stroke Association: www.strokeassociation.org Get help right away if: You or a loved one has any symptoms of a stroke. BE FAST is an easy way to remember the main warning signs of a stroke: B - Balance. Signs are dizziness, sudden trouble walking, or loss of balance. E - Eyes. Signs are trouble seeing or a sudden change in vision. F - Face. Signs are sudden weakness or numbness of the face, or the face or eyelid drooping on one side. A - Arms. Signs are weakness or numbness in an arm. This happens suddenly and usually on one side of the body. S - Speech. Signs are sudden trouble speaking, slurred speech, or trouble understanding what people say. T - Time. Time to call emergency services. Write down what time symptoms started. You or a loved one has other signs of a stroke, such as: A sudden, severe headache with no known cause. Nausea or vomiting. Seizure. These symptoms may represent a serious problem that is an emergency. Do not wait to see if the symptoms will go away. Get medical help right away. Call your local emergency services (911 in the [place] Do not drive yourself  to the hospital. Summary You can help to prevent a stroke by eating healthy, exercising, not smoking, limiting alcohol intake, and managing any medical conditions you may have. Do not use any products that contain nicotine or tobacco. These include cigarettes, chewing tobacco, and vaping devices, such as e-cigarettes. If you need help quitting, ask your health care  provider. Remember BE FAST for warning signs of a stroke. Get help right away if you or a loved one has any of these signs. This information is not intended to replace advice given to you by your health care provider. Make sure you discuss any questions you have with your health care provider. Document Revised: 07/14/2022 Document Reviewed: 07/14/2022 Elsevier Patient Education  2024 [employer].

## 2024-08-22 ENCOUNTER — Ambulatory Visit
Admission: RE | Admit: 2024-08-22 | Discharge: 2024-08-22 | Disposition: A | Discharge status: Home / Self Care | Payer: MEDICAID | Source: Ambulatory Visit | Attending: Neurology | Admitting: Neurology

## 2024-08-22 ENCOUNTER — Encounter: Payer: Self-pay | Admitting: Family

## 2024-08-22 ENCOUNTER — Telehealth: Payer: Self-pay

## 2024-08-22 ENCOUNTER — Ambulatory Visit: Payer: MEDICAID | Admitting: Family

## 2024-08-22 VITALS — BP 128/68 | HR 91 | Temp 97.6°F | Ht 62.0 in | Wt 150.0 lb

## 2024-08-22 DIAGNOSIS — F411 Generalized anxiety disorder: Secondary | ICD-10-CM | POA: Diagnosis not present

## 2024-08-22 DIAGNOSIS — E1165 Type 2 diabetes mellitus with hyperglycemia: Secondary | ICD-10-CM

## 2024-08-22 DIAGNOSIS — R21 Rash and other nonspecific skin eruption: Secondary | ICD-10-CM

## 2024-08-22 DIAGNOSIS — R7989 Other specified abnormal findings of blood chemistry: Secondary | ICD-10-CM | POA: Diagnosis not present

## 2024-08-22 DIAGNOSIS — Z7985 Long-term (current) use of injectable non-insulin antidiabetic drugs: Secondary | ICD-10-CM

## 2024-08-22 MED ORDER — NOVOLOG FLEXPEN 100 UNIT/ML ~~LOC~~ SOPN: 12.0000 [IU] | PEN_INJECTOR | Freq: Three times a day (TID) | SUBCUTANEOUS | 11 refills | Status: AC

## 2024-08-22 MED ORDER — LANTUS SOLOSTAR 100 UNIT/ML ~~LOC~~ SOPN: PEN_INJECTOR | SUBCUTANEOUS | 5 refills | Status: AC

## 2024-08-22 MED ORDER — INSULIN PEN NEEDLE 32G X 4 MM MISC: 1.0000 | Freq: Two times a day (BID) | 5 refills | Status: AC

## 2024-08-22 MED ORDER — LANTUS SOLOSTAR 100 UNIT/ML ~~LOC~~ SOPN: 20.0000 [IU] | PEN_INJECTOR | Freq: Two times a day (BID) | SUBCUTANEOUS | 5 refills | Status: DC

## 2024-08-22 MED ORDER — BUSPIRONE HCL 5 MG PO TABS: 5.0000 mg | ORAL_TABLET | Freq: Three times a day (TID) | ORAL | 3 refills | Status: AC | PRN

## 2024-08-22 MED ORDER — IOPAMIDOL (ISOVUE-370) INJECTION 76%
75.0000 mL | Freq: Once | INTRAVENOUS | Status: AC | PRN
  Administered 2024-08-22: 75 mL via INTRAVENOUS

## 2024-08-22 NOTE — Patient Instructions (Signed)
 Increase Novolog  from 10 units to 12 units three times with meals.  - May increase Lantus  20 units in the morning to 22 units if blood sugars are grater than 150 consistently to twice daily

## 2024-08-22 NOTE — Telephone Encounter (Signed)
 Clarification : Lantus  inject SQ 20 units in the morning and 22 units at bedtime

## 2024-08-22 NOTE — Telephone Encounter (Signed)
 Per the pharmacy, rx for Lantus  solostar is confusing and needs clarification. Please resubmit with clearer instructions

## 2024-08-22 NOTE — Telephone Encounter (Signed)
 RX updated and re-submitted

## 2024-08-22 NOTE — Progress Notes (Signed)
 "  Provider: Thailan Sava FNP-C   Rayyan Burley, Roxan BROCKS, NP  Patient Care Team: Raylen Tangonan, Roxan BROCKS, NP as PCP - General (Family Medicine)  Extended Emergency Contact Information Primary Emergency Contact: Christenberry,Carolyn Address: [address]          [place], [place] [address] [place] Home Phone: [phone] Mobile Phone: [phone] Relation: Mother Secondary Emergency Contact: [facility] Mobile Phone: [phone] Relation: Uncle Preferred language: English Interpreter needed? No  Code Status:  Full Code  Goals of care: Advanced Directive information    05/20/2024   12:57 PM  Advanced Directives  Does Patient Have a Medical Advance Directive? No  Would patient like information on creating a medical advance directive? No - Patient declined     Chief Complaint  Patient presents with   Medical Management of Chronic Issues    1 month follow up. Requesting antibiotic     Extremity Weakness    Bilateral. She states when she works out for 30 minutes she can only do 10 minutes at a time    History of Present Illness   Terri [name] is a 48 year old female with diabetes mellitus who presents for a one month follow-up to check on her blood sugars.  She has been monitoring her blood sugars from November to the present date. Her blood sugar levels before breakfast have varied, with readings such as 141, 144, 147, 113, 120, 152, and 159. Recently, her levels have been 127, 135, 123, with occasional spikes to 165 and 170. She usually stops eating by 8 PM, especially avoiding carbohydrates.  She is currently taking 22 units of Lantus  at night and 20 units in the morning, along with 10 units of Novolog  three times a day with meals. No symptoms of hypoglycemia such as shakiness. Her A1c was 9.4 in November, improved from 13.7 four months prior. She uses a continuous glucose monitor and has refills for her medications including buspirone , Novolog , Lantus , and  losartan .  She has recurrent boils, particularly on her right breast, which have been present for about two weeks. They are described as draining and sometimes having a foul smell. No fever or chills associated with these boils. The boils are mainly located on the underside of her breast and occasionally leave scar tissue.  She has a concern about her thyroid levels, which were slightly low previously, and is awaiting rechecking of her TSH, T3, and T4 levels.    Past Medical History:  Diagnosis Date   Allergies    nasal congestion/eyes get itchy   DEPRESSION 10/18/2009   DVT (deep vein thrombosis) in pregnancy 2012   In setting of tobacco use/OCP   ENDOMETRIOSIS 10/18/2009   GERD 10/18/2009   History of HPV infection    MIGRAINE HEADACHE 10/18/2009   UNSPECIFIED PERIPHERAL VASCULAR DISEASE 10/18/2009   embolization to right 2nd and 3rd finger   Past Surgical History:  Procedure Laterality Date   APPENDECTOMY     BOWEL RESECTION     r/t endometriosis   OVARIAN CYST REMOVAL     bilateral   TRANSESOPHAGEAL ECHOCARDIOGRAM (CATH LAB) N/A 03/30/2024   Procedure: TRANSESOPHAGEAL ECHOCARDIOGRAM;  Surgeon: Barbaraann Darryle Ned, MD;  Location: [facility] INVASIVE CV LAB;  Service: Cardiovascular;  Laterality: N/A;    Allergies[1]  Allergies as of 08/22/2024       Reactions   Dye Fdc Blue [brilliant Blue Fcf (fd&c Blue #1)] Hives   MRI   Other Nausea And Vomiting   Zole (antibiotics ending  with zole)        Medication List        Accurate as of August 22, 2024  1:36 PM. If you have any questions, ask your nurse or doctor.          acetaminophen  325 MG tablet Commonly known as: TYLENOL  Take 2 tablets (650 mg total) by mouth every 6 (six) hours as needed for headache.   apixaban  5 MG Tabs tablet Commonly known as: ELIQUIS  Take 1 tablet (5 mg total) by mouth 2 (two) times daily.   atorvastatin  80 MG tablet Commonly known as: LIPITOR  Take 1 tablet (80 mg total) by mouth  daily.   busPIRone  5 MG tablet Commonly known as: BUSPAR  Take 1 tablet (5 mg total) by mouth 3 (three) times daily as needed (anxiety).   FreeStyle Libre 3 Reader [place] Use as directed 4 (four) times daily -  before meals and at bedtime.   FreeStyle Libre 3 Sensor Misc Place 1 sensor on the skin every 14 days. Use to check glucose continuously   Insulin  Pen Needle 32G X 4 MM Misc Use 2 (two) times daily.   Lantus  SoloStar 100 UNIT/ML Solostar Pen Generic drug: insulin  glargine Inject 20 Units into the skin 2 (two) times daily. 22 at night   levETIRAcetam  1000 MG tablet Commonly known as: KEPPRA  Take 1 tablet (1,000 mg total) by mouth 2 (two) times daily.   losartan  25 MG tablet Commonly known as: COZAAR  Take 1 tablet (25 mg total) by mouth daily.   NovoLOG  FlexPen 100 UNIT/ML FlexPen Generic drug: insulin  aspart Inject 12 Units into the skin 3 (three) times daily with meals. What changed: how much to take Changed by: Roxan Plough, NP   polyethylene glycol powder 17 GM/SCOOP powder Commonly known as: MiraLax  Take 17 g by mouth 2 (two) times daily as needed for moderate constipation.   saccharomyces boulardii 250 MG capsule Commonly known as: FLORASTOR Take 1 capsule (250 mg total) by mouth 2 (two) times daily.        Review of Systems  Constitutional:  Negative for appetite change, chills, fatigue, fever and unexpected weight change.  HENT:  Negative for congestion, ear discharge, ear pain, hearing loss, nosebleeds, postnasal drip, rhinorrhea, sinus pressure, sinus pain, sneezing, sore throat, tinnitus and trouble swallowing.   Eyes:  Negative for pain, discharge, redness, itching and visual disturbance.  Respiratory:  Negative for cough, chest tightness, shortness of breath and wheezing.   Cardiovascular:  Negative for chest pain, palpitations and leg swelling.  Gastrointestinal:  Negative for abdominal distention, abdominal pain, blood in stool, constipation,  diarrhea, nausea and vomiting.  Endocrine: Negative for cold intolerance, heat intolerance, polydipsia, polyphagia and polyuria.  Genitourinary:  Negative for difficulty urinating, dysuria, flank pain, frequency and urgency.  Musculoskeletal:  Negative for arthralgias, back pain, gait problem, joint swelling, myalgias, neck pain and neck stiffness.  Skin:  Positive for rash. Negative for color change, pallor and wound.       Recurrent boils on breast/armpit   Neurological:  Negative for dizziness, syncope, speech difficulty, weakness, light-headedness, numbness and headaches.  Hematological:  Does not bruise/bleed easily.  Psychiatric/Behavioral:  Negative for agitation, behavioral problems, confusion, hallucinations and sleep disturbance. The patient is not nervous/anxious.     Immunization History  Administered Date(s) Administered   INFLUENZA, HIGH DOSE SEASONAL PF 05/20/2024   Influenza,inj,Quad PF,6+ Mos 07/12/2019   PNEUMOCOCCAL CONJUGATE-20 05/20/2024   Pneumococcal Polysaccharide-23 08/26/2007, 07/12/2019   Td 08/25/1998   Tdap 07/28/2011,  06/06/2024   Pertinent  Health Maintenance Due  Topic Date Due   FOOT EXAM  Never done   OPHTHALMOLOGY EXAM  Never done   Colonoscopy  Never done   HEMOGLOBIN A1C  01/08/2025   Mammogram  07/01/2026   Influenza Vaccine  Completed      07/12/2019    9:00 AM 04/29/2024    1:31 PM 05/31/2024    1:20 PM 06/06/2024   10:40 AM 08/22/2024   12:46 PM  Fall Risk  Falls in the past year?  1 0 0 0  Was there an injury with Fall?  1   0  0  Was there an injury with Fall? - Comments  bruises      Fall Risk Category Calculator  3  0 0  (RETIRED) Patient Fall Risk Level       Patient at Risk for Falls Due to    No Fall Risks No Fall Risks  Fall risk Follow up    Falls evaluation completed Falls evaluation completed     Information is confidential and restricted. Go to Review Flowsheets to unlock data.   Data saved with a previous flowsheet row  definition   Functional Status Survey:    Vitals:   08/22/24 1248  BP: 128/68  Pulse: 91  Temp: 97.6 F (36.4 C)  TempSrc: Temporal  SpO2: 99%  Weight: 150 lb (68 kg)  Height: 5' 2 (1.575 m)   Body mass index is 27.44 kg/m. Physical Exam Physical Exam   GENERAL: Alert, cooperative, well developed, no acute distress. HEENT: Normocephalic, normal oropharynx, moist mucous membranes. CHEST: Clear to auscultation bilaterally, no wheezes, rhonchi, or crackles. No tenderness on back palpation. CARDIOVASCULAR: Normal heart rate and rhythm, S1 and S2 normal without murmurs. BREAST: Right breast with pink skin where it had opened, lesion healed, no hardness on palpation. Left breast with scar tissue remaining.No erythema ABDOMEN: Soft, non-tender, non-distended, without organomegaly, normal bowel sounds. EXTREMITIES: No cyanosis or edema. NEUROLOGICAL: Cranial nerves grossly intact, moves all extremities without gross motor or sensory deficit.      Labs reviewed: Recent Labs    03/29/24 0718 03/30/24 0453 03/31/24 0337 04/01/24 0527 04/04/24 0534 04/07/24 0435 07/11/24 1536  NA 136 137 138   < > 138 138 140  K 3.2* 4.2 4.5   < > 4.0 3.5 3.5  CL 104 108 106   < > 111 112* 108  CO2 24 21* 24   < > 19* 18* 24  GLUCOSE 238* 212* 147*   < > 152* 149* 102  BUN <5* <5* 5*   < > 9 10 17   CREATININE 0.49 0.65 0.67   < > 0.68 0.64 0.92  CALCIUM  8.8* 8.8* 8.8*   < > 9.0 9.2 9.7  MG 1.7 2.0 1.9  --   --   --   --   PHOS 4.4 4.3 5.1*  --   --   --   --    < > = values in this interval not displayed.   Recent Labs    03/28/24 0524 03/30/24 0453 03/31/24 0337 04/01/24 0527 07/11/24 1536  AST 22 17  --  21 12  ALT 10 12  --  12 14  ALKPHOS 93 67  --  68  --   BILITOT 0.7 0.6  --  0.9 0.5  PROT 6.8 6.2*  --  6.2* 7.6  ALBUMIN 3.2* 2.7* 2.7* 2.8*  --    Recent Labs  03/28/24 0538 03/28/24 0546 04/01/24 0527 04/04/24 0534 07/11/24 1536  WBC 14.0*   < > 7.4 8.6 8.1   NEUTROABS 9.6*  --  4.2  --  3,791  HGB 13.7   < > 13.2 13.4 13.1  HCT 40.9   < > 40.6 39.6 39.0  MCV 88.1   < > 88.8 88.0 85.2  PLT 341   < > 320 323 330   < > = values in this interval not displayed.   Lab Results  Component Value Date   TSH 0.04 (L) 07/11/2024   Lab Results  Component Value Date   HGBA1C 9.4 (H) 07/11/2024   Lab Results  Component Value Date   CHOL 99 07/11/2024   HDL 27 (L) 07/11/2024   LDLCALC 45 07/11/2024   LDLDIRECT 132 (H) 03/29/2024   TRIG 200 (H) 07/11/2024   CHOLHDL 3.7 07/11/2024    Significant Diagnostic Results in last 30 days:  No results found.  Assessment/Plan  Type 2 diabetes mellitus with hyperglycemia Blood sugars are generally well-controlled with occasional elevations. Recent A1c improved to 9.4 from 13.7 four months ago. Goal is to reduce A1c below 8 to prevent complications such as retinopathy, nephropathy, and cardiovascular disease. - dietary modification and exercise advised  - Increased Novolog  to 12 units three times with meals. - If morning blood sugars remain in the 150s or 160s, increase Lantus  to 22 units twice a day. - Monitor blood sugars and report via MyChart. - Refilled Novolog , Lantus , and insulin  pen needles.  Recurrent cutaneous abscesses of the breast Recurrent boils on the right breast, some with drainage and odor. No current infection or fever. Previous boils have healed. High blood sugars may contribute to recurrence. - Referred to dermatologist for evaluation of recurrent boils. - Maintain blood sugar control to reduce recurrence of boils.  Abnormal thyroid function tests Previous thyroid function tests indicated slightly low levels. Plan to recheck TSH, T3, and T4 to assess for thyroid dysfunction. - Ordered TSH, T3, and T4 tests.   Family/ staff Communication: Reviewed plan of care with patient verbalized understanding   Labs/tests ordered: TSH,T3 and T4   Next Appointment : Return if symptoms  worsen or fail to improve.   Spent 20 minutes of Face to face and non-face to face with patient  >50% time spent counseling; reviewing medical record; tests; labs; documentation and developing future plan of care.   Roxan JAYSON Plough, NP       [1]  Allergies Allergen Reactions   Dye Fdc Blue [Brilliant Blue Fcf (Fd&C Blue #1)] Hives    MRI   Other Nausea And Vomiting    Zole (antibiotics ending with zole)   "

## 2024-08-23 ENCOUNTER — Ambulatory Visit: Payer: MEDICAID | Admitting: Neurology

## 2024-08-23 DIAGNOSIS — R569 Unspecified convulsions: Secondary | ICD-10-CM | POA: Diagnosis not present

## 2024-08-23 LAB — TSH: TSH: 0.01 m[IU]/L — ABNORMAL LOW

## 2024-08-23 LAB — T4, FREE: Free T4: 1.4 ng/dL (ref 0.8–1.8)

## 2024-08-23 LAB — T3, FREE: T3, Free: 3 pg/mL (ref 2.3–4.2)

## 2024-08-24 ENCOUNTER — Ambulatory Visit: Payer: Self-pay | Admitting: Family

## 2024-08-24 DIAGNOSIS — R7989 Other specified abnormal findings of blood chemistry: Secondary | ICD-10-CM

## 2024-08-29 ENCOUNTER — Telehealth: Payer: Self-pay | Admitting: *Deleted

## 2024-08-29 MED ORDER — METHIMAZOLE 5 MG PO TABS: 5.0000 mg | ORAL_TABLET | Freq: Every day | ORAL | 3 refills | Status: AC

## 2024-08-29 NOTE — Telephone Encounter (Signed)
 Copied from CRM [id number]. Topic: Clinical - Prescription Issue >> Aug 29, 2024  8:53 AM Cherylann RAMAN wrote: Reason for CRM: Patient called and stated that she would like to try the  methimazole  5 mg tablet 1 by mouth daily for hyperthyroidism before she sees an endocrinologist. Please advise. Patient can be contact at [phone] for additional information.

## 2024-08-29 NOTE — Telephone Encounter (Signed)
 Copied from CRM [id number]. Topic: Referral - Question >> Aug 29, 2024  9:30 AM Antonio DEL wrote: Reason for CRM: Patient is calling to request referral for Opthalmology be sent somewhere else because [facility] does not accept her insurance. Preferably in [place].

## 2024-08-29 NOTE — Telephone Encounter (Signed)
 May forward ophthalmology referral as requested by patient.

## 2024-08-29 NOTE — Telephone Encounter (Signed)
 Forwarded to [facility] (Referrals)

## 2024-08-31 ENCOUNTER — Ambulatory Visit: Payer: MEDICAID | Admitting: Family

## 2024-09-01 NOTE — Telephone Encounter (Signed)
 Thank you for the update!

## 2024-09-08 ENCOUNTER — Ambulatory Visit: Payer: Self-pay | Admitting: Neurology

## 2024-09-14 ENCOUNTER — Ambulatory Visit: Payer: MEDICAID | Admitting: Gastroenterology

## 2024-09-27 ENCOUNTER — Other Ambulatory Visit: Payer: MEDICAID

## 2024-09-27 DIAGNOSIS — R7989 Other specified abnormal findings of blood chemistry: Secondary | ICD-10-CM

## 2024-09-28 LAB — TSH: TSH: 32.2 m[IU]/L — ABNORMAL HIGH

## 2024-09-28 LAB — T4, FREE: Free T4: 0.5 ng/dL — ABNORMAL LOW (ref 0.8–1.8)

## 2024-10-26 ENCOUNTER — Ambulatory Visit: Payer: MEDICAID | Admitting: Podiatry

## 2024-10-28 ENCOUNTER — Ambulatory Visit: Payer: MEDICAID | Admitting: Adult Health

## 2024-11-03 ENCOUNTER — Other Ambulatory Visit

## 2024-11-07 ENCOUNTER — Ambulatory Visit: Admitting: Family
